# Patient Record
Sex: Female | Born: 1960 | Race: White | Hispanic: No | Smoking: Former smoker
Health system: Southern US, Community
[De-identification: ages and names within clinical notes are randomized; demographics above are authoritative.]

## PROBLEM LIST (undated history)

## (undated) DIAGNOSIS — E7800 Pure hypercholesterolemia, unspecified: Secondary | ICD-10-CM

## (undated) DIAGNOSIS — G43909 Migraine, unspecified, not intractable, without status migrainosus: Secondary | ICD-10-CM

## (undated) DIAGNOSIS — M79606 Pain in leg, unspecified: Secondary | ICD-10-CM

## (undated) DIAGNOSIS — F410 Panic disorder [episodic paroxysmal anxiety] without agoraphobia: Secondary | ICD-10-CM

## (undated) DIAGNOSIS — K219 Gastro-esophageal reflux disease without esophagitis: Secondary | ICD-10-CM

## (undated) DIAGNOSIS — I8390 Asymptomatic varicose veins of unspecified lower extremity: Secondary | ICD-10-CM

## (undated) DIAGNOSIS — F32A Depression, unspecified: Secondary | ICD-10-CM

## (undated) DIAGNOSIS — K449 Diaphragmatic hernia without obstruction or gangrene: Secondary | ICD-10-CM

## (undated) DIAGNOSIS — F329 Major depressive disorder, single episode, unspecified: Secondary | ICD-10-CM

## (undated) DIAGNOSIS — F419 Anxiety disorder, unspecified: Secondary | ICD-10-CM

## (undated) DIAGNOSIS — D649 Anemia, unspecified: Secondary | ICD-10-CM

## (undated) HISTORY — PX: HERNIA REPAIR: SHX51

## (undated) HISTORY — DX: Pain in leg, unspecified: M79.606

## (undated) HISTORY — DX: Anemia, unspecified: D64.9

## (undated) HISTORY — DX: Asymptomatic varicose veins of unspecified lower extremity: I83.90

## (undated) HISTORY — DX: Migraine, unspecified, not intractable, without status migrainosus: G43.909

## (undated) HISTORY — DX: Pure hypercholesterolemia, unspecified: E78.00

## (undated) HISTORY — PX: TUBAL LIGATION: SHX77

---

## 1997-09-04 ENCOUNTER — Emergency Department (HOSPITAL_COMMUNITY): Admission: EM | Admit: 1997-09-04 | Discharge: 1997-09-04 | Payer: Self-pay | Admitting: Emergency Medicine

## 2001-04-26 ENCOUNTER — Other Ambulatory Visit: Admission: RE | Admit: 2001-04-26 | Discharge: 2001-04-26 | Payer: Self-pay | Admitting: Internal Medicine

## 2001-08-11 ENCOUNTER — Encounter: Payer: Self-pay | Admitting: Internal Medicine

## 2001-08-11 ENCOUNTER — Ambulatory Visit (HOSPITAL_COMMUNITY): Admission: RE | Admit: 2001-08-11 | Discharge: 2001-08-11 | Payer: Self-pay | Admitting: Internal Medicine

## 2004-02-08 ENCOUNTER — Ambulatory Visit (HOSPITAL_COMMUNITY): Admission: RE | Admit: 2004-02-08 | Discharge: 2004-02-08 | Payer: Self-pay | Admitting: Family Medicine

## 2006-06-02 HISTORY — PX: COLONOSCOPY: SHX174

## 2006-06-02 HISTORY — PX: ESOPHAGOGASTRODUODENOSCOPY: SHX1529

## 2008-01-27 ENCOUNTER — Ambulatory Visit: Payer: Self-pay | Admitting: Internal Medicine

## 2008-02-08 ENCOUNTER — Ambulatory Visit (HOSPITAL_COMMUNITY): Admission: RE | Admit: 2008-02-08 | Discharge: 2008-02-08 | Payer: Self-pay | Admitting: Internal Medicine

## 2008-02-08 ENCOUNTER — Ambulatory Visit: Payer: Self-pay | Admitting: Internal Medicine

## 2008-02-08 HISTORY — PX: ESOPHAGOGASTRODUODENOSCOPY: SHX1529

## 2008-02-10 ENCOUNTER — Ambulatory Visit (HOSPITAL_COMMUNITY): Admission: RE | Admit: 2008-02-10 | Discharge: 2008-02-10 | Payer: Self-pay | Admitting: Internal Medicine

## 2008-06-02 HISTORY — PX: HIATAL HERNIA REPAIR: SHX195

## 2009-01-18 ENCOUNTER — Other Ambulatory Visit: Payer: Self-pay | Admitting: Emergency Medicine

## 2009-01-18 ENCOUNTER — Inpatient Hospital Stay (HOSPITAL_COMMUNITY): Admission: RE | Admit: 2009-01-18 | Discharge: 2009-01-21 | Payer: Self-pay | Admitting: *Deleted

## 2009-01-18 ENCOUNTER — Ambulatory Visit: Payer: Self-pay | Admitting: *Deleted

## 2009-02-08 ENCOUNTER — Other Ambulatory Visit: Admission: RE | Admit: 2009-02-08 | Discharge: 2009-02-08 | Payer: Self-pay | Admitting: Obstetrics and Gynecology

## 2009-02-14 ENCOUNTER — Ambulatory Visit (HOSPITAL_COMMUNITY): Admission: RE | Admit: 2009-02-14 | Discharge: 2009-02-14 | Payer: Self-pay | Admitting: Obstetrics & Gynecology

## 2010-03-05 ENCOUNTER — Ambulatory Visit (HOSPITAL_COMMUNITY): Admission: RE | Admit: 2010-03-05 | Discharge: 2010-03-05 | Payer: Self-pay | Admitting: Internal Medicine

## 2010-04-09 ENCOUNTER — Ambulatory Visit (HOSPITAL_COMMUNITY): Admission: RE | Admit: 2010-04-09 | Discharge: 2010-04-09 | Payer: Self-pay | Admitting: Obstetrics & Gynecology

## 2010-04-18 ENCOUNTER — Other Ambulatory Visit: Admission: RE | Admit: 2010-04-18 | Discharge: 2010-04-18 | Payer: Self-pay | Admitting: Obstetrics and Gynecology

## 2010-06-23 ENCOUNTER — Encounter: Payer: Self-pay | Admitting: Internal Medicine

## 2010-09-07 LAB — RAPID URINE DRUG SCREEN, HOSP PERFORMED
Amphetamines: NOT DETECTED
Barbiturates: NOT DETECTED
Benzodiazepines: POSITIVE — AB
Cocaine: NOT DETECTED
Opiates: NOT DETECTED
Tetrahydrocannabinol: NOT DETECTED

## 2010-09-07 LAB — SALICYLATE LEVEL: Salicylate Lvl: <4 mg/dL (ref 2.8–20.0)

## 2010-09-07 LAB — CBC
HCT: 42.6 % (ref 36.0–46.0)
Hemoglobin: 14.7 g/dL (ref 12.0–15.0)
MCHC: 34.5 g/dL (ref 30.0–36.0)
MCV: 90.6 fL (ref 78.0–100.0)
Platelets: 153 10*3/uL (ref 150–400)
RBC: 4.71 MIL/uL (ref 3.87–5.11)
RDW: 14.3 % (ref 11.5–15.5)
WBC: 4.3 10*3/uL (ref 4.0–10.5)

## 2010-09-07 LAB — BASIC METABOLIC PANEL
BUN: 7 mg/dL (ref 6–23)
CO2: 30 mEq/L (ref 19–32)
Chloride: 111 mEq/L (ref 96–112)
Creatinine, Ser: 0.93 mg/dL (ref 0.4–1.2)
Glucose, Bld: 121 mg/dL — ABNORMAL HIGH (ref 70–99)

## 2010-09-07 LAB — DIFFERENTIAL
Basophils Relative: 1 % (ref 0–1)
Eosinophils Absolute: 0 10*3/uL (ref 0.0–0.7)
Lymphocytes Relative: 46 % (ref 12–46)
Monocytes Relative: 5 % (ref 3–12)
Neutrophils Relative %: 47 % (ref 43–77)

## 2010-09-07 LAB — ETHANOL: Alcohol, Ethyl (B): 139 mg/dL — ABNORMAL HIGH (ref 0–10)

## 2010-10-15 NOTE — Op Note (Signed)
NAMERAFAELLA, KOLE                  ACCOUNT NO.:  [id number]   MEDICAL RECORD NO.:  [id number]          PATIENT TYPE:  AMB   LOCATION:  DAY                           FACILITY:  [facility]   PHYSICIAN:  R. Roetta Sessions, M.D. DATE OF BIRTH:  05-07-1961   DATE OF PROCEDURE:  02/08/2008  DATE OF DISCHARGE:                               OPERATIVE REPORT   INDICATIONS FOR PROCEDURE:  A 50 year old lady with a history of chronic  gastroesophageal reflux disease, atypical chest pain, she denies  dysphagia, history of a hiatal hernia.  Risks, benefits, and  alternatives have been reviewed previously and again at the bedside.  Questions answered and all parties agreeable.   PROCEDURE NOTE:  O2 saturation, blood pressure, pulse, and respirations  were monitored throughout the entire procedure.  Conscious sedation,  Versed 8 mg IV, Demerol 200 mg IV, Phenergan 12.5 mg IV diluted slow IV  push to augment conscious sedation.  Cetacaine spray for topical  pharyngeal anesthesia.   INSTRUMENT:  Pentax video chip system.   FINDINGS:  Examination of the tubular esophagus revealed a prominent  Schatzki's ring, otherwise esophageal mucosa appeared normal.  EG  junction easily traversed into the stomach.  All gastric cavity was  emptied and insufflated well with air.  Thorough examination of the  gastric mucosa including retroflexed view of the proximal stomach,  esophagogastric junction was undertaken.  The patient had a large  diaphragmatic hernia.  The mucosa in the hernia sac appeared normal as  did the mucosa down into the antrum and the remainder of the gastric  mucosa.  There was a second area of gastric mucosal herniation up behind  the diaphragmatic hiatus which revealed a rather large compartment with  cardiac pulsations on the other side of this segment of herniated  gastric pouch consistent with paraesophageal hernia.  Pylorus is patent,  easily traversed.  Examination of the bulb and the second  portion  revealed no abnormalities.   THERAPEUTIC/DIAGNOSTIC MANEUVERS PERFORMED:  None.   The patient tolerated the procedure well.   FINDINGS:  1. Normal esophageal mucosa aside from Schatzki's ring not manipulated      (the patient not dysphagic).  2. 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   I suspect Ms. Mckissic upper abdominal and chest pain are secondary to her  hernias.   RECOMMENDATIONS:  We will proceed with an upper GI series to confirm  today's findings.  I feel she will likely need to undergo surgical  repair of these hernias in the near future, but further recommendations  to follow pending review of the upper GI series.      Jonathon Bellows, M.D.  Electronically Signed     RMR/MEDQ  D:  02/08/2008  T:  02/09/2008  Job:  [id number]   cc:   Kirstie Peri, MD  Fax: [phone]

## 2010-10-15 NOTE — Consult Note (Signed)
NAMEJACQLYN, MAROLF                  ACCOUNT NO.:  [id number]   MEDICAL RECORD NO.:  [id number]          PATIENT TYPE:  AMB   LOCATION:  DAY                           FACILITY:  [facility]   PHYSICIAN:  R. Roetta Sessions, M.D. DATE OF BIRTH:  11/12/1950   DATE OF CONSULTATION:  DATE OF DISCHARGE:                                 CONSULTATION   REQUESTING PHYSICIAN:  Kirstie Peri, MD.   REASON FOR CONSULTATION:  Atypical chest pain.   HISTORY OF PRESENT ILLNESS:  Ms. Meas is a 50 year old Caucasian  female.  For the last 2 to 3 months, she has had a pressure in her left  upper anterior chest.  She describes as a discomfort.  She feels as  though it may be muscle spasms.  It does radiate up to her left  shoulder.  She feels that she needs to massage her epigastrium.  She has  a lot of borborygmus.  She tells me she feels movement in her abdomen.  She has history of hiatal hernia.  She notes that her chest pressure and  pain is worse when she swallows solid foods, especially foods like  chicken.  Her weight has remained stable.  She denies any heartburn and  indigestion so long as she takes her Nexium 40 mg b.i.d.  She has been  on this regimen earlier this year, prior to this she was on once daily  therapy.  She denies any nausea or vomiting.  She denies any anorexia.  Her bowel movements has been soft and brown.  She denies any diarrhea or  constipation.  She denies rectal bleeding or melena.  She did have an  abdominal ultrasound on January 26, 2008.  Her gallbladder was normal.  There is no evidence of cholelithiasis.  Common bile duct was normal.  Liver was normal.  She had an EGD and colonoscopy by Dr. Karilyn Cota in June  2008.  She was found to have superficial ulceration at the GE junction  and large hiatal hernia with dependent segment on the left side with  some food debris and coffee-ground material, swollen and erythematous  folds, and few antral erosions.  Her colonoscopy was normal.   These  tests were done at the time she was hospitalized with profound iron  deficiency anemia.  She had a hemoglobin of 4.2.   PAST MEDICAL AND SURGICAL HISTORY:  Profound iron deficiency anemia  requiring hospitalization and transfusion, EGD and colonoscopy by Dr.  Karilyn Cota as described above in 2008, bursitis of left leg, depression,  anxiety, and insomnia.   CURRENT MEDICATIONS:  1. Darvocet-N 100 b.i.d.  2. Poly iron once daily.  3. Nexium 40 mg b.i.d.  4. Cymbalta 60 mg daily.  5. Clonazepam 1 mg t.i.d.  6. Trazodone 50 mg at bedtime.  7. Provera 10 mg b.i.d. for 10 days for vaginal bleeding.   ALLERGIES:  No known drug allergies.   FAMILY HISTORY:  There is no known family history of colorectal  carcinoma or chronic GI problems.  Mother is relatively healthy.  Father  deceased secondary to tractor  accident.  She has several healthy  siblings.   SOCIAL HISTORY:  Ms. Rego is single.  She has 3 children, 1 is grown, 1  resides in a group home, and the other is at home with her.  She is  disabled.  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  She has remote  history of tobacco use.  She denies any drug use.  She denies any  alcohol use.   REVIEW OF SYSTEMS:  See HPI.  GYN:  She has had irregular vaginal  bleeding, is quite heavy at times.  She has been started on Provera  through her physician.  Her last menstrual period was around January 12, 2008.   PHYSICAL EXAMINATION:  VITAL SIGNS:  Weight 217 pounds, height 67  inches, temperature 98.3, blood pressure 114/72, and pulse 84.  GENERAL:  She is an obese Caucasian female who is alert, oriented,  pleasant, and cooperative, in no acute distress.  HEENT:  Sclerae clear and nonicteric.  Conjunctivae pink.  Oropharynx  pink and moist without any lesions.  NECK:  Supple without mass or thyromegaly.  CHEST:  Heart regular rate and rhythm.  Normal S1 and S2 without any  murmurs, clicks, rubs, or gallops.  LUNGS:  Clear auscultation  bilaterally.  ABDOMEN:  Protuberant with positive bowel sounds x4.  No bruits  auscultated.  Soft, nontender, and nondistended without palpable mass or  hepatosplenomegaly.  No rebound, tenderness, or guarding.  EXTREMITIES:  Without clubbing or edema.   IMPRESSION:  Ms. Deringer is a 50 year old Caucasian female.  She has a  history of chronic gastroesophageal reflux disease.  She is having  atypical chest pain.  Last EGD showed a hiatal hernia.  Also, she was  noted to have ulceration of the GE junction and coffee-ground material  with swollen and erythematous folds of her large hiatal hernia.  At that  time, she was hospitalized with profound anemia.  I suspect that her  hiatal hernia may be symptomatic and she may also have persistent ulcer  in this area as well as she is going to require further evaluation.   PLAN:  1. We will check CBC today.  2. We will schedule EGD with Dr. Jena Gauss in the near future.  I      discussed the procedure including risks and benefits included but      not limited to bleeding, infection, perforation, and drug reaction.      She agrees and the planned consent will be obtained.  3. She should continue Nexium 40 mg b.i.d.   Thank you Dr. Sherryll Burger for allowing [place] to participate in the care of Ms.  Pevey.      Lorenza Burton, N.P.      Jonathon Bellows, M.D.  Electronically Signed    KJ/MEDQ  D:  01/27/2008  T:  01/28/2008  Job:  [id number]

## 2010-10-15 NOTE — H&P (Signed)
[name], Barrera NO.:  [id number]   MEDICAL RECORD NO.:  [id number]         PATIENT TYPE:  BIPS   LOCATION:                                FACILITY:  [facility]   PHYSICIAN:  Geoffery Lyons, M.D.      DATE OF BIRTH:  Dec 23, 1960   DATE OF ADMISSION:  01/18/2009  DATE OF DISCHARGE:  01/21/2009                       PSYCHIATRIC ADMISSION ASSESSMENT   A 50 year old female voluntarily admitted on January 18, 2009.   HISTORY OF PRESENT ILLNESS:  The patient was assessed in the emergency  department after being seen for an overdose and intoxication.  The  patient apparently had taken 50 mg of Phenergan and 300 mg of trazodone,  stating that she had a few drinks.  She has a history of alcohol use and  states that she just relapsed recently.  She reports stressors going on  in her life but adamantly denies any suicidal thoughts.   PAST PSYCHIATRIC HISTORY:  This is the first admission to the [facility].  Sees Dr. Thomasena Edis at [facility] for her depression and  bipolar disorder.  Has had no other psychiatric admissions.   SOCIAL HISTORY:  She is a 50 year old female, currently in a  relationship, and has a 57 year old son.  Is on disability.   FAMILY HISTORY:  None.   ALCOHOL AND DRUG HISTORY:  The patient has a history of alcohol use.  Was prescribed Campral.  Denies any substance use but was intoxicated on  admission to the emergency department with a blood alcohol level of 229.   PRIMARY CARE Yatzari Jonsson:  Dr. Sherryll Burger.   MEDICAL PROBLEMS:  History of acid reflux.   MEDICATIONS:  The patient lists:  1. Trazodone 50 mg at bedtime.  2. Nexium 40 mg daily.  3. Klonopin 1 mg t.i.d.  4. Darvocet twice a day p.r.n. for pain.  5. Campral daily.  6. Cymbalta 60 mg daily.   DRUG ALLERGIES:  ASPIRIN.   PHYSICAL EXAMINATION:  This is a middle-aged female, resting in bed with  chief complaint of being tired.  She appears somewhat unkempt and does  appear sleepy.  She  offers no complaints; however, emergency department  records indicate the patient was intoxicated with her breath smelling of  alcohol.   LABORATORY DATA:  Shows alcohol level of 229.  Acetaminophen level of  less than 10.  Potassium 3.1.  Salicylate level less than 4.  Urine drug  screen is positive for benzodiazepines.  CBC within normal limits, and  pregnancy test is negative.   MENTAL STATUS EXAM:  The patient again is in the bed, sleepy but  agreeable to talk with good eye contact.  Speech is clear, normal pace  and tone.  The patient's mood is neutral.  Affect:  She smiles and is  pleasant, agreeable to gathering more history and clarifying situations.  Thought process coherent, goal directed.  Denies any suicidal thoughts.  Cognitive function intact.  Her memory appears intact.  Judgment and  insight fair.   ASSESSMENT:  AXIS I:  Depressive disorder.  Alcohol abuse, rule out  dependence.  AXIS II:  Deferred.  AXIS III:  Acid reflux.  AXIS IV:  Deferred at this time.  AXIS V:  Current is 35-40.   PLAN:  1. Contract for safety.  2. Stabilize mood and thinking.  3. We will discontinue her Klonopin which was discussed with the      patient.  Will put patient on Librium protocol and encourage      fluids.  The patient will be in the dual diagnosis program.  We      will identify her support and clarify safety issues at home.  Will      also contact her pharmacy to clarify other medications.  We will      continue with her Cymbalta.  4. Her tentative length of stay at this time is 3-4 days.      Landry Corporal, N.P.      Geoffery Lyons, M.D.  Electronically Signed    JO/MEDQ  D:  01/19/2009  T:  01/19/2009  Job:  [id number]

## 2010-12-10 DIAGNOSIS — R072 Precordial pain: Secondary | ICD-10-CM

## 2011-01-03 LAB — CBC
HCT: 43 %
Hemoglobin: 14.5 g/dL (ref 12.0–16.0)

## 2011-01-03 LAB — COMPREHENSIVE METABOLIC PANEL
AST: 20 U/L
Albumin: 4.2
Calcium: 9.8 mg/dL
Creat: 1
Potassium: 4.3 mmol/L

## 2011-01-22 ENCOUNTER — Emergency Department (HOSPITAL_COMMUNITY)
Admission: EM | Admit: 2011-01-22 | Discharge: 2011-01-22 | Disposition: A | Discharge status: Home / Self Care | Payer: Medicaid Other | Attending: Emergency Medicine | Admitting: Emergency Medicine

## 2011-01-22 ENCOUNTER — Encounter: Payer: Self-pay | Admitting: *Deleted

## 2011-01-22 ENCOUNTER — Other Ambulatory Visit: Payer: Self-pay

## 2011-01-22 ENCOUNTER — Emergency Department (HOSPITAL_COMMUNITY): Payer: Medicaid Other

## 2011-01-22 DIAGNOSIS — F410 Panic disorder [episodic paroxysmal anxiety] without agoraphobia: Secondary | ICD-10-CM | POA: Insufficient documentation

## 2011-01-22 DIAGNOSIS — K449 Diaphragmatic hernia without obstruction or gangrene: Secondary | ICD-10-CM | POA: Insufficient documentation

## 2011-01-22 DIAGNOSIS — Z79899 Other long term (current) drug therapy: Secondary | ICD-10-CM | POA: Insufficient documentation

## 2011-01-22 DIAGNOSIS — F341 Dysthymic disorder: Secondary | ICD-10-CM | POA: Insufficient documentation

## 2011-01-22 DIAGNOSIS — R079 Chest pain, unspecified: Secondary | ICD-10-CM | POA: Insufficient documentation

## 2011-01-22 DIAGNOSIS — Z87891 Personal history of nicotine dependence: Secondary | ICD-10-CM | POA: Insufficient documentation

## 2011-01-22 DIAGNOSIS — E7800 Pure hypercholesterolemia, unspecified: Secondary | ICD-10-CM | POA: Insufficient documentation

## 2011-01-22 DIAGNOSIS — R0789 Other chest pain: Secondary | ICD-10-CM

## 2011-01-22 HISTORY — DX: Major depressive disorder, single episode, unspecified: F32.9

## 2011-01-22 HISTORY — DX: Anxiety disorder, unspecified: F41.9

## 2011-01-22 HISTORY — DX: Depression, unspecified: F32.A

## 2011-01-22 HISTORY — DX: Diaphragmatic hernia without obstruction or gangrene: K44.9

## 2011-01-22 HISTORY — DX: Panic disorder (episodic paroxysmal anxiety): F41.0

## 2011-01-22 LAB — CBC
Hemoglobin: 14.5 g/dL (ref 12.0–15.0)
MCHC: 33.6 g/dL (ref 30.0–36.0)
RBC: 4.64 MIL/uL (ref 3.87–5.11)
WBC: 8.6 10*3/uL (ref 4.0–10.5)

## 2011-01-22 LAB — BASIC METABOLIC PANEL
CO2: 29 mEq/L (ref 19–32)
Chloride: 100 mEq/L (ref 96–112)
GFR calc non Af Amer: >60 mL/min (ref >60)
Glucose, Bld: 94 mg/dL (ref 70–99)
Potassium: 3.6 mEq/L (ref 3.5–5.1)
Sodium: 139 mEq/L (ref 135–145)

## 2011-01-22 LAB — CARDIAC PANEL(CRET KIN+CKTOT+MB+TROPI)
Relative Index: INVALID (ref 0.0–2.5)
Troponin I: <0.3 ng/mL (ref <0.30)

## 2011-01-22 NOTE — ED Notes (Signed)
Pt c/o central chest pain that started 45 mins pta; pt has hx of hiatal hernia and is unsure if this pain is related

## 2011-01-22 NOTE — ED Notes (Signed)
Pt self ambulated out with a steady gait

## 2011-01-22 NOTE — ED Notes (Signed)
Pt arrived via EMS. Pt reports at present time she is not experiencing CP.  States that she was sitting on the couch at home, resting, when she experienced sudden sharp pain in mid chest, some nausea and SOB.  No distress noted at this time.

## 2011-01-22 NOTE — ED Notes (Signed)
Call placed to pt's emergency contact person listed. Rebecca Barrera.  Left message stating that pt was to be discharged.  Pt arrived via EMS, but has no medical need at this time requiring transportation back by ambulance.

## 2011-01-22 NOTE — ED Provider Notes (Signed)
Scribed for Felisa Bonier, MD, the patient was seen in room 14. This chart was scribed by Jannette Fogo. This patient's care was started at 21:02.   CSN: [id number] Arrival date & time: 01/22/2011  7:23 PM  Chief Complaint  Patient presents with  . Chest Pain   HPI Rebecca Barrera is a 50 y.o. female with a history of hiatal hernia and high cholesterol who presents to the Emergency Department complaining of substernal chest pain with associated nausea. Patient states she developed chest "tightness" during rest, approximately 2 hours after eating. She denies any pain radiation but reports associated nausea and difficulty breathing due to tightness. Chest pain lasted for ~45 minutes and "it was unbearable" but en route to the ED it resolved and patient is currently asymptomatic. She denies any associated diaphoresis or vomiting, and pain was not exacerbated by swallowing or relieved by positioning.  Patient reports a history of similar symptoms with hiatal hernia in the past requiring repair. She has an appointment with her GI next week. She also reports a negative stress test ~1 month ago. She denies a history of hypertension, cardiac disease, diabetes mellitus, or tobacco use. Reports cardiac disease her in father who possibly died from an AMI. There are no other associated symptoms and no other alleviating or aggravating factors.    HPI ELEMENTS:  Location: chest   Onset: prior to arrival Duration: 45 minutes  Timing: constant but resolved   Quality: "tightness"  Not sharp Modifying factors: No exacerbated by swallowing. Not relieved by positioning  Context: as above  Associated symptoms: nausea    Past Medical History  Diagnosis Date  . Anxiety   . Depression   . Hiatal hernia   . Panic attacks   High cholesterol  Past Surgical History  Procedure Date  . Hiatal hernia repair   . Tubal ligation    MEDICATIONS:  Previous Medications   CHOLECALCIFEROL (VITAMIN D) 1000 UNITS  TABLET    Take 1,000 Units by mouth daily.     CLONAZEPAM (KLONOPIN) 1 MG TABLET    Take 1 mg by mouth 2 (two) times daily.     DULOXETINE (CYMBALTA) 30 MG CAPSULE    Take 60 mg by mouth daily.    ESOMEPRAZOLE (NEXIUM) 40 MG CAPSULE    Take 40 mg by mouth daily before breakfast.     GEMFIBROZIL (LOPID) 600 MG TABLET    Take 600 mg by mouth 2 (two) times daily.     MEDROXYPROGESTERONE (PROVERA) 10 MG TABLET    Take 10 mg by mouth daily. For 10 days at the beginning of each Month   PROBIOTIC PRODUCT (HEALTHY COLON PO)    Take 1 tablet by mouth daily.     PROMETHAZINE (PHENERGAN) 25 MG TABLET    Take 25 mg by mouth every 6 (six) hours as needed. For nausea   SUMATRIPTAN (IMITREX) 100 MG TABLET    Take 100 mg by mouth as needed.     TRAMADOL (ULTRAM) 50 MG TABLET    Take 50 mg by mouth every 6 (six) hours as needed. For pain   TRAZODONE (DESYREL) 100 MG TABLET    Take 100 mg by mouth at bedtime.     VALACYCLOVIR (VALTREX) 1000 MG TABLET    Take 1,000 mg by mouth daily. If needed for flare(s)     ALLERGIES:  Allergies as of 01/22/2011 - Review Complete 01/22/2011  Allergen Reaction Noted  . Aspirin Other (See Comments) 01/22/2011  .  Vicodin (hydrocodone-acetaminophen)  01/22/2011    FAMILY HISTORY:  +Cardiac disease her in father who possibly died from an AMI after a tractor accident   SOCIAL HISTORY: Arrives alone  Denies tobacco use History  Substance Use Topics  . Smoking status: Former Games developer  . Smokeless tobacco: Not on file  . Alcohol Use: No   Review of Systems  Cardiovascular: Positive for chest pain.  All other systems reviewed and are negative.   Physical Exam  BP 118/59  Pulse 80  Temp(Src) 97.6 F (36.4 C) (Oral)  Resp 21  Ht 5\' 7"  (1.702 m)  Wt 180 lb (81.647 kg)  BMI 28.19 kg/m2  SpO2 95%  Physical Exam  Nursing note and vitals reviewed. Constitutional: She is oriented to person, place, and time. She appears well-developed and well-nourished. No distress.        Resting comfortably on gurney   HENT:  Head: Normocephalic and atraumatic.  Mouth/Throat: Oropharynx is clear and moist.  Eyes: Conjunctivae are normal. Pupils are equal, round, and reactive to light.  Neck: Normal range of motion. Neck supple. No JVD present. No tracheal deviation (trachea midline ) present.  Cardiovascular: Normal rate, regular rhythm, normal heart sounds and intact distal pulses.  Exam reveals no gallop and no friction rub.   No murmur heard. Pulmonary/Chest: Effort normal and breath sounds normal. No respiratory distress. She has no wheezes. She has no rales.       Lung sounds clear bilaterally.   Abdominal: Soft. Bowel sounds are normal. There is no tenderness.  Musculoskeletal: Normal range of motion. She exhibits no edema (no pedal edema) and no tenderness.  Neurological: She is alert and oriented to person, place, and time. She has normal strength. Coordination abnormal.  Skin: Skin is warm and dry. She is not diaphoretic.    Procedures  OTHER DATA REVIEWED: Nursing notes, vital signs, and past medical records reviewed.  DIAGNOSTIC STUDIES: Oxygen Saturation is 100% on room air, normal by my interpretation.    EKG  Date: 01/22/2011  Rate: 79  Rhythm: normal sinus rhythm  QRS Axis: normal  Intervals: normal  ST/T Wave abnormalities: normal  Conduction Disutrbances:none  Narrative Interpretation: NSR  Old EKG Reviewed: none available  LABS / RADIOLOGY:  Results for orders placed during the hospital encounter of 01/22/11  CARDIAC PANEL(CRET KIN+CKTOT+MB+TROPI)      Component Value Range   Total CK PENDING  7 - 177 (U/L)   CK, MB 1.9  0.3 - 4.0 (ng/mL)   Troponin I <0.30  <0.30 (ng/mL)   Relative Index PENDING  0.0 - 2.5   CBC      Component Value Range   WBC 8.6  4.0 - 10.5 (K/uL)   RBC 4.64  3.87 - 5.11 (MIL/uL)   Hemoglobin 14.5  12.0 - 15.0 (g/dL)   HCT 16.1  09.6 - 04.5 (%)   MCV 93.1  78.0 - 100.0 (fL)   MCH 31.3  26.0 - 34.0 (pg)    MCHC 33.6  30.0 - 36.0 (g/dL)   RDW 40.9  81.1 - 91.4 (%)   Platelets 188  150 - 400 (K/uL)  BASIC METABOLIC PANEL      Component Value Range   Sodium 139  135 - 145 (mEq/L)   Potassium 3.6  3.5 - 5.1 (mEq/L)   Chloride 100  96 - 112 (mEq/L)   CO2 29  19 - 32 (mEq/L)   Glucose, Bld 94  70 - 99 (mg/dL)   BUN 15  6 - 23 (mg/dL)   Creatinine, Ser 1.61  0.50 - 1.10 (mg/dL)   Calcium 09.6  8.4 - 10.5 (mg/dL)   GFR calc non Af Amer >60  >60 (mL/min)   GFR calc Af Amer >60  >60 (mL/min)   CXR: 1 View; Interpreted by Radiologist Dr. Maisie Fus L. D'ALESSIO and reviewed by me:  No acute finding.  Hiatal hernia.   ED COURSE / COORDINATION OF CARE: 21:05  - Patient evaluated by ED physician; EKG is within normal limits, cardiac labs still pending.   MDM: Atypical chest pain, gastrointestinal chest pain, pain from hiatal hernia, myocardial infarction, musculoskeletal chest wall pain all entertained as potential etiologies amongst others in the patient's differential diagnosis. At this time the patient is chest pain-free, and reports that the pain is the same as previous times when it has been attributed to her hiatal hernia. Additionally, she informs me that she had a negative stress test one month ago which is strong reassurance against coronary artery disease. Furthermore the chest pain is atypical in that he came in on rest and was not worsened by exertion, and went away on its own spontaneously. My impression at this time is that of atypical chest pain, likely gastrointestinal in etiology. Her cardiac markers are negative, and her EKG is negative, her chest x-ray is normal in this otherwise low-risk patient for coronary artery disease. I will discharge her home for outpatient followup as needed.  IMPRESSION: Diagnoses that have been ruled out:  Diagnoses that are still under consideration:  Final diagnoses:    PLAN:  Home Referral primary care physician The patient is to return the emergency  department if there is any worsening of symptoms. I have reviewed the discharge instructions with the patient who states her understanding of and agreement with the plan of care.  CONDITION ON DISCHARGE: Good  MEDICATIONS GIVEN IN THE E.D.  Medications  esomeprazole (NEXIUM) 40 MG capsule (not administered)  traZODone (DESYREL) 100 MG tablet (not administered)  promethazine (PHENERGAN) 25 MG tablet (not administered)  gemfibrozil (LOPID) 600 MG tablet (not administered)  DULoxetine (CYMBALTA) 30 MG capsule (not administered)  traMADol (ULTRAM) 50 MG tablet (not administered)  clonazePAM (KLONOPIN) 1 MG tablet (not administered)  medroxyPROGESTERone (PROVERA) 10 MG tablet (not administered)  SUMAtriptan (IMITREX) 100 MG tablet (not administered)  valACYclovir (VALTREX) 1000 MG tablet (not administered)  Probiotic Product (HEALTHY COLON PO) (not administered)  cholecalciferol (VITAMIN D) 1000 UNITS tablet (not administered)    DISCHARGE MEDICATIONS: New Prescriptions   No medications on file   I personally performed the services described in this documentation, which was scribed in my presence. The recorded information has been reviewed and considered.      Felisa Bonier, MD 01/22/11 [phone]

## 2011-01-27 ENCOUNTER — Other Ambulatory Visit (HOSPITAL_COMMUNITY): Payer: Self-pay | Admitting: Internal Medicine

## 2011-01-27 DIAGNOSIS — Z139 Encounter for screening, unspecified: Secondary | ICD-10-CM

## 2011-01-29 ENCOUNTER — Encounter: Payer: Self-pay | Admitting: Gastroenterology

## 2011-01-29 ENCOUNTER — Ambulatory Visit (INDEPENDENT_AMBULATORY_CARE_PROVIDER_SITE_OTHER): Payer: Medicaid Other | Admitting: Gastroenterology

## 2011-01-29 VITALS — BP 126/80 | HR 72 | Temp 97.8°F | Ht 67.0 in | Wt 188.0 lb

## 2011-01-29 DIAGNOSIS — K449 Diaphragmatic hernia without obstruction or gangrene: Secondary | ICD-10-CM

## 2011-01-29 DIAGNOSIS — Z8719 Personal history of other diseases of the digestive system: Secondary | ICD-10-CM | POA: Insufficient documentation

## 2011-01-29 DIAGNOSIS — Z9889 Other specified postprocedural states: Secondary | ICD-10-CM

## 2011-01-29 NOTE — Progress Notes (Signed)
Primary Care Physician:  Kirstie Peri, MD  Primary Gastroenterologist:    Chief Complaint  Patient presents with  . Hiatal Hernia    HPI:  Rebecca Barrera is a 50 y.o. female here for reevaluation of hiatal hernia. She had paraesophageal hernia repair in 2010 by Dr. Francee Gentile. Has been almost two years since she had f/u with him. UGI showed "recurrent hernia" per patient. According to the patient, it was not felt she needed to do anything else at that point. Went to ED recently. 01/22/11 pain and pressure in chest. Thought it was heart attack but ED told her it was hiatal hernia. Heart checked out okay. Stress test already done previously, good. No frequent heartburn or acid reflux. Has been on Nexium for awhile. Started post-op. BM good. No melena, brbpr. No dysphagia/odynophagia. Chest pressure noted every couple of weeks. Patient has lost over 25 pounds since switching to diet softdrinks.  Current Outpatient Prescriptions  Medication Sig Dispense Refill  . cholecalciferol (VITAMIN D) 1000 UNITS tablet Take 1,000 Units by mouth daily.        . clonazePAM (KLONOPIN) 1 MG tablet Take 1 mg by mouth 2 (two) times daily.        . DULoxetine (CYMBALTA) 30 MG capsule Take 60 mg by mouth daily.       Marland Kitchen esomeprazole (NEXIUM) 40 MG capsule Take 40 mg by mouth daily before breakfast.        . gemfibrozil (LOPID) 600 MG tablet Take 600 mg by mouth 2 (two) times daily.        . medroxyPROGESTERone (PROVERA) 10 MG tablet Take 10 mg by mouth daily. For 10 days at the beginning of each Month      . Probiotic Product (HEALTHY COLON PO) Take 1 tablet by mouth daily.        . promethazine (PHENERGAN) 25 MG tablet Take 25 mg by mouth every 6 (six) hours as needed. For nausea      . SUMAtriptan (IMITREX) 100 MG tablet Take 100 mg by mouth as needed.        . traMADol (ULTRAM) 50 MG tablet Take 50 mg by mouth every 6 (six) hours as needed. For pain      . traZODone (DESYREL) 100 MG tablet Take 100 mg by mouth at  bedtime.        . valACYclovir (VALTREX) 1000 MG tablet Take 1,000 mg by mouth daily. If needed for flare(s)        Allergies as of 01/29/2011 - Review Complete 01/29/2011  Allergen Reaction Noted  . Aspirin Other (See Comments) 01/22/2011  . Vicodin (hydrocodone-acetaminophen)  01/22/2011    Past Medical History  Diagnosis Date  . Anxiety   . Depression   . Hiatal hernia   . Panic attacks     Past Surgical History  Procedure Date  . Hiatal hernia repair 06/2008    paraesophageal hernia repair  . Tubal ligation   . Esophagogastroduodenoscopy 2008    Dr. Rehman--> superficial ulceration at the GE junction a large hiatal hernia with dependent segment on the left side with food debris and coffee-ground material, swollen and erythematous folds, few antral erosions.  . Colonoscopy 2008    Dr. Karilyn Cota    Family History  Problem Relation Age of Onset  . Colon cancer Neg Hx     History   Social History  . Marital Status: Divorced    Spouse Name: N/A    Number of Children: 3  .  Years of Education: N/A   Occupational History  . Disabled    Social History Main Topics  . Smoking status: Former Games developer  . Smokeless tobacco: Not on file  . Alcohol Use: No  . Drug Use: No  . Sexually Active: Yes    Birth Control/ Protection: Surgical   Other Topics Concern  . Not on file   Social History Narrative  . No narrative on file      ROS:  General: Negative for anorexia, weight loss, fever, chills, fatigue, weakness. Eyes: Negative for vision changes.  ENT: Negative for hoarseness, difficulty swallowing , nasal congestion. CV: Negative for angina, palpitations, dyspnea on exertion, peripheral edema. See HPI.  Respiratory: Negative for dyspnea at rest, dyspnea on exertion, cough, sputum, wheezing.  GI: See history of present illness. GU:  Negative for dysuria, hematuria, urinary incontinence, urinary frequency, nocturnal urination.  MS: Negative for joint pain, low back  pain.  Derm: Negative for rash or itching.  Neuro: Negative for weakness, abnormal sensation, seizure, frequent headaches, memory loss, confusion.  Psych: Positive for anxiety, depression. No suicidal ideation, hallucinations.  Endo: Negative for unusual weight change.  Heme: Negative for bruising or bleeding. Allergy: Negative for rash or hives.    Physical Examination:  BP 126/80  Pulse 72  Temp(Src) 97.8 F (36.6 C) (Temporal)  Ht 5\' 7"  (1.702 m)  Wt 188 lb (85.276 kg)  BMI 29.44 kg/m2   General: Well-nourished, well-developed in no acute distress.  Head: Normocephalic, atraumatic.   Eyes: Conjunctiva pink, no icterus. Mouth: Oropharyngeal mucosa moist and pink , no lesions erythema or exudate. Neck: Supple without thyromegaly, masses, or lymphadenopathy.  Lungs: Clear to auscultation bilaterally.  Heart: Regular rate and rhythm, no murmurs rubs or gallops.  Abdomen: Bowel sounds are normal, nontender, nondistended, no hepatosplenomegaly or masses, no abdominal bruits or    hernia , no rebound or guarding.   Rectal: Not performed. Extremities: No lower extremity edema. No clubbing or deformities.  Neuro: Alert and oriented x 4 , grossly normal neurologically.  Skin: Warm and dry, no rash or jaundice.   Psych: Alert and cooperative, normal mood and affect.  Labs: August third 2012, glucose 108, BUN 17, creatinine 1, calcium 9.8, albumin 4.2, alkaline phosphatase 66, AST 20, ALT 16, total bilirubin 0.5, sodium 141, potassium 4.3, TSH 1.14, white blood cell count 5800, hemoglobin 14.5, platelets 200,000.  Imaging Studies: Dg Chest Portable 1 View  01/22/2011  *RADIOLOGY REPORT*  Clinical Data: Chest pain.  Hiatal hernia.  PORTABLE CHEST - 1 VIEW  Comparison: None.  Findings: Minimal linear atelectasis is seen in the left lung base. Lungs otherwise clear.  Heart size normal.  Hiatal hernia noted.  IMPRESSION: No acute finding.  Hiatal hernia.  Original Report Authenticated By:  Bernadene Bell. Maricela Curet, M.D.

## 2011-01-30 ENCOUNTER — Encounter: Payer: Self-pay | Admitting: Gastroenterology

## 2011-01-30 NOTE — Assessment & Plan Note (Signed)
Suspected hiatal hernia on chest x-ray. Patient had paraesophageal hernia repair in 2010. Complains of atypical chest pressure. Typical heartburn well controlled on Nexium. Reevaluate with upper GI series.

## 2011-02-04 NOTE — Progress Notes (Signed)
Cc to PCP 

## 2011-02-05 ENCOUNTER — Ambulatory Visit: Payer: Medicaid Other | Admitting: Gastroenterology

## 2011-02-10 ENCOUNTER — Ambulatory Visit (HOSPITAL_COMMUNITY): Payer: Medicaid Other

## 2011-02-11 ENCOUNTER — Telehealth: Payer: Self-pay | Admitting: Gastroenterology

## 2011-02-11 NOTE — Telephone Encounter (Signed)
Pt called to let us know she did not have the UGI series done yesterday because she was sick and is still sick. She stated that she would call to reschedule once she felt better.

## 2011-02-18 ENCOUNTER — Ambulatory Visit (HOSPITAL_COMMUNITY)
Admission: RE | Admit: 2011-02-18 | Discharge: 2011-02-18 | Disposition: A | Discharge status: Home / Self Care | Payer: Medicaid Other | Source: Ambulatory Visit | Attending: Gastroenterology | Admitting: Gastroenterology

## 2011-02-18 DIAGNOSIS — R1013 Epigastric pain: Secondary | ICD-10-CM | POA: Insufficient documentation

## 2011-02-18 DIAGNOSIS — K449 Diaphragmatic hernia without obstruction or gangrene: Secondary | ICD-10-CM

## 2011-02-18 DIAGNOSIS — Z9889 Other specified postprocedural states: Secondary | ICD-10-CM

## 2011-02-20 ENCOUNTER — Telehealth: Payer: Self-pay

## 2011-02-20 NOTE — Telephone Encounter (Signed)
Please let her know I will be discussing next step with Dr. Jena Gauss. But she does have moderate sized hiatal hernia. No evidence of paraesophageal hernia like last time though. Further recommendations to follow.

## 2011-02-20 NOTE — Telephone Encounter (Signed)
Pt called requesting results. Wants to know what the next step will be. Her symptoms are not as bad as they were when she had to have the hernia repair last time. Please advise.

## 2011-02-21 NOTE — Telephone Encounter (Signed)
Pt aware.

## 2011-02-23 NOTE — Progress Notes (Signed)
Quick Note:  Please let patient know that she does have recurrent hernia, moderate sized. It is a simple hiatal hernia this time. Dr. Jena Gauss, recommends trying Nexium 40mg  BID for 6-8 weeks. If still having chest discomfort at that point, then she may need to go back to see Dr. Carolynn Sayers. ______

## 2011-02-26 NOTE — Progress Notes (Signed)
Results Cc to PCP  

## 2011-03-10 ENCOUNTER — Ambulatory Visit (HOSPITAL_COMMUNITY)
Admission: RE | Admit: 2011-03-10 | Discharge: 2011-03-10 | Disposition: A | Discharge status: Home / Self Care | Payer: Medicaid Other | Source: Ambulatory Visit | Attending: Internal Medicine | Admitting: Internal Medicine

## 2011-03-10 DIAGNOSIS — Z1231 Encounter for screening mammogram for malignant neoplasm of breast: Secondary | ICD-10-CM | POA: Insufficient documentation

## 2011-03-10 DIAGNOSIS — Z139 Encounter for screening, unspecified: Secondary | ICD-10-CM

## 2011-03-17 ENCOUNTER — Telehealth: Payer: Self-pay | Admitting: Gastroenterology

## 2011-03-17 NOTE — Telephone Encounter (Signed)
Pt called stating her acid reflux has gotten worse and wants to know if there is something we can give her for the discomfort- She is scheduled to see Dr Lorin Picket at [facility] on Friday.

## 2011-03-18 MED ORDER — SUCRALFATE 1 GM/10ML PO SUSP: 1.0000 g | Freq: Four times a day (QID) | ORAL | Status: DC

## 2011-03-18 NOTE — Telephone Encounter (Signed)
Pt aware.

## 2011-03-18 NOTE — Telephone Encounter (Signed)
Can try to add Carafate to see if helps. Agree with appt with Dr. Carolynn Sayers. If pain severe, go to ED.

## 2011-03-18 NOTE — Telephone Encounter (Signed)
Gastroenterology Phone Call Form  Primary Physician: rourk  1) Reason for phone call: epigastric pain   2) When did this start? Saturday    3) Have you been seen for this here before?  yes    4) If Pain, where is it located?  the epigastrium   5) Rate your pain/discomfort on scale 1-10? 7    6) Have you tried any medication for this?yes taking nexium bid   7) Do you have any trouble breathing, chest pain, vomiting blood, passing bloody stools, feel dehydrated, dizzy or have a high fever?no      :

## 2011-04-21 ENCOUNTER — Other Ambulatory Visit: Payer: Self-pay | Admitting: Adult Health

## 2011-04-21 ENCOUNTER — Other Ambulatory Visit (HOSPITAL_COMMUNITY)
Admission: RE | Admit: 2011-04-21 | Discharge: 2011-04-21 | Disposition: A | Discharge status: Home / Self Care | Payer: Medicaid Other | Source: Ambulatory Visit | Attending: Obstetrics and Gynecology | Admitting: Obstetrics and Gynecology

## 2011-04-21 DIAGNOSIS — Z01419 Encounter for gynecological examination (general) (routine) without abnormal findings: Secondary | ICD-10-CM | POA: Insufficient documentation

## 2011-05-01 ENCOUNTER — Other Ambulatory Visit: Payer: Self-pay | Admitting: Gastroenterology

## 2011-05-02 ENCOUNTER — Other Ambulatory Visit: Payer: Self-pay | Admitting: Gastroenterology

## 2011-08-05 ENCOUNTER — Other Ambulatory Visit: Payer: Self-pay

## 2011-08-05 DIAGNOSIS — I83893 Varicose veins of bilateral lower extremities with other complications: Secondary | ICD-10-CM

## 2011-09-26 ENCOUNTER — Encounter: Payer: Medicaid Other | Admitting: Vascular Surgery

## 2011-10-03 ENCOUNTER — Encounter: Payer: Medicaid Other | Admitting: Vascular Surgery

## 2011-11-17 ENCOUNTER — Encounter: Payer: Self-pay | Admitting: Internal Medicine

## 2011-11-20 ENCOUNTER — Ambulatory Visit: Payer: Medicaid Other | Admitting: Gastroenterology

## 2011-12-16 ENCOUNTER — Encounter: Payer: Medicaid Other | Admitting: Vascular Surgery

## 2011-12-22 ENCOUNTER — Encounter: Payer: Self-pay | Admitting: Vascular Surgery

## 2011-12-23 ENCOUNTER — Encounter: Payer: Self-pay | Admitting: Vascular Surgery

## 2011-12-23 ENCOUNTER — Ambulatory Visit (INDEPENDENT_AMBULATORY_CARE_PROVIDER_SITE_OTHER): Payer: Medicaid Other | Admitting: Vascular Surgery

## 2011-12-23 ENCOUNTER — Encounter (INDEPENDENT_AMBULATORY_CARE_PROVIDER_SITE_OTHER): Payer: Medicaid Other | Admitting: *Deleted

## 2011-12-23 VITALS — BP 135/74 | HR 72 | Resp 16 | Ht 67.0 in | Wt 165.0 lb

## 2011-12-23 DIAGNOSIS — M79609 Pain in unspecified limb: Secondary | ICD-10-CM

## 2011-12-23 DIAGNOSIS — I83893 Varicose veins of bilateral lower extremities with other complications: Secondary | ICD-10-CM

## 2011-12-23 NOTE — Progress Notes (Signed)
Subjective:     Patient ID: Rebecca Barrera, female   DOB: 1961-05-26, 51 y.o.   MRN: [id number]  HPI this 51 year old female was referred for evaluation of possible venous insufficiency. She has noted some spider veins in both legs. She has no history of DVT, thrombophlebitis, stasis ulcers, swelling, or severe pain. Occasionally when she will have some aching pain in both legs but it is not consistent. She does not wear elastic compression stockings. She has had no previous treatment of veins.  Past Medical History  Diagnosis Date  . Anxiety   . Depression   . Hiatal hernia   . Panic attacks   . Varicose veins   . Leg pain   . Anemia     History  Substance Use Topics  . Smoking status: Former Games developer  . Smokeless tobacco: Not on file  . Alcohol Use: No    Family History  Problem Relation Age of Onset  . Colon cancer Neg Hx   . Hyperlipidemia Mother   . Hypertension Mother   . Hyperlipidemia Son   . Hypertension Son     Allergies  Allergen Reactions  . Aspirin Other (See Comments)    Due to hernia  . Vicodin (Hydrocodone-Acetaminophen)     Pt states it gives her a headache    Current outpatient prescriptions:cholecalciferol (VITAMIN D) 1000 UNITS tablet, Take 1,000 Units by mouth daily.  , Disp: , Rfl: ;  clonazePAM (KLONOPIN) 1 MG tablet, Take 1 mg by mouth 2 (two) times daily.  , Disp: , Rfl: ;  DULoxetine (CYMBALTA) 30 MG capsule, Take 60 mg by mouth daily. , Disp: , Rfl: ;  esomeprazole (NEXIUM) 40 MG capsule, Take 40 mg by mouth daily before breakfast.  , Disp: , Rfl:  gemfibrozil (LOPID) 600 MG tablet, Take 600 mg by mouth 2 (two) times daily.  , Disp: , Rfl: ;  medroxyPROGESTERone (PROVERA) 10 MG tablet, Take 10 mg by mouth daily. For 10 days at the beginning of each Month, Disp: , Rfl: ;  promethazine (PHENERGAN) 25 MG tablet, Take 25 mg by mouth every 6 (six) hours as needed. For nausea, Disp: , Rfl:  sucralfate (CARAFATE) 1 GM/10ML suspension, TAKE 2 TEASPOONFULS  ( ) BY MOUTH FOUR TIMES DAILY., Disp: 420 mL, Rfl: 0;  traMADol (ULTRAM) 50 MG tablet, Take 50 mg by mouth every 6 (six) hours as needed. For pain, Disp: , Rfl: ;  traZODone (DESYREL) 100 MG tablet, Take 100 mg by mouth at bedtime.  , Disp: , Rfl: ;  Probiotic Product (HEALTHY COLON PO), Take 1 tablet by mouth daily.  , Disp: , Rfl:  SUMAtriptan (IMITREX) 100 MG tablet, Take 100 mg by mouth as needed.  , Disp: , Rfl: ;  valACYclovir (VALTREX) 1000 MG tablet, Take 1,000 mg by mouth daily. If needed for flare(s), Disp: , Rfl:   BP 135/74  Pulse 72  Resp 16  Ht 5\' 7"  (1.702 m)  Wt 165 lb (74.844 kg)  BMI 25.84 kg/m2  Body mass index is 25.84 kg/(m^2).           Review of Systems denies chest pain, dyspnea on exertion, PND, orthopnea, lateralizing weakness, hemoptysis, claudication    Objective:   Physical Exam blood pressure 135/74 heart rate 72 respirations 16 Gen.-alert and oriented x3 in no apparent distress HEENT normal for age Lungs no rhonchi or wheezing Cardiovascular regular rhythm no murmurs carotid pulses 3+ palpable no bruits audible Abdomen soft nontender no palpable masses Musculoskeletal  free of  major deformities Skin clear -no rashes Neurologic normal Lower extremities 3+ femoral and dorsalis pedis pulses palpable bilaterally with no edema there are a few scattered spider veins in both lower extremities 1 in the anterior aspect of the lower leg and also a few in the lateral aspect of the left thigh as well as in both ankle areas. There is no evidence of ulceration or bulging varicosities. Both feet are well-perfused.  Today I ordered bilateral venous duplex exams which are reviewed and interpreted. There is no DVT. There is only minimal reflux in the right mid great saphenous vein and the vein is quite small. Left main has no reflux.      Assessment:     No evidence of significant venous insufficiency-occasional spider veins    Plan:     No treatment  indicated-patient reassured

## 2012-02-16 ENCOUNTER — Other Ambulatory Visit (HOSPITAL_COMMUNITY): Payer: Self-pay | Admitting: Internal Medicine

## 2012-02-16 DIAGNOSIS — Z139 Encounter for screening, unspecified: Secondary | ICD-10-CM

## 2012-03-11 ENCOUNTER — Ambulatory Visit (HOSPITAL_COMMUNITY): Payer: Medicaid Other

## 2012-03-16 ENCOUNTER — Ambulatory Visit (HOSPITAL_COMMUNITY)
Admission: RE | Admit: 2012-03-16 | Discharge: 2012-03-16 | Disposition: A | Discharge status: Home / Self Care | Payer: Medicaid Other | Source: Ambulatory Visit | Attending: Internal Medicine | Admitting: Internal Medicine

## 2012-03-16 DIAGNOSIS — Z1231 Encounter for screening mammogram for malignant neoplasm of breast: Secondary | ICD-10-CM | POA: Insufficient documentation

## 2012-03-16 DIAGNOSIS — Z139 Encounter for screening, unspecified: Secondary | ICD-10-CM

## 2012-05-18 ENCOUNTER — Other Ambulatory Visit (HOSPITAL_COMMUNITY)
Admission: RE | Admit: 2012-05-18 | Discharge: 2012-05-18 | Disposition: A | Discharge status: Home / Self Care | Payer: Medicaid Other | Source: Ambulatory Visit | Attending: Obstetrics and Gynecology | Admitting: Obstetrics and Gynecology

## 2012-05-18 ENCOUNTER — Other Ambulatory Visit: Payer: Self-pay | Admitting: Adult Health

## 2012-05-18 DIAGNOSIS — Z1151 Encounter for screening for human papillomavirus (HPV): Secondary | ICD-10-CM | POA: Insufficient documentation

## 2012-05-18 DIAGNOSIS — Z01419 Encounter for gynecological examination (general) (routine) without abnormal findings: Secondary | ICD-10-CM | POA: Insufficient documentation

## 2012-08-12 ENCOUNTER — Telehealth: Payer: Self-pay | Admitting: *Deleted

## 2012-08-12 NOTE — Telephone Encounter (Signed)
Rebecca Barrera called today. She is experiencing a lot of pain from her hiatal hernia. She made an appt for April 1, however she wants to know if we can do anything for her sooner. Please call her back. Thank you.

## 2012-08-13 NOTE — Telephone Encounter (Signed)
Tried to call pt- NA 

## 2012-08-14 ENCOUNTER — Other Ambulatory Visit: Payer: Self-pay | Admitting: Gastroenterology

## 2012-08-15 ENCOUNTER — Encounter (HOSPITAL_COMMUNITY): Payer: Self-pay | Admitting: Emergency Medicine

## 2012-08-15 ENCOUNTER — Emergency Department (HOSPITAL_COMMUNITY)
Admission: EM | Admit: 2012-08-15 | Discharge: 2012-08-15 | Disposition: A | Discharge status: Home / Self Care | Payer: Medicaid Other | Attending: Emergency Medicine | Admitting: Emergency Medicine

## 2012-08-15 ENCOUNTER — Emergency Department (HOSPITAL_COMMUNITY): Payer: Medicaid Other

## 2012-08-15 DIAGNOSIS — Z79899 Other long term (current) drug therapy: Secondary | ICD-10-CM | POA: Insufficient documentation

## 2012-08-15 DIAGNOSIS — F329 Major depressive disorder, single episode, unspecified: Secondary | ICD-10-CM | POA: Insufficient documentation

## 2012-08-15 DIAGNOSIS — R109 Unspecified abdominal pain: Secondary | ICD-10-CM

## 2012-08-15 DIAGNOSIS — F3289 Other specified depressive episodes: Secondary | ICD-10-CM | POA: Insufficient documentation

## 2012-08-15 DIAGNOSIS — Z9851 Tubal ligation status: Secondary | ICD-10-CM | POA: Insufficient documentation

## 2012-08-15 DIAGNOSIS — Z8739 Personal history of other diseases of the musculoskeletal system and connective tissue: Secondary | ICD-10-CM | POA: Insufficient documentation

## 2012-08-15 DIAGNOSIS — Z87891 Personal history of nicotine dependence: Secondary | ICD-10-CM | POA: Insufficient documentation

## 2012-08-15 DIAGNOSIS — F411 Generalized anxiety disorder: Secondary | ICD-10-CM | POA: Insufficient documentation

## 2012-08-15 DIAGNOSIS — F410 Panic disorder [episodic paroxysmal anxiety] without agoraphobia: Secondary | ICD-10-CM | POA: Insufficient documentation

## 2012-08-15 DIAGNOSIS — R110 Nausea: Secondary | ICD-10-CM | POA: Insufficient documentation

## 2012-08-15 DIAGNOSIS — R1013 Epigastric pain: Secondary | ICD-10-CM | POA: Insufficient documentation

## 2012-08-15 DIAGNOSIS — Z8719 Personal history of other diseases of the digestive system: Secondary | ICD-10-CM | POA: Insufficient documentation

## 2012-08-15 DIAGNOSIS — Z8679 Personal history of other diseases of the circulatory system: Secondary | ICD-10-CM | POA: Insufficient documentation

## 2012-08-15 DIAGNOSIS — Z862 Personal history of diseases of the blood and blood-forming organs and certain disorders involving the immune mechanism: Secondary | ICD-10-CM | POA: Insufficient documentation

## 2012-08-15 LAB — CBC WITH DIFFERENTIAL/PLATELET
Basophils Relative: 0 % (ref 0–1)
Eosinophils Absolute: 0 10*3/uL (ref 0.0–0.7)
Lymphs Abs: 1.1 10*3/uL (ref 0.7–4.0)
MCH: 31.2 pg (ref 26.0–34.0)
MCHC: 34.3 g/dL (ref 30.0–36.0)
Neutrophils Relative %: 79 % — ABNORMAL HIGH (ref 43–77)
Platelets: 203 10*3/uL (ref 150–400)
RBC: 4.52 MIL/uL (ref 3.87–5.11)

## 2012-08-15 LAB — TROPONIN I: Troponin I: <0.3 ng/mL (ref <0.30)

## 2012-08-15 LAB — COMPREHENSIVE METABOLIC PANEL
ALT: 11 U/L (ref 0–35)
Albumin: 3.9 g/dL (ref 3.5–5.2)
Alkaline Phosphatase: 84 U/L (ref 39–117)
Potassium: 4.6 mEq/L (ref 3.5–5.1)
Sodium: 140 mEq/L (ref 135–145)
Total Protein: 7.1 g/dL (ref 6.0–8.3)

## 2012-08-15 LAB — LACTIC ACID, PLASMA: Lactic Acid, Venous: 0.7 mmol/L (ref 0.5–2.2)

## 2012-08-15 MED ORDER — HYDROMORPHONE HCL PF 1 MG/ML IJ SOLN
1.0000 mg | Freq: Once | INTRAMUSCULAR | Status: AC
  Administered 2012-08-15: 1 mg via INTRAVENOUS
  Filled 2012-08-15: qty 1

## 2012-08-15 MED ORDER — ONDANSETRON HCL 4 MG/2ML IJ SOLN
4.0000 mg | Freq: Once | INTRAMUSCULAR | Status: AC
  Administered 2012-08-15: 4 mg via INTRAVENOUS
  Filled 2012-08-15: qty 2

## 2012-08-15 MED ORDER — HYDROCODONE-ACETAMINOPHEN 5-325 MG PO TABS: 1.0000 | ORAL_TABLET | Freq: Four times a day (QID) | ORAL | Status: DC | PRN

## 2012-08-15 MED ORDER — HYDROMORPHONE HCL PF 1 MG/ML IJ SOLN
1.0000 mg | Freq: Once | INTRAMUSCULAR | Status: AC
Start: 2012-08-15 — End: 2012-08-15
  Administered 2012-08-15: 1 mg via INTRAVENOUS
  Filled 2012-08-15: qty 1

## 2012-08-15 MED ORDER — SODIUM CHLORIDE 0.9 % IV BOLUS (SEPSIS)
1000.0000 mL | Freq: Once | INTRAVENOUS | Status: AC
  Administered 2012-08-15: 1000 mL via INTRAVENOUS

## 2012-08-15 MED ORDER — PROMETHAZINE HCL 25 MG/ML IJ SOLN
25.0000 mg | Freq: Once | INTRAMUSCULAR | Status: AC
  Administered 2012-08-15: 25 mg via INTRAVENOUS
  Filled 2012-08-15 (×2): qty 1

## 2012-08-15 MED ORDER — PROMETHAZINE HCL 25 MG/ML IJ SOLN
25.0000 mg | Freq: Once | INTRAMUSCULAR | Status: AC
  Administered 2012-08-15: 25 mg via INTRAVENOUS
  Filled 2012-08-15: qty 1

## 2012-08-15 MED ORDER — PROMETHAZINE HCL 25 MG PO TABS: 25.0000 mg | ORAL_TABLET | Freq: Four times a day (QID) | ORAL | Status: DC | PRN

## 2012-08-15 NOTE — ED Notes (Signed)
Pt stats she feels better. Pain and nausea still present but much better. More energy observed. Pt still looks slight pale but much better.

## 2012-08-15 NOTE — ED Notes (Signed)
51f NG inserted. Verified by auscultation and clear fluid with sediment pulled out also.  Hooked to intermittant suction.

## 2012-08-15 NOTE — ED Notes (Signed)
Pt resting a lot better at this time. More relaxed. No facial grimacing at this time

## 2012-08-15 NOTE — ED Provider Notes (Signed)
History    This chart was scribed for Rebecca Munch, MD by Charolett Bumpers, ED Scribe. The patient was seen in room APA04/APA04. Patient's care was started at 1305.   CSN: [id number]  Arrival date & time 08/15/12  1241   First MD Initiated Contact with Patient 08/15/12 1305      Chief Complaint  Patient presents with  . Abdominal Pain    The history is provided by the patient. No language interpreter was used.   Rebecca Barrera is a 52 y.o. female who presents to the Emergency Department via EMS complaining of constant epigastric abdominal pain with associated nausea that started 2 days ago and has gradually worsened. She denies any vomiting but reports belching. She last had a BM this morning. She denies any fevers, syncope, difficulty breathing. She has a h/o a hiatal hernia and has an appointment to f/u in April. She denies any h/o HTN, DM, MI or CVA. She reports a h/o hiatal hernia repair 2-3 years ago at [facility]. CBG in en route was 133, she was given 4 mg IV.   Past Medical History  Diagnosis Date  . Anxiety   . Depression   . Hiatal hernia   . Panic attacks   . Varicose veins   . Leg pain   . Anemia     Past Surgical History  Procedure Laterality Date  . Hiatal hernia repair  06/2008    paraesophageal hernia repair  . Tubal ligation    . Esophagogastroduodenoscopy  2008    Dr. Rehman--> superficial ulceration at the GE junction a large hiatal hernia with dependent segment on the left side with food debris and coffee-ground material, swollen and erythematous folds, few antral erosions.  . Colonoscopy  2008    Dr. Karilyn Cota  . Esophagogastroduodenoscopy  02/08/2008    Normal esophageal mucosa aside from Schatzki's ring not manipulated (the patient not dysphagic) Large diaphragmatic and likely paraesophageal hernia, gastric      mucosa appeared normal, otherwise pylorus patent, normal D1 and D2.  Marland Kitchen Hernia repair      Family History  Problem Relation Age of Onset  .  Colon cancer Neg Hx   . Hyperlipidemia Mother   . Hypertension Mother   . Hyperlipidemia Son   . Hypertension Son     History  Substance Use Topics  . Smoking status: Former Games developer  . Smokeless tobacco: Not on file  . Alcohol Use: No    OB History   Grav Para Term Preterm Abortions TAB SAB Ect Mult Living                  Review of Systems  Constitutional:       Per HPI, otherwise negative  HENT:       Per HPI, otherwise negative  Respiratory:       Per HPI, otherwise negative  Cardiovascular:       Per HPI, otherwise negative  Gastrointestinal: Positive for nausea and abdominal pain. Negative for vomiting.  Endocrine:       Negative aside from HPI  Genitourinary:       Neg aside from HPI   Musculoskeletal:       Per HPI, otherwise negative  Skin: Negative.   Neurological: Negative for syncope and headaches.    Allergies  Aspirin and Vicodin  Home Medications   Current Outpatient Rx  Name  Route  Sig  Dispense  Refill  . cholecalciferol (VITAMIN D) 1000 UNITS tablet  Oral   Take 1,000 Units by mouth daily.           . clonazePAM (KLONOPIN) 1 MG tablet   Oral   Take 1 mg by mouth 2 (two) times daily.           . DULoxetine (CYMBALTA) 60 MG capsule   Oral   Take 60 mg by mouth daily.         Marland Kitchen eletriptan (RELPAX) 40 MG tablet   Oral   One tablet by mouth at onset of headache. May repeat in 2 hours if headache persists or recurs. may repeat in 2 hours if necessary         . esomeprazole (NEXIUM) 40 MG capsule   Oral   Take 40 mg by mouth 2 (two) times daily.          Marland Kitchen gemfibrozil (LOPID) 600 MG tablet   Oral   Take 600 mg by mouth 2 (two) times daily.           . promethazine (PHENERGAN) 25 MG tablet   Oral   Take 25 mg by mouth every 6 (six) hours as needed. For nausea         . sucralfate (CARAFATE) 1 GM/10ML suspension      TAKE 2 TEASPOONFULS ( ) BY MOUTH FOUR TIMES DAILY.   420 mL   0   . traMADol (ULTRAM) 50 MG  tablet   Oral   Take 50 mg by mouth every 6 (six) hours as needed. For pain         . traZODone (DESYREL) 50 MG tablet   Oral   Take 50 mg by mouth at bedtime as needed for sleep.         . medroxyPROGESTERone (PROVERA) 10 MG tablet   Oral   Take 10 mg by mouth daily. For 10 days at the beginning of each Month           BP 83/63  Pulse 95  Temp(Src) 98.7 F (37.1 C) (Oral)  Resp 20  SpO2 98%  Physical Exam  Nursing note and vitals reviewed. Constitutional: She is oriented to person, place, and time. She appears well-developed and well-nourished. No distress.  HENT:  Head: Normocephalic and atraumatic.  Eyes: Conjunctivae and EOM are normal.  Cardiovascular: Normal rate, regular rhythm and normal heart sounds.   Pulmonary/Chest: Effort normal and breath sounds normal. No stridor. No respiratory distress.  Abdominal: Soft. Bowel sounds are normal. She exhibits no distension. There is tenderness. There is guarding. There is no rebound.  Tenderness to palpation in epigastrium with some guarding.   Musculoskeletal: She exhibits no edema.  Neurological: She is alert and oriented to person, place, and time. No cranial nerve deficit.  Skin: Skin is warm and dry.  Psychiatric: She has a normal mood and affect.    ED Course  OG placement Date/Time: 08/15/2012 3:45 PM Performed by: Rebecca Barrera Authorized by: Rebecca Barrera Consent: Verbal consent obtained. Risks and benefits: risks, benefits and alternatives were discussed Consent given by: patient Patient understanding: patient states understanding of the procedure being performed Patient consent: the patient's understanding of the procedure matches consent given Procedure consent: procedure consent matches procedure scheduled Relevant documents: relevant documents present and verified Test results: test results available and properly labeled Site marked: the operative site was marked Imaging studies: imaging  studies available Required items: required blood products, implants, devices, and special equipment available Patient identity confirmed: verbally with patient Time out: Immediately  prior to procedure a "time out" was called to verify the correct patient, procedure, equipment, support staff and site/side marked as required. Preparation: Patient was prepped and draped in the usual sterile fashion. Local anesthesia used: narcotics. Patient sedated: no Patient tolerance: Patient tolerated the procedure well with no immediate complications.   (including critical care time)  DIAGNOSTIC STUDIES: Oxygen Saturation is 98% on room air, normal by my interpretation.    COORDINATION OF CARE:  13:18-Discussed planned course of treatment with the patient including IV fluids, pain and nausea medication, x-ray of abdomen and blood work, who is agreeable at this time.   13:30-Medications Orders: Sodium chloride 0.9% bolus 1,000 mL-once; Hydromorphone (Dilaudid) injection 1 mg-once; Ondansetron (Zofran) injection 4 mg-once.   Results for orders placed during the hospital encounter of 08/15/12  CBC WITH DIFFERENTIAL      Result Value Range   WBC 6.5  4.0 - 10.5 K/uL   RBC 4.52  3.87 - 5.11 MIL/uL   Hemoglobin 14.1  12.0 - 15.0 g/dL   HCT 14.7  82.9 - 56.2 %   MCV 90.9  78.0 - 100.0 fL   MCH 31.2  26.0 - 34.0 pg   MCHC 34.3  30.0 - 36.0 g/dL   RDW 13.0  86.5 - 78.4 %   Platelets 203  150 - 400 K/uL   Neutrophils Relative 79 (*) 43 - 77 %   Neutro Abs 5.2  1.7 - 7.7 K/uL   Lymphocytes Relative 17  12 - 46 %   Lymphs Abs 1.1  0.7 - 4.0 K/uL   Monocytes Relative 3  3 - 12 %   Monocytes Absolute 0.2  0.1 - 1.0 K/uL   Eosinophils Relative 0  0 - 5 %   Eosinophils Absolute 0.0  0.0 - 0.7 K/uL   Basophils Relative 0  0 - 1 %   Basophils Absolute 0.0  0.0 - 0.1 K/uL  COMPREHENSIVE METABOLIC PANEL      Result Value Range   Sodium 140  135 - 145 mEq/L   Potassium 4.6  3.5 - 5.1 mEq/L   Chloride 105   96 - 112 mEq/L   CO2 28  19 - 32 mEq/L   Glucose, Bld 126 (*) 70 - 99 mg/dL   BUN 14  6 - 23 mg/dL   Creatinine, Ser 6.96  0.50 - 1.10 mg/dL   Calcium 9.4  8.4 - 29.5 mg/dL   Total Protein 7.1  6.0 - 8.3 g/dL   Albumin 3.9  3.5 - 5.2 g/dL   AST 17  0 - 37 U/L   ALT 11  0 - 35 U/L   Alkaline Phosphatase 84  39 - 117 U/L   Total Bilirubin 0.2 (*) 0.3 - 1.2 mg/dL   GFR calc non Af Amer >90  >90 mL/min   GFR calc Af Amer >90  >90 mL/min  TROPONIN I      Result Value Range   Troponin I <0.30  <0.30 ng/mL  LACTIC ACID, PLASMA      Result Value Range   Lactic Acid, Venous 0.7  0.5 - 2.2 mmol/L  LIPASE, BLOOD      Result Value Range   Lipase 16  11 - 59 U/L    Dg Abd Acute W/chest  08/15/2012  *RADIOLOGY REPORT*  Clinical Data: Lower chest and upper abdominal pain.  History of hiatal hernia.  ACUTE ABDOMEN SERIES (ABDOMEN 2 VIEW & CHEST 1 VIEW)  Comparison: 01/22/2011 chest radiograph.  02/18/2011 upper GI study.  Findings: The cardiomediastinal silhouette is unremarkable. The lungs are clear. There is no evidence of airspace disease, pleural effusion or pneumothorax.  A large hiatal hernia is noted with gas and fluid. The bowel gas pattern within the abdomen pelvis are unremarkable. No dilated loops of small bowel are present. There is no evidence of pneumoperitoneum. No suspicious calcifications are identified. The bony structures are unremarkable.  IMPRESSION: Large hiatal hernia containing gas and fluid.  No evidence of small bowel obstruction or pneumoperitoneum.  No evidence of acute cardiopulmonary disease.   Original Report Authenticated By: Harmon Pier, M.D.      No diagnosis found.  Records retrieved from [facility].  Her surgeon was Dr. Lorin Picket - last procedure was in 2011.  Last GI study was 10/22/11: 1. Mixed sliding and paraesophageal type hiatal hernia. The anatomic EG junction is above the diaphragm on this study. Approximately 25% of the stomach also projects above the  diaphragmatic hiatus.  2. There was evidence of reflux of contrast material from the herniated stomach into the distal esophagus during the examination.  3. The caliber of the proximal gastric body is decreased as it passes through the anatomic diaphragmatic hiatus.  4. A marshmallow bolus exhibited stagnation at the EG junction before passing into the herniated portion of the stomach with additional sepsis of barium contrast material.  5. Normal appearance of the distal stomach, duodenum and proximal jejunum.   3:20 PM Pain transiently better w meds.  XR demonstrates air / fluid level  NG ordered   4:55 PM Following placement of the NG tube the patient had improvement in her condition.  She continued to require some analgesics.  No additional vomiting.  I discussed the patient's case with her surgeon at [facility].  The patient will call tomorrow for followup within the next 3 days.  MDM   I personally performed the services described in this documentation, which was scribed in my presence. The recorded information has been reviewed and is accurate.   This patient with known hiatal hernia now presents with increasing epigastric pain, belching.  On exam the patient is afebrile, with stable vital signs, and labs are reassuring.  Given the description of increasing pain there was initial suspicion of obstruction.  X-ray demonstrated progression of hiatal hernia without evidence of obstruction him and given the patient's description of recent bowel movement, there is low clinical suspicion.  This presentation is most consistent with progression of disease.  Absent distress, acute findings, I discussed the patient's case with her surgeon, who will followup with the patient expeditiously.    Rebecca Munch, MD 08/15/12 [phone]

## 2012-08-15 NOTE — ED Notes (Signed)
Pt stated she vomited in her towel she brought.

## 2012-08-15 NOTE — ED Notes (Addendum)
Pt c/o chronic hiatal hernia. C/o n/v. States pain to epigastric area. cbg 133. zofran iv 4mg  given en route. Pt alert/oriented. Pain 6 at this time. 8 when she vomits. Slight grimacing noted.

## 2012-08-16 ENCOUNTER — Other Ambulatory Visit: Payer: Self-pay

## 2012-08-16 ENCOUNTER — Telehealth: Payer: Self-pay | Admitting: Internal Medicine

## 2012-08-16 NOTE — Telephone Encounter (Signed)
Ginger spoke with pt. She wants a refill on her carafate. Refill request has been sent to refill box.

## 2012-08-16 NOTE — Telephone Encounter (Signed)
Ginger spoke with pt. 

## 2012-08-16 NOTE — Telephone Encounter (Signed)
Called patient to offer OV for today and she can not come due to transportation

## 2012-08-16 NOTE — Telephone Encounter (Signed)
Pt LMOM from Friday afternoon that she needed to speak with a nurse regarding her hiatal hernia. She has OV for 4/1 but wants to be seen sooner if possible. At the moment we have no open spots to move her unless we can open up an Urgent spot. Pt said she had been waiting for a return call from the nurse last week and hasn't heard from anyone. I believe you had tried reaching her but got no answer. Please advise [phone]

## 2012-08-17 MED ORDER — SUCRALFATE 1 GM/10ML PO SUSP: 1.0000 g | Freq: Three times a day (TID) | ORAL | Status: DC

## 2012-08-19 ENCOUNTER — Encounter (HOSPITAL_COMMUNITY): Payer: Self-pay | Admitting: *Deleted

## 2012-08-19 ENCOUNTER — Emergency Department (HOSPITAL_COMMUNITY)
Admission: EM | Admit: 2012-08-19 | Discharge: 2012-08-19 | Payer: Medicaid Other | Attending: Emergency Medicine | Admitting: Emergency Medicine

## 2012-08-19 ENCOUNTER — Emergency Department (HOSPITAL_COMMUNITY): Payer: Medicaid Other

## 2012-08-19 DIAGNOSIS — Z87891 Personal history of nicotine dependence: Secondary | ICD-10-CM | POA: Insufficient documentation

## 2012-08-19 DIAGNOSIS — Z9851 Tubal ligation status: Secondary | ICD-10-CM | POA: Insufficient documentation

## 2012-08-19 DIAGNOSIS — F411 Generalized anxiety disorder: Secondary | ICD-10-CM | POA: Insufficient documentation

## 2012-08-19 DIAGNOSIS — F3289 Other specified depressive episodes: Secondary | ICD-10-CM | POA: Insufficient documentation

## 2012-08-19 DIAGNOSIS — K449 Diaphragmatic hernia without obstruction or gangrene: Secondary | ICD-10-CM | POA: Insufficient documentation

## 2012-08-19 DIAGNOSIS — R109 Unspecified abdominal pain: Secondary | ICD-10-CM

## 2012-08-19 DIAGNOSIS — Z79899 Other long term (current) drug therapy: Secondary | ICD-10-CM | POA: Insufficient documentation

## 2012-08-19 DIAGNOSIS — F329 Major depressive disorder, single episode, unspecified: Secondary | ICD-10-CM | POA: Insufficient documentation

## 2012-08-19 DIAGNOSIS — Z862 Personal history of diseases of the blood and blood-forming organs and certain disorders involving the immune mechanism: Secondary | ICD-10-CM | POA: Insufficient documentation

## 2012-08-19 DIAGNOSIS — Z9889 Other specified postprocedural states: Secondary | ICD-10-CM | POA: Insufficient documentation

## 2012-08-19 DIAGNOSIS — R112 Nausea with vomiting, unspecified: Secondary | ICD-10-CM | POA: Insufficient documentation

## 2012-08-19 DIAGNOSIS — F410 Panic disorder [episodic paroxysmal anxiety] without agoraphobia: Secondary | ICD-10-CM | POA: Insufficient documentation

## 2012-08-19 DIAGNOSIS — Z8679 Personal history of other diseases of the circulatory system: Secondary | ICD-10-CM | POA: Insufficient documentation

## 2012-08-19 LAB — CBC WITH DIFFERENTIAL/PLATELET
Eosinophils Absolute: 0 10*3/uL (ref 0.0–0.7)
Hemoglobin: 14.9 g/dL (ref 12.0–15.0)
Lymphocytes Relative: 16 % (ref 12–46)
Lymphs Abs: 0.8 10*3/uL (ref 0.7–4.0)
MCH: 31 pg (ref 26.0–34.0)
MCV: 89.4 fL (ref 78.0–100.0)
Monocytes Relative: 2 % — ABNORMAL LOW (ref 3–12)
Neutrophils Relative %: 82 % — ABNORMAL HIGH (ref 43–77)
Platelets: 228 10*3/uL (ref 150–400)
RBC: 4.81 MIL/uL (ref 3.87–5.11)
WBC: 5.3 10*3/uL (ref 4.0–10.5)

## 2012-08-19 LAB — COMPREHENSIVE METABOLIC PANEL
ALT: 12 U/L (ref 0–35)
Alkaline Phosphatase: 81 U/L (ref 39–117)
BUN: 13 mg/dL (ref 6–23)
CO2: 29 mEq/L (ref 19–32)
Chloride: 105 mEq/L (ref 96–112)
GFR calc Af Amer: >90 mL/min (ref >90)
GFR calc non Af Amer: >90 mL/min (ref >90)
Glucose, Bld: 112 mg/dL — ABNORMAL HIGH (ref 70–99)
Potassium: 3.5 mEq/L (ref 3.5–5.1)
Sodium: 145 mEq/L (ref 135–145)
Total Bilirubin: 0.3 mg/dL (ref 0.3–1.2)

## 2012-08-19 LAB — URINALYSIS, ROUTINE W REFLEX MICROSCOPIC
Bilirubin Urine: NEGATIVE
Hgb urine dipstick: NEGATIVE
Ketones, ur: >80 mg/dL — AB
Nitrite: NEGATIVE
Protein, ur: NEGATIVE mg/dL
Specific Gravity, Urine: 1.02 (ref 1.005–1.030)
Urobilinogen, UA: 0.2 mg/dL (ref 0.0–1.0)

## 2012-08-19 LAB — LIPASE, BLOOD: Lipase: 15 U/L (ref 11–59)

## 2012-08-19 MED ORDER — HYDROMORPHONE HCL PF 1 MG/ML IJ SOLN
1.0000 mg | Freq: Once | INTRAMUSCULAR | Status: AC
  Administered 2012-08-19: 1 mg via INTRAVENOUS
  Filled 2012-08-19: qty 1

## 2012-08-19 MED ORDER — PROMETHAZINE HCL 25 MG/ML IJ SOLN
25.0000 mg | Freq: Once | INTRAMUSCULAR | Status: AC
  Administered 2012-08-19: 12.5 mg via INTRAVENOUS

## 2012-08-19 MED ORDER — PROMETHAZINE HCL 25 MG/ML IJ SOLN
INTRAMUSCULAR | Status: AC
  Filled 2012-08-19: qty 1

## 2012-08-19 MED ORDER — PANTOPRAZOLE SODIUM 40 MG IV SOLR
40.0000 mg | Freq: Once | INTRAVENOUS | Status: AC
  Administered 2012-08-19: 40 mg via INTRAVENOUS
  Filled 2012-08-19: qty 40

## 2012-08-19 MED ORDER — ONDANSETRON HCL 4 MG/2ML IJ SOLN
4.0000 mg | Freq: Once | INTRAMUSCULAR | Status: AC
  Administered 2012-08-19: 4 mg via INTRAVENOUS
  Filled 2012-08-19: qty 2

## 2012-08-19 MED ORDER — SODIUM CHLORIDE 0.9 % IV SOLN
INTRAVENOUS | Status: DC
  Administered 2012-08-19: 14:00:00 via INTRAVENOUS

## 2012-08-19 NOTE — ED Provider Notes (Signed)
History     This chart was scribed for Osvaldo Human, MD, MD by Smitty Pluck, ED Scribe. The patient was seen in room APA09/APA09 and the patient's care was started at 1:20 PM.   CSN: [id number]  Arrival date & time 08/19/12  1217      Chief Complaint  Patient presents with  . Abdominal Pain     The history is provided by the patient and medical records. No language interpreter was used.   Rebecca Barrera is a 52 y.o. female who presents to the Emergency Department complaining of constant, moderate abdominal pain onset today. Pt reports having nausea and vomiting. She was seen in ED 4 days ago for same but she states symptoms improved until today. She was scheduled to have appointment at [facility] with Dr. Lorin Picket, her surgeon, but the appointment was no. She reports having a hiatal hernia. Pt denies fever, chills, diarrhea, weakness, cough, SOB and any other pain.    Past Medical History  Diagnosis Date  . Anxiety   . Depression   . Hiatal hernia   . Panic attacks   . Varicose veins   . Leg pain   . Anemia     Past Surgical History  Procedure Laterality Date  . Hiatal hernia repair  06/2008    paraesophageal hernia repair  . Tubal ligation    . Esophagogastroduodenoscopy  2008    Dr. Rehman--> superficial ulceration at the GE junction a large hiatal hernia with dependent segment on the left side with food debris and coffee-ground material, swollen and erythematous folds, few antral erosions.  . Colonoscopy  2008    Dr. Karilyn Cota  . Esophagogastroduodenoscopy  02/08/2008    Normal esophageal mucosa aside from Schatzki's ring not manipulated (the patient not dysphagic) Large diaphragmatic and likely paraesophageal hernia, gastric      mucosa appeared normal, otherwise pylorus patent, normal D1 and D2.  Marland Kitchen Hernia repair      Family History  Problem Relation Age of Onset  . Colon cancer Neg Hx   . Hyperlipidemia Mother   . Hypertension Mother   . Hyperlipidemia Son   .  Hypertension Son     History  Substance Use Topics  . Smoking status: Former Games developer  . Smokeless tobacco: Not on file  . Alcohol Use: No    OB History   Grav Para Term Preterm Abortions TAB SAB Ect Mult Living                  Review of Systems  Constitutional: Negative for fever and chills.  Gastrointestinal: Positive for nausea, vomiting and abdominal pain.  All other systems reviewed and are negative.      Allergies  Aspirin and Vicodin  Home Medications   Current Outpatient Rx  Name  Route  Sig  Dispense  Refill  . cholecalciferol (VITAMIN D) 1000 UNITS tablet   Oral   Take 1,000 Units by mouth daily.           . clonazePAM (KLONOPIN) 1 MG tablet   Oral   Take 1 mg by mouth 2 (two) times daily.           . DULoxetine (CYMBALTA) 60 MG capsule   Oral   Take 60 mg by mouth daily.         Marland Kitchen esomeprazole (NEXIUM) 40 MG capsule   Oral   Take 40 mg by mouth 2 (two) times daily.          Marland Kitchen  gemfibrozil (LOPID) 600 MG tablet   Oral   Take 600 mg by mouth 2 (two) times daily.           Marland Kitchen HYDROcodone-acetaminophen (NORCO/VICODIN) 5-325 MG per tablet   Oral   Take 1 tablet by mouth every 6 (six) hours as needed for pain.   15 tablet   0   . medroxyPROGESTERone (PROVERA) 10 MG tablet   Oral   Take 10 mg by mouth daily. For 10 days at the beginning of each Month         . promethazine (PHENERGAN) 25 MG tablet   Oral   Take 1 tablet (25 mg total) by mouth every 6 (six) hours as needed for nausea.   30 tablet   0   . sucralfate (CARAFATE) 1 GM/10ML suspension   Oral   Take 10 mLs (1 g total) by mouth 4 (four) times daily -  with meals and at bedtime.   420 mL   0   . traZODone (DESYREL) 50 MG tablet   Oral   Take 50 mg by mouth at bedtime as needed for sleep.         Marland Kitchen eletriptan (RELPAX) 40 MG tablet   Oral   One tablet by mouth at onset of headache. May repeat in 2 hours if headache persists or recurs. may repeat in 2 hours if  necessary           BP 141/92  Pulse 86  Temp(Src) 98.4 F (36.9 C) (Oral)  Resp 18  Ht 5\' 7"  (1.702 m)  Wt 160 lb (72.576 kg)  BMI 25.05 kg/m2  SpO2 99%  Physical Exam  Nursing note and vitals reviewed. Constitutional: She is oriented to person, place, and time. She appears well-developed and well-nourished. No distress.  HENT:  Head: Normocephalic and atraumatic.  Eyes: Conjunctivae are normal. Pupils are equal, round, and reactive to light.  Neck: Normal range of motion. Neck supple.  Cardiovascular: Normal rate, regular rhythm and normal heart sounds.   Pulmonary/Chest: Effort normal and breath sounds normal. No respiratory distress. She has no wheezes. She has no rales.  Abdominal: Soft. Bowel sounds are normal. She exhibits no distension. There is tenderness. There is no rebound and no guarding.  Lymphadenopathy:    She has no cervical adenopathy.  Neurological: She is alert and oriented to person, place, and time. No cranial nerve deficit.  Skin: Skin is warm and dry.  Psychiatric: She has a normal mood and affect. Her behavior is normal.    ED Course  Procedures (including critical care time) DIAGNOSTIC STUDIES: Oxygen Saturation is 99% on room air, normal by my interpretation.    COORDINATION OF CARE: 1:22 PM Discussed ED treatment with pt and pt agrees.   2:13 PM Results for orders placed during the hospital encounter of 08/19/12  CBC WITH DIFFERENTIAL      Result Value Range   WBC 5.3  4.0 - 10.5 K/uL   RBC 4.81  3.87 - 5.11 MIL/uL   Hemoglobin 14.9  12.0 - 15.0 g/dL   HCT 16.1  09.6 - 04.5 %   MCV 89.4  78.0 - 100.0 fL   MCH 31.0  26.0 - 34.0 pg   MCHC 34.7  30.0 - 36.0 g/dL   RDW 40.9  81.1 - 91.4 %   Platelets 228  150 - 400 K/uL   Neutrophils Relative 82 (*) 43 - 77 %   Neutro Abs 4.3  1.7 - 7.7 K/uL  Lymphocytes Relative 16  12 - 46 %   Lymphs Abs 0.8  0.7 - 4.0 K/uL   Monocytes Relative 2 (*) 3 - 12 %   Monocytes Absolute 0.1  0.1 - 1.0  K/uL   Eosinophils Relative 0  0 - 5 %   Eosinophils Absolute 0.0  0.0 - 0.7 K/uL   Basophils Relative 0  0 - 1 %   Basophils Absolute 0.0  0.0 - 0.1 K/uL  COMPREHENSIVE METABOLIC PANEL      Result Value Range   Sodium 145  135 - 145 mEq/L   Potassium 3.5  3.5 - 5.1 mEq/L   Chloride 105  96 - 112 mEq/L   CO2 29  19 - 32 mEq/L   Glucose, Bld 112 (*) 70 - 99 mg/dL   BUN 13  6 - 23 mg/dL   Creatinine, Ser 4.54  0.50 - 1.10 mg/dL   Calcium 9.5  8.4 - 09.8 mg/dL   Total Protein 7.4  6.0 - 8.3 g/dL   Albumin 4.2  3.5 - 5.2 g/dL   AST 18  0 - 37 U/L   ALT 12  0 - 35 U/L   Alkaline Phosphatase 81  39 - 117 U/L   Total Bilirubin 0.3  0.3 - 1.2 mg/dL   GFR calc non Af Amer >90  >90 mL/min   GFR calc Af Amer >90  >90 mL/min  LIPASE, BLOOD      Result Value Range   Lipase 15  11 - 59 U/L   Dg Abd Acute W/chest  08/15/2012  *RADIOLOGY REPORT*  Clinical Data: Lower chest and upper abdominal pain.  History of hiatal hernia.  ACUTE ABDOMEN SERIES (ABDOMEN 2 VIEW & CHEST 1 VIEW)  Comparison: 01/22/2011 chest radiograph.  02/18/2011 upper GI study.  Findings: The cardiomediastinal silhouette is unremarkable. The lungs are clear. There is no evidence of airspace disease, pleural effusion or pneumothorax.  A large hiatal hernia is noted with gas and fluid. The bowel gas pattern within the abdomen pelvis are unremarkable. No dilated loops of small bowel are present. There is no evidence of pneumoperitoneum. No suspicious calcifications are identified. The bony structures are unremarkable.  IMPRESSION: Large hiatal hernia containing gas and fluid.  No evidence of small bowel obstruction or pneumoperitoneum.  No evidence of acute cardiopulmonary disease.   Original Report Authenticated By: Harmon Pier, M.D.    2:41 PM Lab workup shows a large hiatal hernia with airfluid level, about like 4 days ago.  Case discussed with Brendia Sacks, M.D., [facility], who requests that I call her surgeon at [facility].  3:53 PM I spoke to Dr. Leanne Chang, general surgeon, who accepts pt for admission to the ED at [facility].    1. Abdominal pain   2. Nausea and vomiting   3. Hiatal hernia     I personally performed the services described in this documentation, which was scribed in my presence. The recorded information has been reviewed and is accurate.  Osvaldo Human, MD      Carleene Cooper III, MD 08/19/12 [phone]

## 2012-08-19 NOTE — ED Notes (Signed)
abd pain, N/V seen here 3/16 for same.

## 2012-08-19 NOTE — H&P (Deleted)
ERROR

## 2012-08-31 ENCOUNTER — Ambulatory Visit: Payer: Medicaid Other | Admitting: Gastroenterology

## 2012-11-25 ENCOUNTER — Other Ambulatory Visit (HOSPITAL_COMMUNITY): Payer: Self-pay | Admitting: Obstetrics & Gynecology

## 2013-01-17 ENCOUNTER — Telehealth: Payer: Self-pay | Admitting: Adult Health

## 2013-01-17 NOTE — Telephone Encounter (Signed)
Encouraged  Her to see PCP but she could try melatonin OTC or benadryl, or make appt. To be seen.

## 2013-01-17 NOTE — Telephone Encounter (Signed)
Spoke with pt. Having trouble sleeping. Noticed it when she turned 52 in June. Pt wonders if you can call something in for sleep. Unsure if it's related to hormones. Uses [employer]. JSY

## 2013-03-07 ENCOUNTER — Other Ambulatory Visit (HOSPITAL_COMMUNITY): Payer: Self-pay | Admitting: Internal Medicine

## 2013-03-07 DIAGNOSIS — Z139 Encounter for screening, unspecified: Secondary | ICD-10-CM

## 2013-03-17 ENCOUNTER — Ambulatory Visit (HOSPITAL_COMMUNITY)
Admission: RE | Admit: 2013-03-17 | Discharge: 2013-03-17 | Disposition: A | Discharge status: Home / Self Care | Payer: Medicaid Other | Source: Ambulatory Visit | Attending: Internal Medicine | Admitting: Internal Medicine

## 2013-03-17 DIAGNOSIS — Z1231 Encounter for screening mammogram for malignant neoplasm of breast: Secondary | ICD-10-CM | POA: Insufficient documentation

## 2013-03-17 DIAGNOSIS — Z139 Encounter for screening, unspecified: Secondary | ICD-10-CM

## 2013-05-23 ENCOUNTER — Other Ambulatory Visit: Payer: Self-pay | Admitting: Adult Health

## 2013-05-23 ENCOUNTER — Telehealth: Payer: Self-pay | Admitting: Adult Health

## 2013-05-23 NOTE — Telephone Encounter (Signed)
Pt states taking provera (1st tens days of q month) for abnormal vaginal bleeding. Pt states symptoms of tiredness, exhaustion, and headaches last 3-4 months has worsen when taking the provera. Pt states has an appt 06/17/12. Per Cyril Mourning, NP, pt to continue taking provera as prescribed and will discuss at pt appt on 06/17/12. Pt verbalized understanding.

## 2013-06-17 ENCOUNTER — Other Ambulatory Visit: Payer: Self-pay | Admitting: Adult Health

## 2013-07-14 ENCOUNTER — Other Ambulatory Visit: Payer: Self-pay | Admitting: Adult Health

## 2013-08-03 ENCOUNTER — Other Ambulatory Visit: Payer: Self-pay | Admitting: Adult Health

## 2013-09-22 ENCOUNTER — Other Ambulatory Visit: Payer: Self-pay | Admitting: Adult Health

## 2013-10-18 ENCOUNTER — Other Ambulatory Visit: Payer: Self-pay | Admitting: Adult Health

## 2013-10-26 ENCOUNTER — Other Ambulatory Visit: Payer: Self-pay | Admitting: Adult Health

## 2013-11-24 ENCOUNTER — Ambulatory Visit (INDEPENDENT_AMBULATORY_CARE_PROVIDER_SITE_OTHER): Payer: Medicaid Other | Admitting: Adult Health

## 2013-11-24 ENCOUNTER — Encounter: Payer: Self-pay | Admitting: Adult Health

## 2013-11-24 ENCOUNTER — Other Ambulatory Visit: Payer: Medicaid Other

## 2013-11-24 VITALS — BP 132/80 | HR 78 | Ht 64.0 in | Wt 190.0 lb

## 2013-11-24 DIAGNOSIS — G43909 Migraine, unspecified, not intractable, without status migrainosus: Secondary | ICD-10-CM | POA: Insufficient documentation

## 2013-11-24 DIAGNOSIS — G43109 Migraine with aura, not intractable, without status migrainosus: Secondary | ICD-10-CM

## 2013-11-24 DIAGNOSIS — Z0000 Encounter for general adult medical examination without abnormal findings: Secondary | ICD-10-CM

## 2013-11-24 DIAGNOSIS — Z1212 Encounter for screening for malignant neoplasm of rectum: Secondary | ICD-10-CM

## 2013-11-24 DIAGNOSIS — Z01419 Encounter for gynecological examination (general) (routine) without abnormal findings: Secondary | ICD-10-CM

## 2013-11-24 DIAGNOSIS — Z780 Asymptomatic menopausal state: Secondary | ICD-10-CM

## 2013-11-24 LAB — LIPID PANEL
CHOLESTEROL: 182 mg/dL (ref 0–200)
HDL: 58 mg/dL (ref >39)
LDL Cholesterol: 110 mg/dL — ABNORMAL HIGH (ref 0–99)
TRIGLYCERIDES: 72 mg/dL (ref <150)
Total CHOL/HDL Ratio: 3.1 Ratio
VLDL: 14 mg/dL (ref 0–40)

## 2013-11-24 LAB — COMPREHENSIVE METABOLIC PANEL
ALT: 11 U/L (ref 0–35)
AST: 16 U/L (ref 0–37)
Albumin: 4.4 g/dL (ref 3.5–5.2)
Alkaline Phosphatase: 82 U/L (ref 39–117)
BUN: 13 mg/dL (ref 6–23)
CHLORIDE: 106 meq/L (ref 96–112)
CO2: 29 mEq/L (ref 19–32)
CREATININE: 0.8 mg/dL (ref 0.50–1.10)
Calcium: 9.7 mg/dL (ref 8.4–10.5)
GLUCOSE: 93 mg/dL (ref 70–99)
Potassium: 4.4 mEq/L (ref 3.5–5.3)
Sodium: 142 mEq/L (ref 135–145)
Total Bilirubin: 0.3 mg/dL (ref 0.2–1.2)
Total Protein: 6.8 g/dL (ref 6.0–8.3)

## 2013-11-24 LAB — HEMOCCULT GUIAC POC 1CARD (OFFICE): Fecal Occult Blood, POC: NEGATIVE

## 2013-11-24 LAB — CBC
HEMATOCRIT: 39.8 % (ref 36.0–46.0)
HEMOGLOBIN: 13.3 g/dL (ref 12.0–15.0)
MCH: 30.2 pg (ref 26.0–34.0)
MCHC: 33.4 g/dL (ref 30.0–36.0)
MCV: 90.5 fL (ref 78.0–100.0)
Platelets: 213 10*3/uL (ref 150–400)
RBC: 4.4 MIL/uL (ref 3.87–5.11)
RDW: 13.6 % (ref 11.5–15.5)
WBC: 3.9 10*3/uL — AB (ref 4.0–10.5)

## 2013-11-24 MED ORDER — BUTALBITAL-APAP-CAFFEINE 50-300-40 MG PO CAPS: ORAL_CAPSULE | ORAL | Status: DC

## 2013-11-24 NOTE — Progress Notes (Signed)
Patient ID: Rebecca Barrera F Barrera, female   DOB: Aug 10, 1960, 53 y.o.   MRN: [id number] History of Present Illness: Rebecca Barrera is a 53 year old white female in for a physical.She had a normal pap with negative HPV 05/18/12.She is complaining of headaches very often.She has seen Dr Sherryll BurgerShah her PCP and he made appt with Dr Gerilyn Pilgrimoonquah in July.She has no vision loss or numbness with her headaches, but is light. .She also complains of constipation, but some of that may be med related.   Current Medications, Allergies, Past Medical History, Past Surgical History, Family History and Social History were reviewed in [facility] electronic medical record.     Review of Systems: Patient denies any blurred vision, shortness of breath, chest pain, abdominal pain, problems with  urination, or intercourse. No having sex at present.No joint pain.See HPI for positives.She has not had a period in over a year, will stop provera and get US and FSH.    Physical Exam:BP 132/80  Pulse 78  Ht 5\' 4"  (1.626 m)  Wt 190 lb (86.183 kg)  BMI 32.60 kg/m2 General:  Well developed, well nourished, no acute distress Skin:  Warm and dry Neck:  Midline trachea, normal thyroid Lungs; Clear to auscultation bilaterally Breast:  No dominant palpable mass, retraction, or nipple discharge Cardiovascular: Regular rate and rhythm Abdomen:  Soft, non tender, no hepatosplenomegaly Pelvic:  External genitalia is normal in appearance.  The vagina has decrease in color, moisture and rugae.The cervix is smooth.  Uterus is felt to be normal size, shape, and contour.  No                adnexal masses or tenderness noted. Rectal: Good sphincter tone, no polyps, or hemorrhoids felt.  Hemoccult negative. Extremities:  No swelling or varicosities noted Psych:  No mood changes, alert and cooperative, but worried about her headaches, but she has had for years but seem to be worse with age and she has had several CTs that were normal.   Impression: Yearly gyn  exam no pap Migraines Menopause     Plan: Rx Fioricet #30 1 every 6 hours prn headache no refills Return in 3 weeks for US and FSH Check CBC,CMP,TSH and lipids  Physical and pap in 1 year Mammogram yearly  Pt says colonoscopy not due yet Keep appt with Dr Gerilyn Pilgrimoonquah and review handout on migraines Try miralax  If headaches change got to ER

## 2013-11-24 NOTE — Patient Instructions (Signed)
Physical in 1 year with pap Mammogram yearly Keep apt with  Dr Gerilyn Pilgrimdoonquah [facility] STOP provera Migraine Headache A migraine headache is an intense, throbbing pain on one or both sides of your head. A migraine can last for 30 minutes to several hours. CAUSES  The exact cause of a migraine headache is not always known. However, a migraine may be caused when nerves in the brain become irritated and release chemicals that cause inflammation. This causes pain. Certain things may also trigger migraines, such as:  Alcohol.  Smoking.  Stress.  Menstruation.  Aged cheeses.  Foods or drinks that contain nitrates, glutamate, aspartame, or tyramine.  Lack of sleep.  Chocolate.  Caffeine.  Hunger.  Physical exertion.  Fatigue.  Medicines used to treat chest pain (nitroglycerine), birth control pills, estrogen, and some blood pressure medicines. SIGNS AND SYMPTOMS  Pain on one or both sides of your head.  Pulsating or throbbing pain.  Severe pain that prevents daily activities.  Pain that is aggravated by any physical activity.  Nausea, vomiting, or both.  Dizziness.  Pain with exposure to bright lights, loud noises, or activity.  General sensitivity to bright lights, loud noises, or smells. Before you get a migraine, you may get warning signs that a migraine is coming (aura). An aura may include:  Seeing flashing lights.  Seeing bright spots, halos, or zig-zag lines.  Having tunnel vision or blurred vision.  Having feelings of numbness or tingling.  Having trouble talking.  Having muscle weakness. DIAGNOSIS  A migraine headache is often diagnosed based on:  Symptoms.  Physical exam.  A CT scan or MRI of your head. These imaging tests cannot diagnose migraines, but they can help rule out other causes of headaches. TREATMENT Medicines may be given for pain and nausea. Medicines can also be given to help prevent recurrent migraines.    HOME CARE INSTRUCTIONS  Only take over-the-counter or prescription medicines for pain or discomfort as directed by your health care provider. The use of long-term narcotics is not recommended.  Lie down in a dark, quiet room when you have a migraine.  Keep a journal to find out what may trigger your migraine headaches. For example, write down:  What you eat and drink.  How much sleep you get.  Any change to your diet or medicines.  Limit alcohol consumption.  Quit smoking if you smoke.  Get 7-9 hours of sleep, or as recommended by your health care provider.  Limit stress.  Keep lights dim if bright lights bother you and make your migraines worse. SEEK IMMEDIATE MEDICAL CARE IF:   Your migraine becomes severe.  You have a fever.  You have a stiff neck.  You have vision loss.  You have muscular weakness or loss of muscle control.  You start losing your balance or have trouble walking.  You feel faint or pass out.  You have severe symptoms that are different from your first symptoms. MAKE SURE YOU:   Understand these instructions.  Will watch your condition.  Will get help right away if you are not doing well or get worse. Document Released: 05/19/2005 Document Revised: 03/09/2013 Document Reviewed: 01/24/2013 [facility] Patient Information 2015 [employer] [place]. This information is not intended to replace advice given to you by your health care provider. Make sure you discuss any questions you have with your health care provider.

## 2013-11-25 ENCOUNTER — Telehealth: Payer: Self-pay | Admitting: Adult Health

## 2013-11-25 LAB — TSH: TSH: 1.114 u[IU]/mL (ref 0.350–4.500)

## 2013-11-25 NOTE — Telephone Encounter (Signed)
Pt aware of labs, will mail her a copy and fax copy to Dr Sherryll BurgerShah

## 2013-11-29 ENCOUNTER — Telehealth: Payer: Self-pay | Admitting: *Deleted

## 2013-11-29 NOTE — Telephone Encounter (Signed)
Pt states that she wants to talk to JAG or Dr. Despina HiddenEure about getting medication for headache prevention. The pt stated that she can get in to the Neurologists but it is later in the month and she can't wait that long. I advised the pt that for Dr.Eure to give her that medication she may have to have an appointment with him first, pt stated that she was just seen by JAG.   I advised the pt that JAG would be back in the office tomorrow and she stated that she wanted to talk to JAG about this issue.

## 2013-11-30 NOTE — Telephone Encounter (Signed)
Says headaches better at first with Fioricet, but not now has appt with Dr Gerilyn Pilgrimoonquah 7/14 wants to see if she can have something else for headache before then ,will discuss with dr Despina HiddenEure in am and all her back,if in pain go to ER.

## 2013-12-01 NOTE — Telephone Encounter (Signed)
Spoke with Dr Despina HiddenEure let Dr Gerilyn Pilgrimoonquah handle headaches, has re spots on legs had question about shingles explained shingles are on dermatomes and only on one side , see PCP

## 2013-12-09 ENCOUNTER — Other Ambulatory Visit: Payer: Self-pay | Admitting: Adult Health

## 2013-12-26 ENCOUNTER — Other Ambulatory Visit: Payer: Medicaid Other

## 2013-12-26 ENCOUNTER — Ambulatory Visit: Payer: Medicaid Other | Admitting: Adult Health

## 2014-01-18 ENCOUNTER — Other Ambulatory Visit: Payer: Medicaid Other

## 2014-01-18 ENCOUNTER — Ambulatory Visit: Payer: Medicaid Other | Admitting: Adult Health

## 2014-02-20 ENCOUNTER — Other Ambulatory Visit: Payer: Self-pay | Admitting: Adult Health

## 2014-02-20 DIAGNOSIS — Z139 Encounter for screening, unspecified: Secondary | ICD-10-CM

## 2014-02-21 ENCOUNTER — Ambulatory Visit: Payer: Medicaid Other | Admitting: Adult Health

## 2014-02-21 ENCOUNTER — Other Ambulatory Visit: Payer: Medicaid Other

## 2014-03-03 ENCOUNTER — Other Ambulatory Visit (HOSPITAL_COMMUNITY): Payer: Self-pay | Admitting: Internal Medicine

## 2014-03-03 ENCOUNTER — Ambulatory Visit (HOSPITAL_COMMUNITY)
Admission: RE | Admit: 2014-03-03 | Discharge: 2014-03-03 | Disposition: A | Discharge status: Home / Self Care | Payer: Medicaid Other | Source: Ambulatory Visit | Attending: Internal Medicine | Admitting: Internal Medicine

## 2014-03-03 DIAGNOSIS — M25512 Pain in left shoulder: Secondary | ICD-10-CM

## 2014-03-24 ENCOUNTER — Ambulatory Visit (HOSPITAL_COMMUNITY): Payer: Medicaid Other

## 2014-03-28 ENCOUNTER — Ambulatory Visit: Payer: Medicaid Other | Admitting: Adult Health

## 2014-03-28 ENCOUNTER — Other Ambulatory Visit: Payer: Medicaid Other

## 2014-03-30 ENCOUNTER — Ambulatory Visit (HOSPITAL_COMMUNITY)
Admission: RE | Admit: 2014-03-30 | Discharge: 2014-03-30 | Disposition: A | Discharge status: Home / Self Care | Payer: Medicaid Other | Source: Ambulatory Visit | Attending: Adult Health | Admitting: Adult Health

## 2014-03-30 DIAGNOSIS — Z1231 Encounter for screening mammogram for malignant neoplasm of breast: Secondary | ICD-10-CM | POA: Diagnosis not present

## 2014-03-30 DIAGNOSIS — Z139 Encounter for screening, unspecified: Secondary | ICD-10-CM

## 2014-03-31 ENCOUNTER — Ambulatory Visit (HOSPITAL_COMMUNITY): Payer: Medicaid Other

## 2014-04-03 ENCOUNTER — Encounter: Payer: Self-pay | Admitting: Adult Health

## 2014-04-17 ENCOUNTER — Other Ambulatory Visit: Payer: Medicaid Other

## 2014-04-17 ENCOUNTER — Ambulatory Visit: Payer: Medicaid Other | Admitting: Adult Health

## 2014-05-04 ENCOUNTER — Telehealth: Payer: Self-pay | Admitting: Adult Health

## 2014-05-04 NOTE — Telephone Encounter (Signed)
Pt cancelled gyn US back in summer and needs to reschedule US and FSH, no bleeding

## 2014-05-19 ENCOUNTER — Telehealth: Payer: Self-pay | Admitting: Internal Medicine

## 2014-05-19 NOTE — Telephone Encounter (Signed)
Patient called today and sounded confused and slow to speak. She said she can't remember who did her last upper GI if it was [facility] or [facility]. I [facility] her [facility] and she doesn't [facility]. I asked her if she wanted to make an OV to be seen and she never would answer me. She just wants to find out if she's ever [facility]. I told her that I would have to dig a little farther into her chart, but at the moment I was working by myself and had patients waiting to check in. I do not see in epic where she's had an EGD and not really sure what she is wanting me to do. [phone]

## 2014-05-24 NOTE — Telephone Encounter (Signed)
Late entry, I had tried to call pt yesterday- NA

## 2014-05-29 NOTE — Telephone Encounter (Signed)
Tried to call pt- NA 

## 2014-05-31 NOTE — Telephone Encounter (Signed)
I have been unable to reach pt. [facility].

## 2014-06-13 ENCOUNTER — Other Ambulatory Visit: Payer: Medicaid Other

## 2014-06-13 ENCOUNTER — Ambulatory Visit: Payer: Medicaid Other | Admitting: Adult Health

## 2014-06-14 ENCOUNTER — Telehealth: Payer: Self-pay | Admitting: *Deleted

## 2014-06-14 NOTE — Telephone Encounter (Signed)
Has US appt 1/29, having some constipation,try mirlax and actevia yogurt

## 2014-06-23 ENCOUNTER — Other Ambulatory Visit: Payer: Medicaid Other

## 2014-06-30 ENCOUNTER — Ambulatory Visit (INDEPENDENT_AMBULATORY_CARE_PROVIDER_SITE_OTHER): Payer: Medicaid Other

## 2014-06-30 ENCOUNTER — Encounter: Payer: Self-pay | Admitting: Adult Health

## 2014-06-30 ENCOUNTER — Ambulatory Visit (INDEPENDENT_AMBULATORY_CARE_PROVIDER_SITE_OTHER): Payer: Medicaid Other | Admitting: Adult Health

## 2014-06-30 VITALS — BP 130/82 | Ht 67.0 in | Wt 182.5 lb

## 2014-06-30 DIAGNOSIS — Z780 Asymptomatic menopausal state: Secondary | ICD-10-CM

## 2014-06-30 NOTE — Progress Notes (Signed)
Subjective:     Patient ID: Rebecca Barrera, female   DOB: 1960-11-25, 54 y.o.   MRN: [id number]  HPI Rebecca Barrera is a 54 year old white female in for US to assess endometrium, had thickening in past.Has occasional hot flash.No bleeding.  Review of Systems See HPI Reviewed past medical,surgical, social and family history. Reviewed medications and allergies.     Objective:   Physical Exam BP 130/82 mmHg  Ht 5\' 7"  (1.702 m)  Wt 182 lb 8 oz (82.781 kg)  BMI 28.58 kg/m2Reviewed US with pt, will check FSH.   Uterus 5.5 x 4.3 x 3.2 cm, anteverted  Endometrium 2.4 mm, symmetrical,   Right ovary 1.7 x 1.3 x 1.1 cm,   Left ovary 1.7 x 0.9 x 0.8 cm,   No free fluid or adnexal masses noted within the pelvis  Technician Comments:  Anteverted uterus, Endometrium-2.514mm symmetrical, bilateral adnexa/ovaries appear WNL  Assessment:     Menopause    Plan:     [facility] for [facility] and physical.

## 2014-06-30 NOTE — Patient Instructions (Signed)
Pap and physical in December

## 2014-07-01 LAB — FOLLICLE STIMULATING HORMONE: FSH: 119.2 m[IU]/mL

## 2014-07-03 ENCOUNTER — Telehealth: Payer: Self-pay | Admitting: Adult Health

## 2014-07-03 NOTE — Telephone Encounter (Signed)
Pt aware FSH 119.2 can have hot flashes but if has any bleeding call

## 2014-07-06 ENCOUNTER — Telehealth: Payer: Self-pay | Admitting: Adult Health

## 2014-07-06 NOTE — Telephone Encounter (Signed)
Wants names of probiotic and OTC vitamin try centrum silver and activa

## 2014-07-17 ENCOUNTER — Other Ambulatory Visit (HOSPITAL_COMMUNITY): Payer: Self-pay | Admitting: General Surgery

## 2014-07-17 DIAGNOSIS — R1011 Right upper quadrant pain: Secondary | ICD-10-CM

## 2014-07-24 ENCOUNTER — Ambulatory Visit (HOSPITAL_COMMUNITY)
Admission: RE | Admit: 2014-07-24 | Discharge: 2014-07-24 | Disposition: A | Discharge status: Home / Self Care | Payer: Medicaid Other | Source: Ambulatory Visit | Attending: General Surgery | Admitting: General Surgery

## 2014-07-24 ENCOUNTER — Other Ambulatory Visit (HOSPITAL_COMMUNITY): Payer: Self-pay | Admitting: General Surgery

## 2014-07-24 ENCOUNTER — Other Ambulatory Visit (HOSPITAL_COMMUNITY): Payer: Self-pay | Admitting: Internal Medicine

## 2014-07-24 ENCOUNTER — Ambulatory Visit (HOSPITAL_COMMUNITY)
Admission: RE | Admit: 2014-07-24 | Discharge: 2014-07-24 | Disposition: A | Discharge status: Home / Self Care | Payer: Medicaid Other | Source: Ambulatory Visit | Attending: Internal Medicine | Admitting: Internal Medicine

## 2014-07-24 ENCOUNTER — Ambulatory Visit (HOSPITAL_COMMUNITY): Payer: Medicaid Other

## 2014-07-24 DIAGNOSIS — R079 Chest pain, unspecified: Secondary | ICD-10-CM | POA: Diagnosis present

## 2014-07-24 DIAGNOSIS — R1011 Right upper quadrant pain: Secondary | ICD-10-CM

## 2014-07-24 DIAGNOSIS — R52 Pain, unspecified: Secondary | ICD-10-CM

## 2015-03-05 ENCOUNTER — Other Ambulatory Visit (HOSPITAL_COMMUNITY): Payer: Self-pay | Admitting: Internal Medicine

## 2015-03-05 DIAGNOSIS — Z1231 Encounter for screening mammogram for malignant neoplasm of breast: Secondary | ICD-10-CM

## 2015-04-02 ENCOUNTER — Ambulatory Visit (HOSPITAL_COMMUNITY): Payer: Medicaid Other

## 2015-04-11 ENCOUNTER — Ambulatory Visit (HOSPITAL_COMMUNITY): Payer: Medicaid Other

## 2015-05-17 ENCOUNTER — Ambulatory Visit (HOSPITAL_COMMUNITY): Payer: Medicaid Other

## 2015-05-30 ENCOUNTER — Ambulatory Visit (HOSPITAL_COMMUNITY)
Admission: RE | Admit: 2015-05-30 | Discharge: 2015-05-30 | Disposition: A | Discharge status: Home / Self Care | Payer: Medicaid Other | Source: Ambulatory Visit | Attending: Internal Medicine | Admitting: Internal Medicine

## 2015-05-30 DIAGNOSIS — Z1231 Encounter for screening mammogram for malignant neoplasm of breast: Secondary | ICD-10-CM | POA: Diagnosis not present

## 2015-06-12 ENCOUNTER — Other Ambulatory Visit: Payer: Medicaid Other | Admitting: Adult Health

## 2015-07-11 ENCOUNTER — Other Ambulatory Visit: Payer: Medicaid Other | Admitting: Adult Health

## 2016-01-30 ENCOUNTER — Other Ambulatory Visit: Payer: Self-pay | Admitting: Obstetrics & Gynecology

## 2016-01-30 ENCOUNTER — Telehealth: Payer: Self-pay | Admitting: Obstetrics & Gynecology

## 2016-01-30 MED ORDER — SULFAMETHOXAZOLE-TRIMETHOPRIM 800-160 MG PO TABS: 1.0000 | ORAL_TABLET | Freq: Two times a day (BID) | ORAL | 0 refills | Status: DC

## 2016-01-30 NOTE — Telephone Encounter (Signed)
Pt c/o burning/stinging with urination, feels like she is not emptying bladder. Pt usually sees Victorino DikeJennifer, but Victorino DikeJennifer out of office until next Tuesday.   Pt informed would need to make an appt for evaluation has not been seen since 06/2014.  Pt states she is not feeling well at all and would like a Rx for UTI prescribed. Please advise

## 2016-01-30 NOTE — Telephone Encounter (Signed)
Pt called stating that she would like a medication called into her pharmacy for her UTI, Pt knows she has a UTI. Pt states that she uses [facility]. Please contact pt

## 2016-02-06 ENCOUNTER — Other Ambulatory Visit (HOSPITAL_COMMUNITY): Payer: Self-pay | Admitting: Internal Medicine

## 2016-02-06 ENCOUNTER — Ambulatory Visit (HOSPITAL_COMMUNITY)
Admission: RE | Admit: 2016-02-06 | Discharge: 2016-02-06 | Disposition: A | Discharge status: Home / Self Care | Payer: Medicaid Other | Source: Ambulatory Visit | Attending: Internal Medicine | Admitting: Internal Medicine

## 2016-02-06 DIAGNOSIS — M25572 Pain in left ankle and joints of left foot: Secondary | ICD-10-CM

## 2016-02-06 DIAGNOSIS — M79672 Pain in left foot: Secondary | ICD-10-CM

## 2016-02-06 DIAGNOSIS — M7732 Calcaneal spur, left foot: Secondary | ICD-10-CM | POA: Insufficient documentation

## 2016-07-09 ENCOUNTER — Other Ambulatory Visit (HOSPITAL_COMMUNITY): Payer: Self-pay | Admitting: Internal Medicine

## 2016-07-09 DIAGNOSIS — Z1231 Encounter for screening mammogram for malignant neoplasm of breast: Secondary | ICD-10-CM

## 2016-07-29 ENCOUNTER — Ambulatory Visit (INDEPENDENT_AMBULATORY_CARE_PROVIDER_SITE_OTHER): Payer: Medicaid Other | Admitting: Adult Health

## 2016-07-29 ENCOUNTER — Encounter: Payer: Self-pay | Admitting: Adult Health

## 2016-07-29 ENCOUNTER — Other Ambulatory Visit (HOSPITAL_COMMUNITY)
Admission: RE | Admit: 2016-07-29 | Discharge: 2016-07-29 | Disposition: A | Discharge status: Home / Self Care | Payer: Medicaid Other | Source: Ambulatory Visit | Attending: Adult Health | Admitting: Adult Health

## 2016-07-29 VITALS — BP 104/70 | HR 86 | Ht 63.25 in | Wt 161.0 lb

## 2016-07-29 DIAGNOSIS — Z0000 Encounter for general adult medical examination without abnormal findings: Secondary | ICD-10-CM

## 2016-07-29 DIAGNOSIS — Z1151 Encounter for screening for human papillomavirus (HPV): Secondary | ICD-10-CM | POA: Insufficient documentation

## 2016-07-29 DIAGNOSIS — Z780 Asymptomatic menopausal state: Secondary | ICD-10-CM | POA: Insufficient documentation

## 2016-07-29 DIAGNOSIS — Z01419 Encounter for gynecological examination (general) (routine) without abnormal findings: Secondary | ICD-10-CM | POA: Insufficient documentation

## 2016-07-29 DIAGNOSIS — Z1211 Encounter for screening for malignant neoplasm of colon: Secondary | ICD-10-CM | POA: Diagnosis not present

## 2016-07-29 DIAGNOSIS — Z1212 Encounter for screening for malignant neoplasm of rectum: Secondary | ICD-10-CM

## 2016-07-29 DIAGNOSIS — Z124 Encounter for screening for malignant neoplasm of cervix: Secondary | ICD-10-CM

## 2016-07-29 DIAGNOSIS — Z113 Encounter for screening for infections with a predominantly sexual mode of transmission: Secondary | ICD-10-CM | POA: Diagnosis present

## 2016-07-29 LAB — HEMOCCULT GUIAC POC 1CARD (OFFICE): Fecal Occult Blood, POC: NEGATIVE

## 2016-07-29 NOTE — Progress Notes (Signed)
Patient ID: Rebecca Barrera, female   DOB: 30-Mar-1961, 56 y.o.   MRN: [id number] History of Present Illness: [place] LionsLois is a 56 year old white female, divorced, G4P3, PM in for a well woman gyn exam and pap. [place].    Current Medications, Allergies, Past Medical History, Past Surgical History, Family History and Social History were reviewed in [facility] electronic medical record.     Review of Systems: Patient denies any headaches, hearing loss, fatigue, blurred vision, shortness of breath, chest pain, abdominal pain, problems with bowel movements, urination, or intercourse(not currently having sex). No joint pain or mood swings.    Physical Exam:BP 104/70 (BP Location: Left Arm, Patient Position: Sitting, Cuff Size: Normal)   Pulse 86   Ht 5' 3.25" (1.607 m)   Wt 161 lb (73 kg)   BMI 28.29 kg/m  General:  Well developed, well nourished, no acute distress Skin:  Warm and dry Neck:  Midline trachea, normal thyroid, good ROM, no lymphadenopathy Lungs; Clear to auscultation bilaterally Breast:  No dominant palpable mass, retraction, or nipple discharge Cardiovascular: Regular rate and rhythm Abdomen:  Soft, non tender, no hepatosplenomegaly Pelvic:  External genitalia is normal in appearance, no lesions.  The vagina is normal in appearance. Urethra has no lesions or masses. The cervix is smooth, pap with HPV and GC/CHL performed, friable with EC brush.  Uterus is felt to be normal size, shape, and contour.  No adnexal masses or tenderness noted.Bladder is non tender, no masses felt. Rectal: Good sphincter tone, no polyps, or hemorrhoids felt.  Hemoccult negative. Extremities/musculoskeletal:  No swelling or varicosities noted, no clubbing or cyanosis Psych:  No mood changes, alert and cooperative,seems happy PHQ 2 score 0.  Impression: 1. Encounter for routine gynecological examination with Papanicolaou smear of cervix   2. Routine cervical smear   3. Postmenopausal   4.  Screening for colorectal cancer       Plan: Physical in 1 year,pap in 3 if normal Labs with PCP  Mammogram yearly Colonoscopy per GI

## 2016-07-29 NOTE — Patient Instructions (Signed)
Physical in 1 year,pap in 3 if normal Labs with PCP  Mammogram yearly Colonoscopy per GI

## 2016-07-30 LAB — CYTOLOGY - PAP
CHLAMYDIA, DNA PROBE: NEGATIVE
Diagnosis: NEGATIVE
HPV: NOT DETECTED
Neisseria Gonorrhea: NEGATIVE

## 2016-08-06 ENCOUNTER — Ambulatory Visit (HOSPITAL_COMMUNITY): Payer: Medicaid Other

## 2016-08-15 ENCOUNTER — Ambulatory Visit (HOSPITAL_COMMUNITY): Payer: Medicaid Other

## 2016-08-15 ENCOUNTER — Telehealth: Payer: Self-pay | Admitting: Adult Health

## 2016-08-15 NOTE — Telephone Encounter (Signed)
Pt called stating that she would  Like a call back from [place], Pt did not want to state the reason why. Please contact pt

## 2016-08-15 NOTE — Telephone Encounter (Signed)
Patient called stating she is concerned she is lactose intolerant. She can eat or drink any dairy products and within 30 minutes she is in the bathroom. She has looked on google and is wanting something to "supplement her" . Please advise.

## 2016-08-18 NOTE — Telephone Encounter (Signed)
Spoke with patient who states she has tried lactose free milk but it makes her sick. She says she has tried over the counter medications but nothing is helping. She states "I'm at a standstill and don't know what to do". She wants something to "regulate" her. Please advise.

## 2016-08-19 NOTE — Telephone Encounter (Signed)
Left message that I called and that she should see GI.

## 2016-08-25 ENCOUNTER — Ambulatory Visit (HOSPITAL_COMMUNITY): Payer: Medicaid Other

## 2016-08-29 ENCOUNTER — Ambulatory Visit (HOSPITAL_COMMUNITY): Payer: Medicaid Other

## 2016-09-03 ENCOUNTER — Ambulatory Visit (HOSPITAL_COMMUNITY)
Admission: RE | Admit: 2016-09-03 | Discharge: 2016-09-03 | Disposition: A | Discharge status: Home / Self Care | Payer: Medicaid Other | Source: Ambulatory Visit | Attending: Internal Medicine | Admitting: Internal Medicine

## 2016-09-03 DIAGNOSIS — Z1231 Encounter for screening mammogram for malignant neoplasm of breast: Secondary | ICD-10-CM | POA: Diagnosis present

## 2017-08-03 ENCOUNTER — Other Ambulatory Visit: Payer: Self-pay | Admitting: Adult Health

## 2017-09-16 ENCOUNTER — Other Ambulatory Visit: Payer: Self-pay | Admitting: Adult Health

## 2017-10-13 ENCOUNTER — Other Ambulatory Visit: Payer: Self-pay | Admitting: Adult Health

## 2018-02-08 ENCOUNTER — Other Ambulatory Visit (HOSPITAL_COMMUNITY): Payer: Self-pay | Admitting: Internal Medicine

## 2018-02-08 DIAGNOSIS — Z1231 Encounter for screening mammogram for malignant neoplasm of breast: Secondary | ICD-10-CM

## 2018-02-18 ENCOUNTER — Ambulatory Visit (HOSPITAL_COMMUNITY)
Admission: RE | Admit: 2018-02-18 | Discharge: 2018-02-18 | Disposition: A | Discharge status: Home / Self Care | Payer: Medicaid Other | Source: Ambulatory Visit | Attending: Internal Medicine | Admitting: Internal Medicine

## 2018-02-18 DIAGNOSIS — Z1231 Encounter for screening mammogram for malignant neoplasm of breast: Secondary | ICD-10-CM | POA: Diagnosis not present

## 2018-07-06 NOTE — Progress Notes (Deleted)
Psychiatric Initial Adult Assessment   Patient Identification: Rebecca Barrera MRN:  [id number] Date of Evaluation:  07/06/2018 Referral Source: *** Chief Complaint:   Visit Diagnosis: No diagnosis found.  History of Present Illness:   Rebecca Barrera is a 58 y.o. year old female with a history of depression, anxiety, who is referred for    Associated Signs/Symptoms: Depression Symptoms:  {DEPRESSION SYMPTOMS:20000} (Hypo) Manic Symptoms:  {BHH MANIC SYMPTOMS:22872} Anxiety Symptoms:  {BHH ANXIETY SYMPTOMS:22873} Psychotic Symptoms:  {BHH PSYCHOTIC SYMPTOMS:22874} PTSD Symptoms: {BHH PTSD SYMPTOMS:22875}  Past Psychiatric History:  Outpatient:  Psychiatry admission:  Previous suicide attempt:  Past trials of medication:  History of violence:   Previous Psychotropic Medications: {YES/NO:21197}  Substance Abuse History in the last 12 months:  {yes no:314532}  Consequences of Substance Abuse: {BHH CONSEQUENCES OF SUBSTANCE ABUSE:22880}  Past Medical History:  Past Medical History:  Diagnosis Date  . Anemia   . Anxiety   . Depression   . Hiatal hernia   . High cholesterol   . Leg pain   . Migraines   . Panic attacks   . Varicose veins     Past Surgical History:  Procedure Laterality Date  . COLONOSCOPY  2008   Dr. Karilyn Cotaehman-->  . ESOPHAGOGASTRODUODENOSCOPY  2008   Dr. Rehman--> superficial ulceration at the GE junction a large hiatal hernia with dependent segment on the left side with food debris and coffee-ground material, swollen and erythematous folds, few antral erosions.  . ESOPHAGOGASTRODUODENOSCOPY  02/08/2008   Normal esophageal mucosa aside from Schatzki's ring not manipulated (the patient not dysphagic) Large diaphragmatic and likely paraesophageal hernia, gastric      mucosa appeared normal, otherwise pylorus patent, normal D1 and D2.  . HERNIA REPAIR    . HIATAL HERNIA REPAIR  06/2008   paraesophageal hernia repair  . TUBAL LIGATION      Family Psychiatric  History: ***  Family History:  Family History  Problem Relation Age of Onset  . Hyperlipidemia Mother   . Hypertension Mother   . Stroke Mother   . Dementia Mother   . Migraines Mother   . Hyperlipidemia Son   . Hypertension Son   . Heart disease Father   . Heart disease Sister   . Epilepsy Brother   . Heart disease Brother   . Colon cancer Neg Hx     Social History:   Social History   Socioeconomic History  . Marital status: Divorced    Spouse name: Not on file  . Number of children: 3  . Years of education: Not on file  . Highest education level: Not on file  Occupational History  . Occupation: Disabled  Social Needs  . Financial resource strain: Not on file  . Food insecurity:    Worry: Not on file    Inability: Not on file  . Transportation needs:    Medical: Not on file    Non-medical: Not on file  Tobacco Use  . Smoking status: Former Smoker    Years: 5.00    Types: Cigarettes  . Smokeless tobacco: Never Used  Substance and Sexual Activity  . Alcohol use: No  . Drug use: No  . Sexual activity: Not Currently    Birth control/protection: Surgical    Comment: tubal  Lifestyle  . Physical activity:    Days per week: Not on file    Minutes per session: Not on file  . Stress: Not on file  Relationships  . Social connections:  Talks on phone: Not on file    Gets together: Not on file    Attends religious service: Not on file    Active member of club or organization: Not on file    Attends meetings of clubs or organizations: Not on file    Relationship status: Not on file  Other Topics Concern  . Not on file  Social History Narrative  . Not on file    Additional Social History: ***  Allergies:   Allergies  Allergen Reactions  . Aspirin Other (See Comments)    Due to hernia  . Hydrocodone-Acetaminophen Itching  . Lorazepam Other (See Comments)    migranes  . Vicodin [Hydrocodone-Acetaminophen]     Pt states it gives her a headache     Metabolic Disorder Labs: No results found for: HGBA1C, MPG No results found for: PROLACTIN Lab Results  Component Value Date   CHOL 182 11/24/2013   TRIG 72 11/24/2013   HDL 58 11/24/2013   CHOLHDL 3.1 11/24/2013   VLDL 14 11/24/2013   LDLCALC 110 (H) 11/24/2013   Lab Results  Component Value Date   TSH 1.114 11/24/2013    Therapeutic Level Labs: No results found for: LITHIUM No results found for: CBMZ No results found for: VALPROATE  Current Medications: Current Outpatient Medications  Medication Sig Dispense Refill  . busPIRone (BUSPAR) 10 MG tablet Take 5 mg by mouth 2 (two) times daily.    . citalopram (CELEXA) 10 MG tablet Take 5 mg by mouth daily.     . clonazePAM (KLONOPIN) 0.5 MG tablet Take 0.5 mg by mouth 3 (three) times daily as needed for anxiety.    . DULoxetine (CYMBALTA) 60 MG capsule Take 60 mg by mouth daily.    . Multiple Vitamins-Minerals (CENTRUM SILVER 50+WOMEN PO) Take by mouth daily.    . pravastatin (PRAVACHOL) 20 MG tablet Take 20 mg by mouth daily.    . promethazine (PHENERGAN) 25 MG tablet Take 1 tablet (25 mg total) by mouth every 6 (six) hours as needed for nausea. 30 tablet 0  . SUMAtriptan-naproxen (TREXIMET) 85-500 MG tablet Take 1 tablet by mouth every 2 (two) hours as needed.     . zolpidem (AMBIEN) 5 MG tablet Take 5 mg by mouth at bedtime as needed for sleep.     No current facility-administered medications for this visit.     Musculoskeletal: Strength & Muscle Tone: within normal limits Gait & Station: normal Patient leans: N/A  Psychiatric Specialty Exam: ROS  There were no vitals taken for this visit.There is no height or weight on file to calculate BMI.  General Appearance: Fairly Groomed  Eye Contact:  Good  Speech:  Clear and Coherent  Volume:  Normal  Mood:  {BHH MOOD:22306}  Affect:  {Affect (PAA):22687}  Thought Process:  Coherent  Orientation:  Full (Time, Place, and Person)  Thought Content:  Logical  Suicidal  Thoughts:  {ST/HT (PAA):22692}  Homicidal Thoughts:  {ST/HT (PAA):22692}  Memory:  Immediate;   Good  Judgement:  {Judgement (PAA):22694}  Insight:  {Insight (PAA):22695}  Psychomotor Activity:  Normal  Concentration:  Concentration: Good and Attention Span: Good  Recall:  Good  Fund of Knowledge:Good  Language: Good  Akathisia:  No  Handed:  Right  AIMS (if indicated):  not done  Assets:  Communication Skills Desire for Improvement  ADL's:  Intact  Cognition: WNL  Sleep:  {BHH GOOD/FAIR/POOR:22877}   Screenings: PHQ2-9     [facility]  PHQ-2 Total Score  0      Assessment and Plan:  Assessment  Plan  The patient demonstrates the following risk factors for suicide: Chronic risk factors for suicide include: {Chronic Risk Factors for Suicide:30414011}. Acute risk factors for suicide include: {Acute Risk Factors for GLOVFIE:33295188}. Protective factors for this patient include: {Protective Factors for Suicide CZYS:06301601}. Considering these factors, the overall suicide risk at this point appears to be {Desc; low/moderate/high:110033}. Patient {ACTION; IS/IS UXN:23557322} appropriate for outpatient follow up.    [name], MD 2/4/202010:36 AM

## 2018-07-09 ENCOUNTER — Ambulatory Visit (HOSPITAL_COMMUNITY): Payer: Self-pay | Admitting: Psychiatry

## 2018-08-08 DIAGNOSIS — F4321 Adjustment disorder with depressed mood: Secondary | ICD-10-CM | POA: Insufficient documentation
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
## 2018-08-18 ENCOUNTER — Ambulatory Visit (HOSPITAL_COMMUNITY): Payer: Self-pay | Admitting: Psychiatry

## 2018-08-23 ENCOUNTER — Encounter (HOSPITAL_COMMUNITY): Payer: Self-pay | Admitting: Psychiatry

## 2018-08-23 ENCOUNTER — Ambulatory Visit (INDEPENDENT_AMBULATORY_CARE_PROVIDER_SITE_OTHER): Payer: Medicaid Other | Admitting: Psychiatry

## 2018-08-23 ENCOUNTER — Other Ambulatory Visit: Payer: Self-pay

## 2018-08-23 ENCOUNTER — Ambulatory Visit (HOSPITAL_COMMUNITY): Payer: Self-pay | Admitting: Psychiatry

## 2018-08-23 VITALS — BP 130/84 | HR 65 | Ht 63.0 in | Wt 174.8 lb

## 2018-08-23 DIAGNOSIS — G4700 Insomnia, unspecified: Secondary | ICD-10-CM

## 2018-08-23 DIAGNOSIS — F419 Anxiety disorder, unspecified: Secondary | ICD-10-CM

## 2018-08-23 DIAGNOSIS — Z79899 Other long term (current) drug therapy: Secondary | ICD-10-CM | POA: Diagnosis not present

## 2018-08-23 DIAGNOSIS — F331 Major depressive disorder, recurrent, moderate: Secondary | ICD-10-CM | POA: Diagnosis not present

## 2018-08-23 MED ORDER — BUPROPION HCL ER (XL) 150 MG PO TB24: 150.0000 mg | ORAL_TABLET | Freq: Every day | ORAL | 1 refills | Status: DC

## 2018-08-23 MED ORDER — CLONAZEPAM 1 MG PO TABS: 1.0000 mg | ORAL_TABLET | Freq: Two times a day (BID) | ORAL | 1 refills | Status: DC

## 2018-08-23 NOTE — Patient Instructions (Signed)
1. Continue duloxeitne 60 mg daily  2. Start burpropion 150 mg daily 3. Increase clonazepam 1 mg twice a day as needed for anxiety 4. Return to clinic in two months for 30 mins

## 2018-10-04 ENCOUNTER — Telehealth (HOSPITAL_COMMUNITY): Payer: Self-pay | Admitting: *Deleted

## 2018-10-04 NOTE — Telephone Encounter (Signed)
5/5 10:30 am Video appointment scheduled
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
## 2018-10-04 NOTE — Telephone Encounter (Signed)
Please advise her to have follow up a little sooner so that we can discuss this. I would be available 10:30 or 2PM for 30 mins tomorrow (5/5), fyi.

## 2018-10-04 NOTE — Telephone Encounter (Signed)
Dr Vanetta Shawl Patient called & left 3 VM stating that her mom has been placed on comfort care due to kidney failure & she's having a lot of stress & it's a lot going on . And said she's been on Wellbutrin XL  150 mg for 6 weeks now & it's helping she can tell BUT @ this time with her Mom " getting ready to pass" can she have the Wellbutrin increased? # [phone]

## 2018-10-05 ENCOUNTER — Other Ambulatory Visit: Payer: Self-pay

## 2018-10-05 ENCOUNTER — Encounter (HOSPITAL_COMMUNITY): Payer: Self-pay | Admitting: Psychiatry

## 2018-10-05 ENCOUNTER — Ambulatory Visit (INDEPENDENT_AMBULATORY_CARE_PROVIDER_SITE_OTHER): Payer: Medicaid Other | Admitting: Psychiatry

## 2018-10-05 DIAGNOSIS — F331 Major depressive disorder, recurrent, moderate: Secondary | ICD-10-CM

## 2018-10-05 MED ORDER — BUPROPION HCL ER (XL) 300 MG PO TB24: 300.0000 mg | ORAL_TABLET | Freq: Every day | ORAL | 1 refills | Status: DC

## 2018-10-05 MED ORDER — CLONAZEPAM 1 MG PO TABS: 1.0000 mg | ORAL_TABLET | Freq: Two times a day (BID) | ORAL | 0 refills | Status: DC

## 2018-10-05 NOTE — Patient Instructions (Addendum)
1. Continue duloxetine 60 mg daily  2. Increase bupropion 300 mg daily 3. Continue clonazepam 1 mg twice a day as needed for anxiety  4. Next appointment: 6/11 at 11 AM

## 2018-10-28 ENCOUNTER — Ambulatory Visit (HOSPITAL_COMMUNITY): Payer: Medicaid Other | Admitting: Psychiatry
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
## 2018-11-11 ENCOUNTER — Ambulatory Visit (INDEPENDENT_AMBULATORY_CARE_PROVIDER_SITE_OTHER): Payer: Medicaid Other | Admitting: Psychiatry

## 2018-11-11 ENCOUNTER — Other Ambulatory Visit: Payer: Self-pay

## 2018-11-11 ENCOUNTER — Encounter (HOSPITAL_COMMUNITY): Payer: Self-pay | Admitting: Psychiatry

## 2018-11-11 DIAGNOSIS — F330 Major depressive disorder, recurrent, mild: Secondary | ICD-10-CM | POA: Diagnosis not present

## 2018-11-11 MED ORDER — CLONAZEPAM 1 MG PO TABS: 1.0000 mg | ORAL_TABLET | Freq: Two times a day (BID) | ORAL | 1 refills | Status: DC

## 2018-11-11 MED ORDER — BUPROPION HCL ER (XL) 300 MG PO TB24: 300.0000 mg | ORAL_TABLET | Freq: Every day | ORAL | 0 refills | Status: DC

## 2018-11-11 NOTE — Patient Instructions (Signed)
1. Continue duloxetine 60 mg daily  2.Continuebupropion 300 mg daily 3.Continueclonazepam 1 mg twice a day as needed for anxiety  4.Next appointment: 8/7 at 11

## 2018-11-30 ENCOUNTER — Telehealth (HOSPITAL_COMMUNITY): Payer: Self-pay

## 2018-11-30 NOTE — Telephone Encounter (Signed)
Patient called and stated that she would like to   have another anti-depressant medication added to her Welllbutrin 300mg . She stated that she is starting to have depression issues. Please review and advise. Thank you.

## 2018-11-30 NOTE — Telephone Encounter (Signed)
I would advise that she tries bupropion 450 mg daily if she is interested.

## 2018-12-01 ENCOUNTER — Other Ambulatory Visit (HOSPITAL_COMMUNITY): Payer: Self-pay | Admitting: Psychiatry

## 2018-12-01 MED ORDER — BUPROPION HCL ER (XL) 150 MG PO TB24: ORAL_TABLET | ORAL | 0 refills | Status: DC

## 2018-12-01 NOTE — Telephone Encounter (Signed)
Noted, thanks!

## 2018-12-01 NOTE — Telephone Encounter (Signed)
I called patient back to relay the message from the doctor. Patient was agreeable to increasing her dosage of Bupropion from 300mg  to 450mg . I called and spoke with the pharmacy and they stated that the medication does not come in 450mg  dosage, therefore, they added 150mg  to the existing 300mg  to equal 450mg  total. Patient stated she is going to try that for a couple weeks to see how she does and let us know if she has any problems with the increased dosage. Thank you.

## 2018-12-09 ENCOUNTER — Telehealth (HOSPITAL_COMMUNITY): Payer: Self-pay

## 2018-12-09 NOTE — Telephone Encounter (Signed)
I can prescribe hydroxyzine as needed of anxiety if she is interested. Also advise her to have sooner appointment- I can see her 7/13 at 2:20. Also make sure that there is no safety concern (SI) and discussed emergent resources if needed.

## 2018-12-09 NOTE — Telephone Encounter (Signed)
Patient called extremely upset and crying with extreme anxiety. She stated that her cat she had for 12 years and was her best friend passed away. She stated that it is a lot for her and it is hitting her hard. She requested something to be prescribed for her. Please review and advise. Thank you.

## 2018-12-10 ENCOUNTER — Other Ambulatory Visit (HOSPITAL_COMMUNITY): Payer: Self-pay | Admitting: Psychiatry

## 2018-12-10 MED ORDER — HYDROXYZINE HCL 25 MG PO TABS: 25.0000 mg | ORAL_TABLET | Freq: Two times a day (BID) | ORAL | 0 refills | Status: DC | PRN

## 2018-12-10 NOTE — Telephone Encounter (Signed)
I guess she meant clonazepam. (I would not recommend as she is already on clonazepam). Advise her to take hydroxyzine 25 mg twice a day as needed for anxiety (medication ordered). Both hydroxyzine and clonazepam can make her drowsy; so advise her to limit its use if she has drowsiness.
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
## 2018-12-10 NOTE — Telephone Encounter (Signed)
Patient called and stated that she would rather try Valium or she would be fine with the Hydroxyzine with Valium to get her through until her appointment with doctor on Monday 7/13. I made certain that she had no safety concerns and promised me she would not harm herself. Patient uses [facility]. Please review and advise. Thank you.

## 2018-12-13 ENCOUNTER — Telehealth (HOSPITAL_COMMUNITY): Payer: Self-pay

## 2018-12-13 ENCOUNTER — Encounter (HOSPITAL_COMMUNITY): Payer: Self-pay | Admitting: Psychiatry

## 2018-12-13 ENCOUNTER — Ambulatory Visit (INDEPENDENT_AMBULATORY_CARE_PROVIDER_SITE_OTHER): Payer: Medicaid Other | Admitting: Psychiatry

## 2018-12-13 ENCOUNTER — Other Ambulatory Visit: Payer: Self-pay

## 2018-12-13 DIAGNOSIS — F331 Major depressive disorder, recurrent, moderate: Secondary | ICD-10-CM | POA: Diagnosis not present

## 2018-12-13 MED ORDER — QUETIAPINE FUMARATE 25 MG PO TABS: 25.0000 mg | ORAL_TABLET | Freq: Every day | ORAL | 0 refills | Status: DC

## 2018-12-13 NOTE — Telephone Encounter (Signed)
Medication management - Telephone call with pt to follow up on 2 messages she left over the weekend that she was having increased anxiety after her 56 year old cat died and did not feel hydroxyzine was helping.  Discussed with patient that she is scheduled to have her virtual visit with Dr. Modesta Messing today at 2:20 pm and can discuss with provider at that time.  Patient in agreement with this as not currently in a crisis.  Patient to call back if problems prior to appt.

## 2018-12-13 NOTE — Telephone Encounter (Signed)
We can try increasing up the dose of buproion (450 mg)- it might take a little time for medication to be effective, though. I would not have additional recommendation given the medication regimen she has already been on.

## 2018-12-13 NOTE — Telephone Encounter (Signed)
Medication management - Telephone call to patient to inform at this time Dr.Hisada would only recommend the recent increase in Wellbutrin to 450mg  a day as she has started but warned this may take a little more time time to be effective.  Patient stated understanding and will call back in the coming week if symptoms persist or if anything worsens.  Patient reported understanding recommendation and will keep Dr. Modesta Messing informed if not helpful.

## 2018-12-13 NOTE — Telephone Encounter (Signed)
Medication problem - Pt called stating she had just got off a video call with Dr. Modesta Messing and did not realize she was putting her on Seroquel.  States she did not recognize the generic name and does not want to be on this due to potential weight gain.  Patient requested Dr. Modesta Messing put her on something else instead of Seroquel and to let her know what this option may be.

## 2018-12-13 NOTE — Patient Instructions (Signed)
1. Continue duloxetine 60 mg daily  2.Continuebupropion 300 mg daily 3. Start quetiapine 25 mg at night  4.Continueclonazepam 1 mg twice a day as needed for anxiety  5.Next appointment: 8/7 at 11 AM

## 2018-12-22 ENCOUNTER — Telehealth (HOSPITAL_COMMUNITY): Payer: Self-pay | Admitting: *Deleted

## 2018-12-22 NOTE — Telephone Encounter (Signed)
DR HISADA PATIENT CALLED & REQUESTED IF SHE COULD HAVE A MEDICATION THAT WOULD HELP TO TAKE THE EDGE OFF SHE'S STILL HAVING DIFFICULTY  WITH THE LOST OF HER PET CAT.Wellbutrin increased to 450mg  a day started but it is ineffective.

## 2018-12-22 NOTE — Telephone Encounter (Signed)
I would advise to try quetiapine 25 mg at night as discussed before if she is interested. Hope upcoming therapy would be helpful for her if she is not interested in this option.

## 2018-12-22 NOTE — Telephone Encounter (Signed)
Spoke with patient & informed per provider Dr Modesta Messing : I would advise to try quetiapine 25 mg at night as discussed before if she is interested. Hope upcoming therapy would be helpful for her if she is not interested in this option.  Patient is concerned that this can cause weight gain & she has had weight problems. That she would think about it  & let us know

## 2018-12-23 ENCOUNTER — Ambulatory Visit (HOSPITAL_COMMUNITY): Payer: Medicaid Other | Admitting: Psychiatry

## 2018-12-27 ENCOUNTER — Other Ambulatory Visit: Payer: Self-pay

## 2018-12-27 ENCOUNTER — Ambulatory Visit (INDEPENDENT_AMBULATORY_CARE_PROVIDER_SITE_OTHER): Payer: Medicaid Other | Admitting: Psychiatry

## 2018-12-27 ENCOUNTER — Encounter (HOSPITAL_COMMUNITY): Payer: Self-pay | Admitting: Psychiatry

## 2018-12-27 DIAGNOSIS — F331 Major depressive disorder, recurrent, moderate: Secondary | ICD-10-CM
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
## 2018-12-29 ENCOUNTER — Telehealth (HOSPITAL_COMMUNITY): Payer: Self-pay | Admitting: Psychiatry
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
## 2018-12-29 NOTE — Telephone Encounter (Signed)
Therapist returned call to patient who stated she wanted therapist to know she hadn't heard from Dr. Modesta Messing. Therapist informed patient her Dr. Modesta Messing is planning to follow up with her regarding concerns about medication during her appointment next week on 01/07/2019. Patient reports she is having a particularly hard time in the evenings and still needs something. Therapist advised patient to call and schedule earlier appointment with Dr. Modesta Messing.

## 2018-12-30 ENCOUNTER — Other Ambulatory Visit: Payer: Self-pay

## 2018-12-30 ENCOUNTER — Ambulatory Visit (INDEPENDENT_AMBULATORY_CARE_PROVIDER_SITE_OTHER): Payer: Medicaid Other | Admitting: Psychiatry

## 2018-12-30 ENCOUNTER — Encounter (HOSPITAL_COMMUNITY): Payer: Self-pay | Admitting: Psychiatry

## 2018-12-30 DIAGNOSIS — F331 Major depressive disorder, recurrent, moderate: Secondary | ICD-10-CM

## 2018-12-30 MED ORDER — BUPROPION HCL ER (XL) 150 MG PO TB24: ORAL_TABLET | ORAL | 0 refills | Status: DC

## 2018-12-30 NOTE — Patient Instructions (Signed)
1. Continue duloxetine 60 mg daily  2.Continuebupropion 450 mg daily 3. Continueclonazepam 1 mg twice a day as needed for anxiety  5.Next appointment:8/27 at 8:40

## 2019-01-07 ENCOUNTER — Ambulatory Visit (HOSPITAL_COMMUNITY): Payer: Medicaid Other | Admitting: Psychiatry

## 2019-01-11 ENCOUNTER — Other Ambulatory Visit: Payer: Self-pay

## 2019-01-11 ENCOUNTER — Ambulatory Visit (INDEPENDENT_AMBULATORY_CARE_PROVIDER_SITE_OTHER): Payer: Medicaid Other | Admitting: Psychiatry

## 2019-01-11 DIAGNOSIS — F331 Major depressive disorder, recurrent, moderate: Secondary | ICD-10-CM

## 2019-01-11 NOTE — Progress Notes (Signed)
Virtual Visit via Video Note  I connected with Rebecca Barrera on 01/11/19 at  2:00 PM EDT by a video enabled telemedicine application and verified that I am speaking with the correct person using two identifiers.   I discussed the limitations of evaluation and management by telemedicine and the availability of in person appointments. The patient expressed understanding and agreed to proceed.  I provided  45 minutes of non-face-to-face time during this encounter.   Alonza Smoker, LCSW    THERAPIST PROGRESS NOTE  Session Time: Tuesday 01/11/2019 2:10 PM - 2:55 PM   Participation Level: Active  Behavioral Response: CasualAlertDepressed/tearful  Type of Therapy: Individual Therapy  Treatment Goals addressed: Establish rapport, begin healthy grieving process  Interventions: Supportive, CBT  Summary: Rebecca Barrera is a 58 y.o. female who is referred for services by psychiatrist Dr. Modesta Messing due to patient experiencing symptoms of depression. She denies any psychiatric hospitalizations. She participated in outpatient therapy at [facility] for several years. She reports grief and loss regarding having her cat euthanized on 12/08/2018 due to lung problems.  Patient had her cat for almost 12 years.  She also reports worry about her mother who is in transition regarding end-of-life as mother has dementia and kidney failure.  Patient worries about how she will cope with the loss of her mother when it happens.  Patient reports crying spells, deep sadness, worry, and anxiety.  Patient last was seen via virtual visit about 2 to 3 weeks ago.  She reports no change in symptoms since last session.  She continues to experience daily crying spells and reports the evenings are especially difficult.  She reports her cat was her best friend.  Her son has gotten her another kitten and patient says this has been helpful.  However, she states she does not want to forget about the cat that died.  Patient also continues to  worry about how she will handle her mother's death when it happens.  Patient reports her mother's health currently is stable.     Suicidal/Homicidal: Nowithout intent/plan  Therapist Response: Established rapport, reviewed symptoms, discussed stressors, facilitated expression of thoughts and feelings, validated feelings, developed treatment plan, obtained patient's permission to initial plan for patient as this was a virtual visit, began to discuss the grief process, assisted patient began to identify ways she has avoided dealing with feelings and how avoidance affects the grief process  Plan: Return again in 2 weeks.  Diagnosis: Axis I: MDD, Recurrent, Moderate    Axis II: No diagnosis    Alonza Smoker, LCSW 01/11/2019
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
## 2019-01-18 ENCOUNTER — Ambulatory Visit (INDEPENDENT_AMBULATORY_CARE_PROVIDER_SITE_OTHER): Payer: Medicaid Other | Admitting: Psychiatry

## 2019-01-18 ENCOUNTER — Encounter (HOSPITAL_COMMUNITY): Payer: Self-pay | Admitting: Psychiatry

## 2019-01-18 ENCOUNTER — Other Ambulatory Visit (HOSPITAL_COMMUNITY): Payer: Self-pay | Admitting: Psychiatry

## 2019-01-18 ENCOUNTER — Telehealth (HOSPITAL_COMMUNITY): Payer: Self-pay | Admitting: *Deleted

## 2019-01-18 ENCOUNTER — Other Ambulatory Visit: Payer: Self-pay

## 2019-01-18 DIAGNOSIS — F331 Major depressive disorder, recurrent, moderate: Secondary | ICD-10-CM

## 2019-01-18 MED ORDER — MIRTAZAPINE 15 MG PO TABS: ORAL_TABLET | ORAL | 1 refills | Status: DC

## 2019-01-18 MED ORDER — ARIPIPRAZOLE 2 MG PO TABS: 2.0000 mg | ORAL_TABLET | Freq: Every day | ORAL | 0 refills | Status: DC

## 2019-01-18 MED ORDER — CLONAZEPAM 1 MG PO TABS: 1.0000 mg | ORAL_TABLET | Freq: Two times a day (BID) | ORAL | 0 refills | Status: DC

## 2019-01-18 NOTE — Telephone Encounter (Signed)
PATIENT CALLED BACK STATED SHE REMEMBERED THAT DR [facility] ONCE PRESCRIBED ABILIFY  & IT CAUSED HER TO HAVE REALLY BAD H/A. SHE PREFERS TO TRY THE OTHER OPTION

## 2019-01-18 NOTE — Telephone Encounter (Signed)
DONE

## 2019-01-18 NOTE — Patient Instructions (Signed)
1. Continue duloxetine 60 mg daily 2.Continuebupropion 450 mg daily  3. Start Abilify 2 mg daily  4. Continueclonazepam 1 mg twice a day as needed for anxiety  5.Next appointment: 9/28 at 2 PM

## 2019-01-18 NOTE — Telephone Encounter (Signed)
-   Mirtazapine is ordered. Advise her to take 7.5 mg at night for one week, then 15 mg at night. Potential side effect includes drowsiness and increase in appetite as we discussed.  - Please contact the pharmacy to discontinue Abilify order.

## 2019-01-19 ENCOUNTER — Telehealth (HOSPITAL_COMMUNITY): Payer: Self-pay | Admitting: *Deleted

## 2019-01-19 ENCOUNTER — Other Ambulatory Visit (HOSPITAL_COMMUNITY): Payer: Self-pay | Admitting: Psychiatry

## 2019-01-19 MED ORDER — DULOXETINE HCL 30 MG PO CPEP: 30.0000 mg | ORAL_CAPSULE | Freq: Every day | ORAL | 0 refills | Status: DC

## 2019-01-19 MED ORDER — ESCITALOPRAM OXALATE 10 MG PO TABS: ORAL_TABLET | ORAL | 1 refills | Status: DC

## 2019-01-19 NOTE — Telephone Encounter (Signed)
Patient called "stating that the  Mirtazapine "making her sick as a dog" & the Abilify is making her have headaches" She can't take has stopped  taking the medication

## 2019-01-19 NOTE — Telephone Encounter (Signed)
Advise her to discontinue mirtazapine. The other option is to switch from duloxetine to sertraline or escitalopram if she is interested (I recall that she was not comfortable with this option when we discussed at her last visit). Let me know if she is interested. Otherwise, make sure she continues to work with [name] for therapy.

## 2019-01-19 NOTE — Telephone Encounter (Signed)
Advise her for the following.  Week 1: decrease duloxetine 30 mg daily, start lexapro 5 mg daily Week 2: discontinue duloxetine, increase lexapro 10 mg daily.  Ordered duloxetine 30 mg cap (7 days only) and lexapro. Advise her to contact the clinic if any side effects from this change.

## 2019-01-19 NOTE — Telephone Encounter (Signed)
Re-opened previous note

## 2019-01-19 NOTE — Telephone Encounter (Signed)
SPOKE WITH PATIENT  & SHE WOULD LIKE TO TRY THE LEXAPRO

## 2019-01-20 ENCOUNTER — Telehealth (HOSPITAL_COMMUNITY): Payer: Self-pay | Admitting: *Deleted

## 2019-01-20 NOTE — Telephone Encounter (Signed)
Spoke with patient & informed pe provider she would NOT recommend taking BOTH  Cymbalta & Lexapro.  after hearing this she then decides to try the med change prviously discussed on  8/19 & will call back ~~ 2 weeks to update

## 2019-01-20 NOTE — Telephone Encounter (Signed)
Spoke with patient  & informed per provider: you would NOT  Recommended taking both Cymbalta & LEXAPRO.   after hearing that she then decides that will go ahead  & try as previously discussed on  8/19 . And will call back after trying the med change in ~~ 2 weeks

## 2019-01-20 NOTE — Telephone Encounter (Signed)
Dr Modesta Messing This patient will need a call from you . She LVM thru the night  & now stating /requesting that she continue all her med's  ( Cymbalta, Wellbutrin, & Clonazepam) along with the LEXAPRO . She states she been on the med's & except for the mirtazapine they have worked well. And that she need's Dr Modesta Messing to let me know what she thinks

## 2019-01-20 NOTE — Telephone Encounter (Signed)
Please tell her that I would NOT recommend taking both Cymbalta and Lexapro. If she prefers to stay on Cymbalta instead of Lexapro, she can continue the same dose of Cymbalta.

## 2019-01-24 ENCOUNTER — Telehealth (HOSPITAL_COMMUNITY): Payer: Self-pay | Admitting: *Deleted

## 2019-01-24 ENCOUNTER — Other Ambulatory Visit (HOSPITAL_COMMUNITY): Payer: Self-pay | Admitting: Psychiatry

## 2019-01-24 MED ORDER — ARIPIPRAZOLE 2 MG PO TABS: 2.0000 mg | ORAL_TABLET | Freq: Every day | ORAL | 0 refills | Status: DC

## 2019-01-24 NOTE — Telephone Encounter (Signed)
Discussed with the patient. After having discussion at length regarding NOT to try duloxetine with other antidepressant to avoid potential side effect, she is interested in trying Abilify. If it were to cause any side effect, she agrees to stay on current medication regimen.  - Continue duloxetine 60 mg daily  - Discontinue lexapro - Start Abilify 2 mg daily

## 2019-01-24 NOTE — Telephone Encounter (Signed)
Patient has LVM she's on Week 1: decrease duloxetine 30 mg daily, start lexapro 5 mg daily . And she stated that 'she's having really bad head dizziness' & that 'she's not feeling well'. States her body is use to the Cymbalta 60 mg  & she can't take the 30 mg.. She stated she needs to be back on the 60 mg Cymbalta along with a anti-depressant compatible with it. Requested call from the Dr.

## 2019-01-25 ENCOUNTER — Ambulatory Visit (INDEPENDENT_AMBULATORY_CARE_PROVIDER_SITE_OTHER): Payer: Medicaid Other | Admitting: Psychiatry

## 2019-01-25 ENCOUNTER — Other Ambulatory Visit: Payer: Self-pay

## 2019-01-25 DIAGNOSIS — F331 Major depressive disorder, recurrent, moderate: Secondary | ICD-10-CM | POA: Diagnosis not present

## 2019-01-25 NOTE — Progress Notes (Signed)
Virtual Visit via Video Note  I connected with Rebecca Barrera on 01/25/19 at 2:15 PM EDT by a video enabled telemedicine application and verified that I am speaking with the correct person using two identifiers.   I discussed the limitations of evaluation and management by telemedicine and the availability of in person appointments. The patient expressed understanding and agreed to proceed.   I provided 44 minutes of non-face-to-face time during this encounter.   Alonza Smoker, LCSW   THERAPIST PROGRESS NOTE  Session Time: Tuesday 01/25/2019 2:15 PM - 2:59 PM  Participation Level: Active  Behavioral Response: CasualAlertDepressed/tearful  Type of Therapy: Individual Therapy  Treatment Goals addressed: begin healthy grieving process  Interventions: Supportive, CBT  Summary: Rebecca Barrera is a 58 y.o. female who is referred for services by psychiatrist Dr. Modesta Messing due to patient experiencing symptoms of depression. She denies any psychiatric hospitalizations. She participated in outpatient therapy at [facility] for several years. She reports grief and loss regarding having her cat euthanized on 12/08/2018 due to lung problems.  Patient had her cat for almost 12 years.  She also reports worry about her mother who is in transition regarding end-of-life as mother has dementia and kidney failure.  Patient worries about how she will cope with the loss of her mother when it happens.  Patient reports crying spells, deep sadness, worry, and anxiety.  Patient last was seen via virtual visit about 2 to 3 weeks ago.  She reports continuing to work with Dr. Modesta Messing on medication management. Per her report, she began taking Abilify today. She reports making efforts to allow herself to experience feelings related to grief rather than avoiding feelings. She says this has helped and reports decreased frequency of crying spells. She reports decreased feelings of guilt related to having her cat euthanized.She also  reports internet site for people who have lost cats also has been helpful. She reports she now is developing more of a connection with her new kitten. She also reports she is going outside more. She continues to express concern about mother.   Suicidal/Homicidal: Nowithout intent/plan  Therapist Response: reviewed symptoms, praised and reinforced patient approaching rather than avoiding her feelings, discussed the effects, facilitated expression of thoughts and feelings about loss of cat, assisted patient examine her connection with her cat and verbalize her dependence on her cat, discussed her connection with her new kitten and assisted patient identify healthy attachment, began to discuss stages of grief, assigned patient to review stages of grief in preparation for next session (therapist will mail handout to patient),   Plan: Return again in 2 weeks.  Diagnosis: Axis I: MDD, Recurrent, Moderate    Axis II: No diagnosis    Alonza Smoker, LCSW 01/25/2019

## 2019-01-27 ENCOUNTER — Ambulatory Visit (HOSPITAL_COMMUNITY): Payer: Medicaid Other | Admitting: Psychiatry

## 2019-02-08 ENCOUNTER — Other Ambulatory Visit: Payer: Self-pay

## 2019-02-08 ENCOUNTER — Ambulatory Visit (INDEPENDENT_AMBULATORY_CARE_PROVIDER_SITE_OTHER): Payer: Medicaid Other | Admitting: Psychiatry

## 2019-02-08 DIAGNOSIS — F331 Major depressive disorder, recurrent, moderate: Secondary | ICD-10-CM | POA: Diagnosis not present
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
## 2019-02-22 ENCOUNTER — Ambulatory Visit (INDEPENDENT_AMBULATORY_CARE_PROVIDER_SITE_OTHER): Payer: Medicaid Other | Admitting: Psychiatry

## 2019-02-22 ENCOUNTER — Other Ambulatory Visit: Payer: Self-pay

## 2019-02-22 DIAGNOSIS — F331 Major depressive disorder, recurrent, moderate: Secondary | ICD-10-CM

## 2019-02-22 NOTE — Progress Notes (Signed)
Virtual Visit via Video Note  I connected with Rebecca Barrera on 02/22/19 at 2:15 PM EDT by a video enabled telemedicine application and verified that I am speaking with the correct person using two identifiers.   I discussed the limitations of evaluation and management by telemedicine and the availability of in person appointments. The patient expressed understanding and agreed to proce  I provided 45 minutes of non-face-to-face time during this encounter.   Alonza Smoker, LCSW   THERAPIST PROGRESS NOTE  Session Time: Tuesday 02/22/2019 2:15 PM - 3:00 PM   Participation Level: Active  Behavioral Response: CasualAlert/less depressed  Type of Therapy: Individual Therapy  Treatment Goals addressed: begin healthy grieving process  Interventions: Supportive, CBT  Summary: Rebecca Barrera is a 58 y.o. female who is referred for services by psychiatrist Dr. Modesta Messing due to patient experiencing symptoms of depression. She denies any psychiatric hospitalizations. She participated in outpatient therapy at [facility] for several years. She reports grief and loss regarding having her cat euthanized on 12/08/2018 due to lung problems.  Patient had her cat for almost 12 years.  She also reports worry about her mother who is in transition regarding end-of-life as mother has dementia and kidney failure.  Patient worries about how she will cope with the loss of her mother when it happens.  Patient reports crying spells, deep sadness, worry, and anxiety.  Patient last was seen via virtual visit about 2 weeks ago.  She reports less depressed mood and decreased tearfulness. She also reports increased involvement and interest in activities including doing household chores. She also reports attending a dental appointment. She still misses her cat but is able to remember and verbalize positive memories of her cat. She is pleased with her progress but expresses concern that she could have a relapse.  She expresses guilt and  regret about not being in the room when her cat euthanized. She states she wasn't strong enough and that she would not have been able to handle it.   Suicidal/Homicidal: Nowithout intent/plan  Therapist Response: reviewed symptoms, praised and reinforced patient's increased behavioral activation, praised and reinforced patient's efforts in approaching rather than avoiding her feelings, discussed effects,  discussed lapse versus relapse and ways to intervene to avoid relapse, facilitated patient identifying and verbalizing feelings of regret and guilt,assisted patient identify thoughts evoking inappropriate guilt and replace with more helpful thoughts, also discussed the grieving process particularly integrated grief, assigned patient to review handout on the grieving process (therapist will mail to patient) began to assist patient identify and verbalize positive memories of her cat.  Plan: Return again in 2 weeks.  Diagnosis: Axis I: MDD, Recurrent, Moderate    Axis II: No diagnosis    Alonza Smoker, LCSW 02/22/2019
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
## 2019-02-28 ENCOUNTER — Ambulatory Visit (INDEPENDENT_AMBULATORY_CARE_PROVIDER_SITE_OTHER): Payer: Medicaid Other | Admitting: Psychiatry

## 2019-02-28 ENCOUNTER — Encounter (HOSPITAL_COMMUNITY): Payer: Self-pay | Admitting: Psychiatry

## 2019-02-28 ENCOUNTER — Other Ambulatory Visit: Payer: Self-pay

## 2019-02-28 DIAGNOSIS — F331 Major depressive disorder, recurrent, moderate: Secondary | ICD-10-CM

## 2019-02-28 MED ORDER — BUPROPION HCL ER (XL) 150 MG PO TB24: ORAL_TABLET | ORAL | 1 refills | Status: DC

## 2019-02-28 MED ORDER — BUPROPION HCL ER (XL) 300 MG PO TB24: 300.0000 mg | ORAL_TABLET | Freq: Every day | ORAL | 1 refills | Status: DC

## 2019-02-28 MED ORDER — CLONAZEPAM 1 MG PO TABS: 1.0000 mg | ORAL_TABLET | Freq: Two times a day (BID) | ORAL | 1 refills | Status: DC

## 2019-02-28 NOTE — Patient Instructions (Addendum)
1. Continue duloxetine 60 mg daily 2.Continuebupropion450 mg daily 3. Start Abilify 2 mg daily  4.Continueclonazepam 1 mg twice a day as needed for anxiety 5.Next appointment: 11/10 at 11:30

## 2019-03-01 ENCOUNTER — Other Ambulatory Visit (HOSPITAL_COMMUNITY): Payer: Self-pay | Admitting: Psychiatry

## 2019-03-01 ENCOUNTER — Telehealth (HOSPITAL_COMMUNITY): Payer: Self-pay | Admitting: *Deleted

## 2019-03-01 MED ORDER — ARIPIPRAZOLE 2 MG PO TABS: 2.0000 mg | ORAL_TABLET | Freq: Every day | ORAL | 1 refills | Status: DC

## 2019-03-01 MED ORDER — BUPROPION HCL ER (XL) 150 MG PO TB24: ORAL_TABLET | ORAL | 1 refills | Status: DC

## 2019-03-01 MED ORDER — BUPROPION HCL ER (XL) 300 MG PO TB24: 300.0000 mg | ORAL_TABLET | Freq: Every day | ORAL | 1 refills | Status: DC

## 2019-03-01 NOTE — Telephone Encounter (Signed)
Spoke with the Rx & the Wellbutrin 150 mg last sent 01-31-2019 w/ 0 refills & Abilify 2 mg last sent 01-24-2019 w/ 0 refills

## 2019-03-01 NOTE — Telephone Encounter (Signed)
Spoke with patient & she stated that she didn't have a refill sent for her Wellbutrin 150 mg & the Abilify 2 mg.  Spoke with the Rx & the Wellbutrin 150 mg last sent 01-31-2019 w/  0 refills  & Abilify 2 mg last sent 01-24-2019  w/  0 refills

## 2019-03-01 NOTE — Telephone Encounter (Signed)
Re ordered

## 2019-03-01 NOTE — Telephone Encounter (Signed)
Bupropion/wellbutrin was sent in yesterday with refill. Please verify it with the pharmacy. She has not started Abilify when it was ordered in August; so advise her to fill that medication. Will send a refill in a month if she tolerates this  medication.

## 2019-03-08 ENCOUNTER — Ambulatory Visit (INDEPENDENT_AMBULATORY_CARE_PROVIDER_SITE_OTHER): Payer: Medicaid Other | Admitting: Psychiatry

## 2019-03-08 ENCOUNTER — Other Ambulatory Visit: Payer: Self-pay

## 2019-03-08 ENCOUNTER — Telehealth (HOSPITAL_COMMUNITY): Payer: Self-pay | Admitting: *Deleted

## 2019-03-08 DIAGNOSIS — F331 Major depressive disorder, recurrent, moderate: Secondary | ICD-10-CM

## 2019-03-08 NOTE — Telephone Encounter (Signed)
Advise her to decrease Abilify 1 mg per day to see if the side effect subsides. If she continues to have similar symptoms, advise her to discontinue Abilify.

## 2019-03-08 NOTE — Progress Notes (Signed)
Virtual Visit via Telephone Note  I connected with Rebecca Barrera on 03/08/19 at 2:10 PM EDT by telephone and verified that I am speaking with the correct person using two identifiers.   I discussed the limitations, risks, security and privacy concerns of performing an evaluation and management service by telephone and the availability of in person appointments. I also discussed with the patient that there may be a patient responsible charge related to this service. The patient expressed understanding and agreed to proceed.  I provided 44 minutes of non-face-to-face time during this encounter.   Alonza Smoker, LCSW   THERAPIST PROGRESS NOTE  Session Time: Tuesday 03/08/2019 2:10 PM - 2:54 PM   Participation Level: Active  Behavioral Response: CasualAlert/less depressed  Type of Therapy: Individual Therapy  Treatment Goals addressed: begin healthy grieving process  Interventions: Supportive, CBT  Summary: Rebecca Barrera is a 58 y.o. female who is referred for services by psychiatrist Dr. Modesta Messing due to patient experiencing symptoms of depression. She denies any psychiatric hospitalizations. She participated in outpatient therapy at [facility] for several years. She reports grief and loss regarding having her cat euthanized on 12/08/2018 due to lung problems.  Patient had her cat for almost 12 years.  She also reports worry about her mother who is in transition regarding end-of-life as mother has dementia and kidney failure.  Patient worries about how she will cope with the loss of her mother when it happens.  Patient reports crying spells, deep sadness, worry, and anxiety.  Patient last was seen via virtual visit about 2 weeks ago.  She reports taking ablilfy for the past week and says the medication has decreased her motivation. She also reports the medication has caused her to experience vivid weird dreams and headaches.  She agrees to contact CMA regarding these symptoms and follow-up with  psychiatrist Dr. Modesta Messing.  She she continues to experience tearfulness at times regarding the loss of her cat but continuing to cope better.  She reports decreased guilt regarding not being in the room when her cat was euthanized.  She expresses increased acceptance of the loss and is able to verbalize more positive memories of her cat in session today.  She reports using statements related to her spirituality have helped her have peace regarding loss of cat. she is bonding more with her new kitten per her report.  She maintains involvement in activities, she denies any worry about her children.  She says mom is doing well and is stable.  She states she is not in denial about mother's eventual death but right now  focusing on her mother is still alive.   Suicidal/Homicidal: Nowithout intent/plan  Therapist Response: reviewed symptoms, praised and reinforced patient's increased behavioral activation, praised and reinforced patient's efforts in approaching rather than avoiding her feelings, discussed effects, reviewed the grieving process, began to discuss stages of grief, assigned patient to review handout on the stages of grief (therapist will mail to patient) in preparation for next session.    Plan: Return again in 2 weeks.  Diagnosis: Axis I: MDD, Recurrent, Moderate    Axis II: No diagnosis    Alonza Smoker, LCSW 03/08/2019

## 2019-03-08 NOTE — Telephone Encounter (Signed)
PATIENT CALLED AFTER HAVING VISIT WITH THERAPIST [name] WHICH INFORMED HER TO CALL. PATIENT IS HAVING PROBLEMS WITH ABILIFY THAT SHE HAS "BEEN JUST PUSHING THRU & TAKING AS PRESCRIBED".  PATIENT STATES "SHE HAS NO MOTIVATION, EXTREME EXHAUSTION (AS IF SHE WORKED  2 SHIFTS BACK TO BACK) & VERY VIVID DREAMS THAT SHE DOESN'T EVEN WANT TO TALK OR THINK ABOUT."   NEXT APPOINTMENT IS 04/12/2019

## 2019-03-08 NOTE — Telephone Encounter (Signed)
LVM:PER PROVIDER: Advise her to decrease Abilify 1 mg per day to see if the side effect subsides. If she continues to have similar symptoms, advise her to discontinue Abilify.

## 2019-03-14 ENCOUNTER — Telehealth (HOSPITAL_COMMUNITY): Payer: Self-pay | Admitting: *Deleted

## 2019-03-14 NOTE — Telephone Encounter (Signed)
Patient called  &  LVM stating that she did as advise concerning the Abilify: Advise her to decrease Abilify 1 mg per day to see if the side effect subsides. If she continues to have similar symptoms, advise her to discontinue Abilify.  Patient states the 1 mg causing same symptoms even tried 1/2 tab still causing same symptoms so she has Stopped taking as instructed if same symptoms continued

## 2019-03-22 ENCOUNTER — Other Ambulatory Visit: Payer: Self-pay

## 2019-03-22 ENCOUNTER — Ambulatory Visit (INDEPENDENT_AMBULATORY_CARE_PROVIDER_SITE_OTHER): Payer: Medicaid Other | Admitting: Psychiatry

## 2019-03-22 DIAGNOSIS — F331 Major depressive disorder, recurrent, moderate: Secondary | ICD-10-CM

## 2019-03-22 NOTE — Progress Notes (Signed)
Virtual Visit via Video Note  I connected with Rebecca Barrera on 03/22/19 at 2:10 PM EDT by a video enabled telemedicine application and verified that I am speaking with the correct person using two identifiers.   I discussed the limitations of evaluation and management by telemedicine and the availability of in person appointments. The patient expressed understanding and agreed to proceed.   I provided 45 minutes of non-face-to-face time during this encounter.   Alonza Smoker, LCSW   THERAPIST PROGRESS NOTE  Session Time: Tuesday 03/22/2019 2:10 PM -  2:55 PM  Participation Level: Active  Behavioral Response: CasualAlert/less depressed  Type of Therapy: Individual Therapy  Treatment Goals addressed: begin healthy grieving process  Interventions: Supportive, CBT  Summary: Rebecca Barrera is a 58 y.o. female who is referred for services by psychiatrist Dr. Modesta Messing due to patient experiencing symptoms of depression. She denies any psychiatric hospitalizations. She participated in outpatient therapy at [facility] for several years. She reports grief and loss regarding having her cat euthanized on 12/08/2018 due to lung problems.  Patient had her cat for almost 12 years.  She also reports worry about her mother who is in transition regarding end-of-life as mother has dementia and kidney failure.  Patient worries about how she will cope with the loss of her mother when it happens.  Patient reports crying spells, deep sadness, worry, and anxiety.  Patient last was seen via virtual visit about 2 weeks ago.  She reports improved mood and decreased crying spells since last session.  She continues to think about her deceased cat but reports thoughts have been less frequent and less intense.  She reports bonding more with her new kitten.  She expresses less guilt and regret.  She reports increased thoughts about mother who recently had to go to the ER due to a drop in blood pressure and tested  positive for  COVID-19.  A second test has been completed but results have not been provided yet.  Patient reports she and her brother had to make a decision about a DNR order should mother's condition worsen.  She reports this was difficult but being able to make a decision with her brother.  She reports being pleased with the way she is coping now but fears she will relapse when her mother dies.  Patient maintains involvement in activity. Suicidal/Homicidal: Nowithout intent/plan  Therapist Response: reviewed symptoms, praised and reinforced patient's increased behavioral activation, praised and reinforced patient's efforts in approaching rather than avoiding her feelings, discussed effects, discussed stages of grief and experiences patient has had with each stage, assisted patient identify and verbalize feelings about mother's condition, validated feelings, assisted patient identify skills she has used in previous loss and adversity and ways to use in future adversity/loss  Plan: Return again in 2 weeks.  Diagnosis: Axis I: MDD, Recurrent, Moderate    Axis II: No diagnosis    Alonza Smoker, LCSW 03/22/2019

## 2019-04-05 ENCOUNTER — Ambulatory Visit (INDEPENDENT_AMBULATORY_CARE_PROVIDER_SITE_OTHER): Payer: Medicaid Other | Admitting: Psychiatry

## 2019-04-05 ENCOUNTER — Other Ambulatory Visit: Payer: Self-pay

## 2019-04-05 DIAGNOSIS — F331 Major depressive disorder, recurrent, moderate: Secondary | ICD-10-CM | POA: Diagnosis not present

## 2019-04-05 NOTE — Progress Notes (Signed)
Virtual Visit via Video Note  I connected with [name] on 04/05/19 at 2:20 PM EST by a video enabled telemedicine application and verified that I am speaking with the correct person using two identifiers.   I discussed the limitations of evaluation and management by telemedicine and the availability of in person appointments. The patient expressed understanding and agreed to proceed.   I provided 45  minutes of non-face-to-face time during this encounter.   Alonza Smoker, LCSW  THERAPIST PROGRESS NOTE  Session Time: Tuesday 04/05/2019 2:20 PM - 3:05 PM   Participation Level: Active  Behavioral Response: CasualAlert/less depressed  Type of Therapy: Individual Therapy  Treatment Goals addressed: begin healthy grieving process  Interventions: Supportive, CBT  Summary: Rebecca Barrera is a 58 y.o. female who is referred for services by psychiatrist Dr. Modesta Messing due to patient experiencing symptoms of depression. She denies any psychiatric hospitalizations. She participated in outpatient therapy at [facility] for several years. She reports grief and loss regarding having her cat euthanized on 12/08/2018 due to lung problems.  Patient had her cat for almost 12 years.  She also reports worry about her mother who is in transition regarding end-of-life as mother has dementia and kidney failure.  Patient worries about how she will cope with the loss of her mother when it happens.  Patient reports crying spells, deep sadness, worry, and anxiety.  Patient last was seen via virtual visit about 2 weeks ago.  She reports coping better with the loss of her cat and says the pain is not as bad as it was.  She reports increased concerns about her mother as mother now has liver failure per patient's report.  She says mother has been to the hospital 3-4 times since last session.  At one point, nursing home staff advised patient to notify family members as mother's liver enzyme levels were extremely elevated.   Patient reports being very emotionally upset at that time as she thought mother was dying.  However, mother's condition did not worsen but patient still anticipates receiving a call any day indicating mother has passed.  She expresses increased acceptance of the eventuality of her mother's death and states knowing she is in transition.  She reports having recent telephone conversation with mother to inform her that she and her siblings would be all right when it is time for her to leave.  Patient reports this brought her some sense of peace by being able to talk with her mother about this.  She still worries about possible relapse whenher mother dies.   Suicidal/Homicidal: Nowithout intent/plan  Therapist Response: reviewed symptoms, praised and reinforced patient's increased emotional awareness and efforts in approaching rather than avoiding her feelings, facilitated expression of thoughts and feelings about mother's condition, validated and normalized feelings, praised and reinforced patient's participation in making choices consistent with her values regarding her contact/conversation with her mother, discussed effects on her mood and behavior, discussed patient's spiritual beliefs about death/afterlife, discussed ways for patient to use her spirituality to cope   Plan: Return again in 2 weeks.  Diagnosis: Axis I: MDD, Recurrent, Moderate    Axis II: No diagnosis    Alonza Smoker, LCSW 04/05/2019
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
## 2019-04-12 ENCOUNTER — Other Ambulatory Visit: Payer: Self-pay

## 2019-04-12 ENCOUNTER — Ambulatory Visit (INDEPENDENT_AMBULATORY_CARE_PROVIDER_SITE_OTHER): Payer: Medicaid Other | Admitting: Psychiatry

## 2019-04-12 ENCOUNTER — Encounter (HOSPITAL_COMMUNITY): Payer: Self-pay | Admitting: Psychiatry

## 2019-04-12 DIAGNOSIS — F330 Major depressive disorder, recurrent, mild: Secondary | ICD-10-CM

## 2019-04-12 MED ORDER — CLONAZEPAM 1 MG PO TABS: 1.0000 mg | ORAL_TABLET | Freq: Two times a day (BID) | ORAL | 2 refills | Status: DC

## 2019-04-12 MED ORDER — BUPROPION HCL ER (XL) 300 MG PO TB24: 300.0000 mg | ORAL_TABLET | Freq: Every day | ORAL | 2 refills | Status: DC

## 2019-04-12 MED ORDER — BUPROPION HCL ER (XL) 150 MG PO TB24: ORAL_TABLET | ORAL | 2 refills | Status: DC

## 2019-04-12 NOTE — Addendum Note (Signed)
Addended by: Norman Clay on: 04/12/2019 01:45 PM   Modules accepted: Orders

## 2019-04-12 NOTE — Patient Instructions (Signed)
1. Continue duloxetine 60 mg daily 2.Continuebupropion450 mg daily 3. Continueclonazepam 1 mg twice a day as needed for anxiety  4.Next appointment: in 2 months 5. Check TSH

## 2019-04-18 ENCOUNTER — Telehealth (HOSPITAL_COMMUNITY): Payer: Self-pay | Admitting: *Deleted

## 2019-04-18 ENCOUNTER — Other Ambulatory Visit (HOSPITAL_COMMUNITY): Payer: Self-pay | Admitting: Psychiatry

## 2019-04-18 MED ORDER — CLONAZEPAM 1 MG PO TABS: ORAL_TABLET | ORAL | 1 refills | Status: DC

## 2019-04-18 NOTE — Telephone Encounter (Signed)
PATIENT CALLED LVM STATING THAT SHE HAS FOLLOWED UP WITH PCP & [facility] HAS HAD A THYROID TEST TEST DONE RECENTLY . TEST RESULTS 3.7 NORMAL

## 2019-04-18 NOTE — Telephone Encounter (Signed)
-   Please adviser her that clonazepam can be increased up to 2.5 mg per day (1 mg twice a day and she can take additional 0.5 mg as needed). I ordered new prescription to the pharmacy. Advise her to have sooner follow up in December as needed - Please contact the pharmacy to cancel previous order of clonazepam 1 mg twice a day.

## 2019-04-18 NOTE — Telephone Encounter (Signed)
PER PROVIDER: Please adviser her that clonazepam can be increased up to 2.5 mg per day (1 mg twice a day and she can take additional 0.5 mg as needed). I ordered new prescription to the pharmacy. Advise her to have sooner follow up in December as needed   PER PROVIDER ALSO: - Please contact the pharmacy to cancel previous order of clonazepam 1 mg twice a day. RX STATED TO EARLY FOR REFILL WILL RUN TO SEE IF INSURANCE WILL COVER REFILL FOR  SCRIPT   10 DAYS EARLY

## 2019-04-18 NOTE — Telephone Encounter (Signed)
PATIENT HAS CALLED HER MOM HAS BEEN PLACE ON COMFORT CARE @ HOSPICE & SHE REQUESTED IF SHE CAN HAVE HER KLONOPIN INCREASED 1 TAB  3 X/DAILY?? # [id number]. NEXT APPT IS 06/06/2019

## 2019-04-19 ENCOUNTER — Ambulatory Visit (INDEPENDENT_AMBULATORY_CARE_PROVIDER_SITE_OTHER): Payer: Medicaid Other | Admitting: Psychiatry

## 2019-04-19 ENCOUNTER — Other Ambulatory Visit: Payer: Self-pay

## 2019-04-19 DIAGNOSIS — F330 Major depressive disorder, recurrent, mild: Secondary | ICD-10-CM

## 2019-04-19 NOTE — Progress Notes (Signed)
Virtual Visit via Video Note  I connected with Rebecca Barrera on 04/19/19 at 2:15 PM EST by a video enabled telemedicine application and verified that I am speaking with the correct person using two identifiers.   I discussed the limitations of evaluation and management by telemedicine and the availability of in person appointments. The patient expressed understanding and agreed to proceed.  I provided 45 minutes of non-face-to-face time during this encounter.   Alonza Smoker, LCSW  THERAPIST PROGRESS NOTE  Session Time: Tuesday 04/19/2019 2:15 PM - 3:00 PM  Participation Level: Active  Behavioral Response: CasualAlert/less depressed  Type of Therapy: Individual Therapy  Treatment Goals addressed: begin healthy grieving process  Interventions: Supportive, CBT  Summary: Rebecca Barrera is a 58 y.o. female who is referred for services by psychiatrist Dr. Modesta Messing due to patient experiencing symptoms of depression. She denies any psychiatric hospitalizations. She participated in outpatient therapy at [facility] for several years. She reports grief and loss regarding having her cat euthanized on 12/08/2018 due to lung problems.  Patient had her cat for almost 12 years.  She also reports worry about her mother who is in transition regarding end-of-life as mother has dementia and kidney failure.  Patient worries about how she will cope with the loss of her mother when it happens.  Patient reports crying spells, deep sadness, worry, and anxiety.  Patient last was seen via virtual visit about 2 weeks ago.  She reports continuing to coping better with the loss of her cat and says the pain is not as bad as it was.  However, she has experienced increased sadness and crying spells as she recently was informed doctors have done all they can do regarding her mother's care.  Per her report, mother is no longer eating or drinking.  She anticipates she will be moved to comfort care very soon.  She has talked with her  brother who is very supportive.  They have already made preneed arrangements for mother.  Patient is very tearful in session today and expresses hurt and sadness about her mother's condition.  She continues to express some fear about how she will react when her mother dies but is hopeful she will be able to use the skills she is used in coping with the loss of her cat.  She reports she is not trying to avoid her feelings but is trying to approach her feelings.  Suicidal/Homicidal: Nowithout intent/plan  Therapist Response: reviewed symptoms, praised and reinforced patient's increased emotional awareness and efforts in approaching rather than avoiding her feelings, facilitated expression of thoughts and feelings about mother's condition, validated and normalized feelings regarding grief and loss, assisted patient identify support system and ways to use, assisted patient identify her strengths including spirituality and ways to use to cope, assisted patient develop coping statements using her spirituality Plan: Return again in 1-2 weeks  Diagnosis: Axis I: MDD, Recurrent, Moderate    Axis II: No diagnosis    Alonza Smoker, LCSW 04/19/2019

## 2019-05-03 ENCOUNTER — Other Ambulatory Visit: Payer: Self-pay

## 2019-05-03 ENCOUNTER — Ambulatory Visit (INDEPENDENT_AMBULATORY_CARE_PROVIDER_SITE_OTHER): Payer: Medicaid Other | Admitting: Psychiatry

## 2019-05-03 DIAGNOSIS — F330 Major depressive disorder, recurrent, mild: Secondary | ICD-10-CM | POA: Diagnosis not present

## 2019-05-03 NOTE — Progress Notes (Signed)
Virtual Visit via Video Note  I connected with Rebecca Barrera on 05/03/19 at 4:10 PM EST by a video enabled telemedicine application and verified that I am speaking with the correct person using two identifiers.   I discussed the limitations of evaluation and management by telemedicine and the availability of in person appointments. The patient expressed understanding and agreed to proceed.  I provided 45 minutes of non-face-to-face time during this encounter.   Alonza Smoker, LCSW  THERAPIST PROGRESS NOTE  Session Time: Tuesday 121/2020 4:10 PM - 4:55 PM   Participation Level: Active  Behavioral Response: CasualAlert/less depressed  Type of Therapy: Individual Therapy  Treatment Goals addressed: begin healthy grieving process  Interventions: Supportive, CBT  Summary: Rebecca Barrera is a 58 y.o. female who is referred for services by psychiatrist Dr. Modesta Messing due to patient experiencing symptoms of depression. She denies any psychiatric hospitalizations. She participated in outpatient therapy at [facility] for several years. She reports grief and loss regarding having her cat euthanized on 12/08/2018 due to lung problems.  Patient had her cat for almost 12 years.  She also reports worry about her mother who is in transition regarding end-of-life as mother has dementia and kidney failure.  Patient worries about how she will cope with the loss of her mother when it happens.  Patient reports crying spells, deep sadness, worry, and anxiety.  Patient last was seen via virtual visit about 2 weeks ago.  She reports continued sadness and crying spells.  Her mother has been moved to comfort care.  She says her mother is sleeping more and no longer calls patient. She states knowing her mother is in transition.  This has triggered increased grief and loss issues regarding her cat.  She worries about coping with the upcoming Christmas holiday.  She is still trying to maintain positive self-care and reports  continued involvement in performing various household tasks.  She also continues to enjoy taking care of her cat.  She also continues to talk regularly with her brother.   Suicidal/Homicidal: Nowithout intent/plan  Therapist Response: reviewed symptoms, praised and reinforced patient's positive self-care and involvement in activities, discussed ways to maintain involvement in activities, facilitated expression of thoughts and feelings about her relationship with her mother, validated and normalized feelings regarding grief and loss including those related to the loss of her cat being triggered by mother's condition, assisted patient identify ways to use her faith and spirituality to develop coping statements   Plan: Return again in 1-2 weeks  Diagnosis: Axis I: MDD, Recurrent, Moderate    Axis II: No diagnosis    Alonza Smoker, LCSW 05/03/2019

## 2019-05-10 ENCOUNTER — Ambulatory Visit (INDEPENDENT_AMBULATORY_CARE_PROVIDER_SITE_OTHER): Payer: Medicaid Other | Admitting: Psychiatry

## 2019-05-10 ENCOUNTER — Other Ambulatory Visit: Payer: Self-pay

## 2019-05-10 DIAGNOSIS — F330 Major depressive disorder, recurrent, mild: Secondary | ICD-10-CM | POA: Diagnosis not present

## 2019-05-10 NOTE — Progress Notes (Signed)
Virtual Visit via Video Note  I connected with Rebecca Barrera on 05/10/19 at  4:00 PM EST by a video enabled telemedicine application and verified that I am speaking with the correct person using two identifiers.   I discussed the limitations of evaluation and management by telemedicine and the availability of in person appointments. The patient expressed understanding and agreed to proceed.    I provided 45 minutes of non-face-to-face time during this encounter.   Alonza Smoker, LCSW  THERAPIST PROGRESS NOTE  Session Time: Tuesday 05/10/2019 4:00 PM - 4:45 PM   Participation Level: Active  Behavioral Response: CasualAlert/less depressed  Type of Therapy: Individual Therapy  Treatment Goals addressed: begin healthy grieving process  Interventions: Supportive, CBT  Summary: Rebecca Barrera is a 58 y.o. female who is referred for services by psychiatrist Dr. Modesta Messing due to patient experiencing symptoms of depression. She denies any psychiatric hospitalizations. She participated in outpatient therapy at [facility] for several years. She reports grief and loss regarding having her cat euthanized on 12/08/2018 due to lung problems.  Patient had her cat for almost 12 years.  She also reports worry about her mother who is in transition regarding end-of-life as mother has dementia and kidney failure.  Patient worries about how she will cope with the loss of her mother when it happens.  Patient reports crying spells, deep sadness, worry, and anxiety.  Patient last was seen via virtual visit about 1 week ago.  She reports continued sadness about mother's condition and increased thoughts about her deceased cat.  However, she reports decreased crying spells. She expresses increased acceptance of mother's condition and has been using spirituality to develop coping statements.  She continues to talk with mother periodically via video.  She maintains involvement in activity and continues to talk regularly with her  brother.   She reports she is starting to get in the mood for Christmas and recently decorated her home.  She also said her mother a tabletop Christmas tree to decorate her room.  Patient reports this made her feel much better.  She also continues to enjoy playing with her cat.   Suicidal/Homicidal: Nowithout intent/plan  Therapist Response: reviewed symptoms, praised and reinforced patient's positive self-care and involvement in activities validated and normalized feelings regarding grief and loss including those related to the loss of her cat being triggered by mother's condition, continued to discuss patient's memories of previous experiences with her mother, discussed acceptance and also pursuing activities consistent with patient values in going through this transition with her mother  Plan: Return again in 1-2 weeks  Diagnosis: Axis I: MDD, Recurrent, Moderate    Axis II: No diagnosis    Alonza Smoker, LCSW 05/10/2019

## 2019-05-17 ENCOUNTER — Ambulatory Visit (INDEPENDENT_AMBULATORY_CARE_PROVIDER_SITE_OTHER): Payer: Medicaid Other | Admitting: Psychiatry

## 2019-05-17 ENCOUNTER — Other Ambulatory Visit: Payer: Self-pay

## 2019-05-17 DIAGNOSIS — F330 Major depressive disorder, recurrent, mild: Secondary | ICD-10-CM | POA: Diagnosis not present

## 2019-05-17 NOTE — Progress Notes (Signed)
Virtual Visit via Telephone Note  I connected with Rebecca Barrera on 05/17/19 at 1:15 PM  by telephone and verified that I am speaking with the correct person using two identifiers.   I discussed the limitations, risks, security and privacy concerns of performing an evaluation and management service by telephone and the availability of in person appointments. I also discussed with the patient that there may be a patient responsible charge related to this service. The patient expressed understanding and agreed to proceed.  I provided 45 minutes of non-face-to-face time during this encounter.   Alonza Smoker, LCSW     THERAPIST PROGRESS NOTE  Session Time: Tuesday 05/17/2019 1:15 PM -  2:00 PM   Participation Level: Active  Behavioral Response: CasualAlert/less depressed  Type of Therapy: Individual Therapy  Treatment Goals addressed: begin healthy grieving process  Interventions: Supportive, CBT  Summary: Rebecca Barrera is a 58 y.o. female who is referred for services by psychiatrist Dr. Modesta Messing due to patient experiencing symptoms of depression. She denies any psychiatric hospitalizations. She participated in outpatient therapy at [facility] for several years. She reports grief and loss regarding having her cat euthanized on 12/08/2018 due to lung problems.  Patient had her cat for almost 12 years.  She also reports worry about her mother who is in transition regarding end-of-life as mother has dementia and kidney failure.  Patient worries about how she will cope with the loss of her mother when it happens.  Patient reports crying spells, deep sadness, worry, and anxiety.  Patient last was seen via virtual visit about 1 week ago.  She reports continued sadness about mother's condition and increased thoughts about her deceased cat. She also reports increased crying spells. She expresses increased acceptance of mother's condition and continues to use spirituality to develop coping statements.  She  also has been listening to inspirational music and watching Christmas movies. She continues to talk with mother periodically via video.  She maintains involvement in activity and continues to talk regularly with her brother. She also reports recently reconnecting with a friend who also lost her pet. She talks to her son and her neighbor for support as well. She reports still enjoying playing with her cat and says she has added more decorations to her home. However, she reports she isn't using any decorations she used with other cat. She reports trying to start new traditions.     Suicidal/Homicidal: Nowithout intent/plan  Therapist Response: reviewed symptoms, praised and reinforced patient's positive self-care and involvement in activities validated and normalized feelings regarding grief and loss, discussed integrated grief, discussed ways to cope with grief during the holidays,     Plan: Return again in 2 weeks. Patient will call if earlier appointment is needed.   Diagnosis: Axis I: MDD, Recurrent, Moderate    Axis II: No diagnosis    Alonza Smoker, LCSW 05/17/2019

## 2019-05-24 ENCOUNTER — Ambulatory Visit (HOSPITAL_COMMUNITY): Payer: Medicaid Other | Admitting: Psychiatry

## 2019-05-31 ENCOUNTER — Ambulatory Visit (INDEPENDENT_AMBULATORY_CARE_PROVIDER_SITE_OTHER): Payer: Medicaid Other | Admitting: Psychiatry

## 2019-05-31 ENCOUNTER — Other Ambulatory Visit: Payer: Self-pay

## 2019-05-31 DIAGNOSIS — F330 Major depressive disorder, recurrent, mild: Secondary | ICD-10-CM

## 2019-05-31 NOTE — Progress Notes (Signed)
Virtual Visit via Video Note  I connected with Rebecca Barrera on 05/31/19 at 3:10 PM EST by a video enabled telemedicine application and verified that I am speaking with the correct person using two identifiers.   I discussed the limitations of evaluation and management by telemedicine and the availability of in person appointments. The patient expressed understanding and agreed to proceed.  I provided 43 minutes of non-face-to-face time during this encounter.   Alonza Smoker, LCSW     THERAPIST PROGRESS NOTE  Session Time: Tuesday 05/31/2019 3:10 PM - 3:53 PM   Participation Level: Active  Behavioral Response: CasualAlert/less depressed  Type of Therapy: Individual Therapy  Treatment Goals addressed: begin healthy grieving process  Interventions: Supportive, CBT  Summary: Rebecca Barrera is a 58 y.o. female who is referred for services by psychiatrist Dr. Modesta Messing due to patient experiencing symptoms of depression. She denies any psychiatric hospitalizations. She participated in outpatient therapy at [facility] for several years. She reports grief and loss regarding having her cat euthanized on 12/08/2018 due to lung problems.  Patient had her cat for almost 12 years.  She also reports worry about her mother who is in transition regarding end-of-life as mother has dementia and kidney failure.  Patient worries about how she will cope with the loss of her mother when it happens.  Patient reports crying spells, deep sadness, worry, and anxiety.  Patient last was seen via virtual visit about 2 weeks ago.  She reports managing Christmas better than she thought she would.  She reports thoughts of her deceased pet as well as concerns about her mother's declining health but not dwelling on these thoughts.  She acknowledged her painful feelings on Christmas but also engaged in celebrating the holiday and enjoying spending time with her new cat.  She continues to fear receiving the phone call that her  mother's condition has worsened and fears how she will react.  Suicidal/Homicidal: Nowithout intent/plan  Therapist Response: reviewed symptoms, praised and reinforced patient's efforts and coping with the Christmas holiday, assisted patient examine her thought patterns about coping with adversity, discussed her tendency to underestimate her ability to cope with adversity, assisted patient identify her successes in coping with adversity as well as strengths used, discussed ways to maintain positive self-care and socialization, also discussed ways to be more engaged in daily life and assisted patient identify hobbies to pursue (doing puzzles, coloring), developed plan with patient to follow through on strategies discussed in session  Plan: Return again in 2 weeks. Patient will call if earlier appointment is needed.   Diagnosis: Axis I: MDD, Recurrent, Moderate    Axis II: No diagnosis    Alonza Smoker, LCSW 05/31/2019

## 2019-06-06 ENCOUNTER — Telehealth (HOSPITAL_COMMUNITY): Payer: Self-pay | Admitting: Psychiatry

## 2019-06-06 ENCOUNTER — Telehealth: Payer: Self-pay

## 2019-06-06 ENCOUNTER — Telehealth: Payer: Self-pay | Admitting: Psychiatry

## 2019-06-06 ENCOUNTER — Ambulatory Visit (HOSPITAL_COMMUNITY): Payer: Medicaid Other | Admitting: Psychiatry

## 2019-06-06 DIAGNOSIS — Z634 Disappearance and death of family member: Secondary | ICD-10-CM

## 2019-06-06 DIAGNOSIS — F4321 Adjustment disorder with depressed mood: Secondary | ICD-10-CM | POA: Insufficient documentation

## 2019-06-06 MED ORDER — HYDROXYZINE HCL 25 MG PO TABS: 12.5000 mg | ORAL_TABLET | Freq: Two times a day (BID) | ORAL | 1 refills | Status: DC | PRN

## 2019-06-06 NOTE — Telephone Encounter (Signed)
Attempted to call patient left voicemail.

## 2019-06-06 NOTE — Telephone Encounter (Signed)
Spoke to Ms. Manson Passey our referral coordinator who will schedule patient with our provider at the Saturday clinic.  Ms. Manson Passey will call patient to set it up.

## 2019-06-06 NOTE — Telephone Encounter (Signed)
done

## 2019-06-06 NOTE — Telephone Encounter (Signed)
Returned call to patient again.   Patient reports she just lost her mother.  Patient was very tearful during our conversation today.  Patient reports she misses her mother and does not know how to cope with it.  She reports she wants to talk to her mother ,she was very close to her and spoke to her every day several times a day.  Patient reports she does have a brother as well as a Veterinary surgeon who has been supportive.  She also reports she has upcoming appointment with Dr. Evelene Croon.  Discussed several medication change options with patient today.  She reports she does not want to increase her Cymbalta since the last time it was increased it gave her headaches. Patient reports she would rather go on hydroxyzine as needed for breakthrough anxiety.  She already takes Klonopin 2.5 mg daily.  Discussed possibly adding Seroquel as needed if she is having a lot of anxiety and mood symptoms.  Patient however declines.  Patient requested if her provider could possibly see her sooner.  In the meantime she will work with her counselor.  We will sent hydroxyzine 12.5 to 25 mg p.o. twice daily as needed for breakthrough anxiety-15-day supply to pharmacy today.  Advised patient to call writer back if she has trouble coping or if the hydroxyzine does not help.  Also discussed with patient that writer will send a message to Dr. Evelene Croon as well as CMA to see if patient can be scheduled for a sooner appointment.

## 2019-06-06 NOTE — Telephone Encounter (Signed)
pt called crying she stated that her mom died this morning and she stated that she having a hard ime. can you send her something for a couple day to help her

## 2019-06-07 ENCOUNTER — Ambulatory Visit (INDEPENDENT_AMBULATORY_CARE_PROVIDER_SITE_OTHER): Payer: Medicaid Other | Admitting: Psychiatry

## 2019-06-07 ENCOUNTER — Other Ambulatory Visit: Payer: Self-pay

## 2019-06-07 DIAGNOSIS — F330 Major depressive disorder, recurrent, mild: Secondary | ICD-10-CM | POA: Diagnosis not present

## 2019-06-07 DIAGNOSIS — Z634 Disappearance and death of family member: Secondary | ICD-10-CM | POA: Diagnosis not present

## 2019-06-07 NOTE — Progress Notes (Signed)
Virtual Visit via Video Note  I connected with [name] on 06/07/19 at 11:15 AM EST by a video enabled telemedicine application and verified that I am speaking with the correct person using two identifiers.   I discussed the limitations of evaluation and management by telemedicine and the availability of in person appointments. The patient expressed understanding and agreed to proceed.  I provided 35 minutes of non-face-to-face time during this encounter.   [name], LCSW    THERAPIST PROGRESS NOTE  Session Time: Tuesday 06/07/2019 11:15 AM - 11:50 AM   Participation Level: Active  Behavioral Response: CasualAlert/depressed/tearful  Type of Therapy: Individual Therapy  Treatment Goals addressed: begin healthy grieving process  Interventions: Supportive, CBT  Summary: Rebecca Barrera is a 59 y.o. female who is referred for services by psychiatrist Dr. Vanetta Shawl due to patient experiencing symptoms of depression. She denies any psychiatric hospitalizations. She participated in outpatient therapy at [facility] for several years. She reports grief and loss regarding having her cat euthanized on 12/08/2018 due to lung problems.  Patient had her cat for almost 12 years.  She also reports worry about her mother who is in transition regarding end-of-life as mother has dementia and kidney failure.  Patient worries about how she will cope with the loss of her mother when it happens.  Patient reports crying spells, deep sadness, worry, and anxiety.  Patient last was seen via virtual visit about a week ago.  She is seen emergently today as her mother died yesterday.  Patient is very tearful and expresses sadness.  She reports strong support from her brother and says they have already taking care of most of the funeral arrangements.  She reports planning to talk with funeral director today and anticipates service being held the beginning of next week.  She reports loss of appetite.  She states knowing she  will be able to accept her mother's death in time but experiencing much hurt and pain.    Suicidal/Homicidal: Nowithout intent/plan  Therapist Response: reviewed symptoms, facilitated patient sharing narrative of mother's death, discussed rituals in which she plans to participate, facilitated expression of thoughts and feelings, validated and normalized feelings of grief and loss, discussed ways to help patient improve self-care to cope with stress and grief, also discussed ways to use support system, discussed use of spirituality to develop coping statements,  therapist will send patient grief coping cards  Plan: Return again in 1 week   Diagnosis: Axis I: MDD, Recurrent, Moderate    Axis II: No diagnosis    [name], LCSW 06/07/2019

## 2019-06-08 ENCOUNTER — Other Ambulatory Visit: Payer: Self-pay

## 2019-06-08 ENCOUNTER — Ambulatory Visit (INDEPENDENT_AMBULATORY_CARE_PROVIDER_SITE_OTHER): Payer: Medicaid Other | Admitting: Psychiatry

## 2019-06-08 ENCOUNTER — Ambulatory Visit (HOSPITAL_COMMUNITY): Payer: Medicaid Other | Admitting: Psychiatry

## 2019-06-08 ENCOUNTER — Encounter: Payer: Self-pay | Admitting: Psychiatry

## 2019-06-08 DIAGNOSIS — F331 Major depressive disorder, recurrent, moderate: Secondary | ICD-10-CM

## 2019-06-08 DIAGNOSIS — F411 Generalized anxiety disorder: Secondary | ICD-10-CM

## 2019-06-08 DIAGNOSIS — F4321 Adjustment disorder with depressed mood: Secondary | ICD-10-CM | POA: Diagnosis not present

## 2019-06-08 MED ORDER — CLONAZEPAM 1 MG PO TABS: 1.0000 mg | ORAL_TABLET | Freq: Three times a day (TID) | ORAL | 1 refills | Status: DC

## 2019-06-08 MED ORDER — BUPROPION HCL ER (XL) 300 MG PO TB24: 300.0000 mg | ORAL_TABLET | Freq: Every day | ORAL | 1 refills | Status: DC

## 2019-06-08 MED ORDER — BUPROPION HCL ER (XL) 150 MG PO TB24: ORAL_TABLET | ORAL | 1 refills | Status: DC

## 2019-06-08 NOTE — Progress Notes (Signed)
[facility] MD OP Progress Note  I connected with  Rebecca Barrera on 06/08/19 by a video enabled telemedicine application and verified that I am speaking with the correct person using two identifiers.   I discussed the limitations of evaluation and management by telemedicine. The patient expressed understanding and agreed to proceed.    06/08/2019 9:44 AM Rebecca Barrera  MRN:  [id number]  Chief Complaint:  " I am trying to hang in there. Can you please increase my clonazepam to 1 mg three times a day just for a short while."  HPI: Patient has a history of depression and anxiety.  She recently lost her beloved mother and has been great distress due to that.  She stated that this morning she saw an email about her mother's orbituary and that made her feel very sad and anxious.  She stated that she has a lot going on.  She informed that she has been taking clonazepam 1 mg in the morning, 1 mg in the afternoon and 0.5 mg at bedtime.  She stated that without this medication she would have been able to manage.  She requested that her dose is temporarily increased to 1 mg 3 times a day that she wants to get through this phase as smoothly as possible.  She does have support from her brother.  She has been talking to therapist Ms. [name] regularly.  She stated that she did not have as to fall back on she would be in very very bad shape. She denied any suicidal ideations or intent to hurt herself. She also requests refills for Wellbutrin.  She informed that her Cymbalta prescription is sent by her other provider. She has spoken to Dr. Shea Evans on January 4 and was offered hydroxyzine as needed in addition to her current regimen.  Today patient informed that she cannot take hydroxyzine as it causes her to have headaches.  She also informed that higher dose of Cymbalta in the past also caused her to have headaches.  Visit Diagnosis:    ICD-10-CM   1. MDD (major depressive disorder), recurrent episode, moderate (HCC)  F33.1    2. Grief  F43.21     Past Psychiatric History: depression, anxiety   Past Medical History:  Past Medical History:  Diagnosis Date  . Anemia   . Anxiety   . Depression   . Hiatal hernia   . High cholesterol   . Leg pain   . Migraine   . Migraines   . Panic attacks   . Varicose veins     Past Surgical History:  Procedure Laterality Date  . COLONOSCOPY  2008   Dr. Laural Golden  . ESOPHAGOGASTRODUODENOSCOPY  2008   Dr. Rehman--> superficial ulceration at the GE junction a large hiatal hernia with dependent segment on the left side with food debris and coffee-ground material, swollen and erythematous folds, few antral erosions.  . ESOPHAGOGASTRODUODENOSCOPY  02/08/2008   Normal esophageal mucosa aside from Schatzki's ring not manipulated (the patient not dysphagic) Large diaphragmatic and likely paraesophageal hernia, gastric      mucosa appeared normal, otherwise pylorus patent, normal D1 and D2.  . HERNIA REPAIR    . HIATAL HERNIA REPAIR  06/2008   paraesophageal hernia repair  . TUBAL LIGATION      Family Psychiatric History: see below  Family History:  Family History  Problem Relation Age of Onset  . Hyperlipidemia Mother   . Hypertension Mother   . Stroke Mother   . Dementia Mother   .  Migraines Mother   . Anxiety disorder Mother   . Depression Mother   . Hyperlipidemia Son   . Hypertension Son   . Heart disease Father   . Heart disease Sister   . Alcohol abuse Sister   . Epilepsy Brother   . Heart disease Brother   . Colon cancer Neg Hx     Social History:  Social History   Socioeconomic History  . Marital status: Divorced    Spouse name: Not on file  . Number of children: 3  . Years of education: Not on file  . Highest education level: Not on file  Occupational History  . Occupation: Disabled  Tobacco Use  . Smoking status: Former Smoker    Years: 5.00    Types: Cigarettes    Quit date: 12/27/2011    Years since quitting: 7.4  . Smokeless  tobacco: Never Used  Substance and Sexual Activity  . Alcohol use: No    Comment: hx of alcohol abuse/dependence - last used 7 years ago  . Drug use: No  . Sexual activity: Not Currently    Birth control/protection: Surgical    Comment: tubal  Other Topics Concern  . Not on file  Social History Narrative  . Not on file   Social Determinants of Health   Financial Resource Strain:   . Difficulty of Paying Living Expenses: Not on file  Food Insecurity:   . Worried About Programme researcher, broadcasting/film/video in the Last Year: Not on file  . Ran Out of Food in the Last Year: Not on file  Transportation Needs:   . Lack of Transportation (Medical): Not on file  . Lack of Transportation (Non-Medical): Not on file  Physical Activity:   . Days of Exercise per Week: Not on file  . Minutes of Exercise per Session: Not on file  Stress:   . Feeling of Stress : Not on file  Social Connections:   . Frequency of Communication with Friends and Family: Not on file  . Frequency of Social Gatherings with Friends and Family: Not on file  . Attends Religious Services: Not on file  . Active Member of Clubs or Organizations: Not on file  . Attends Banker Meetings: Not on file  . Marital Status: Not on file    Allergies:  Allergies  Allergen Reactions  . Aspirin Other (See Comments)    Due to hernia  . Hydrocodone-Acetaminophen Itching  . Lorazepam Other (See Comments)    migranes  . Vicodin [Hydrocodone-Acetaminophen]     Pt states it gives her a headache    Metabolic Disorder Labs: No results found for: HGBA1C, MPG No results found for: PROLACTIN Lab Results  Component Value Date   CHOL 182 11/24/2013   TRIG 72 11/24/2013   HDL 58 11/24/2013   CHOLHDL 3.1 11/24/2013   VLDL 14 11/24/2013   LDLCALC 110 (H) 11/24/2013   Lab Results  Component Value Date   TSH 1.114 11/24/2013   TSH 1.14 01/03/2011    Therapeutic Level Labs: No results found for: LITHIUM No results found for:  VALPROATE No components found for:  CBMZ  Current Medications: Current Outpatient Medications  Medication Sig Dispense Refill  . buPROPion (WELLBUTRIN XL) 150 MG 24 hr tablet Take total of 450 mg daily (300 mg + 150 mg) 30 tablet 2  . buPROPion (WELLBUTRIN XL) 300 MG 24 hr tablet Take 1 tablet (300 mg total) by mouth daily. 30 tablet 2  . clonazePAM (KLONOPIN)  1 MG tablet Take 1 tablet (1 mg total) by mouth 2 (two) times daily. 60 tablet 2  . clonazePAM (KLONOPIN) 1 MG tablet 0.5-1 mg three times a day, not to exceed 2.5 mg per day 75 tablet 1  . DULoxetine (CYMBALTA) 60 MG capsule Take 60 mg by mouth daily.    . hydrOXYzine (ATARAX/VISTARIL) 25 MG tablet Take 0.5-1 tablets (12.5-25 mg total) by mouth 2 (two) times daily as needed. Severe anxiety attacks - breakthrough anxiety 30 tablet 1  . Multiple Vitamins-Minerals (CENTRUM SILVER 50+WOMEN PO) Take by mouth daily.    . Naproxen Sodium (ALEVE PO) Take by mouth.    . pravastatin (PRAVACHOL) 20 MG tablet Take 20 mg by mouth daily.    . promethazine (PHENERGAN) 25 MG tablet Take 1 tablet (25 mg total) by mouth every 6 (six) hours as needed for nausea. 30 tablet 0  . SUMAtriptan-naproxen (TREXIMET) 85-500 MG tablet Take 1 tablet by mouth every 2 (two) hours as needed.      No current facility-administered medications for this visit.     Musculoskeletal: Strength & Muscle Tone: unable to assess due to telemed visit Gait & Station: unable to assess due to telemed visit Patient leans: unable to assess due to telemed visit  Psychiatric Specialty Exam: Review of Systems  There were no vitals taken for this visit.There is no height or weight on file to calculate BMI.  General Appearance: Fairly Groomed  Eye Contact:  Fair  Speech:  Clear and Coherent and Normal Rate  Volume:  Normal  Mood:  Depressed and Dysphoric  Affect:  Depressed and Tearful  Thought Process:  Goal Directed, Linear and Descriptions of Associations: Intact   Orientation:  Full (Time, Place, and Person)  Thought Content: Logical   Suicidal Thoughts:  No  Homicidal Thoughts:  No  Memory:  Recent;   Good Remote;   Good  Judgement:  Fair  Insight:  Fair  Psychomotor Activity:  Normal  Concentration:  Concentration: Fair and Attention Span: Fair  Recall:  Fair  Fund of Knowledge: Good  Language: Good  Akathisia:  Negative  Handed:  Right  AIMS (if indicated): not done  Assets:  Communication Skills Desire for Improvement Financial Resources/Insurance Housing Social Support  ADL's:  Intact  Cognition: WNL  Sleep:  Fair   Screenings: PHQ2-9     Office Visit from 07/29/2016 in [facility]  PHQ-2 Total Score  0       Assessment and Plan: Patient is in distress due to recent loss of her mother.  Patient stated that she really would benefit by a higher dose of clonazepam for the next few weeks temporarily.  She stated that she plans to calm down on the dose after a few weeks of mourning.  He is currently taking clonazepam 1 mg in the morning, 1 mg in afternoon and 0.5 mg at bedtime.  After discussion with the patient will increase the dose of clonazepam to 1 mg 3 times daily temporarily. She will continue to see Ms. [name] for supportive therapy and counseling.  We will have her see Dr. Vanetta Shawl in early March after she comes back.  Patient was informed to contact the office if she wants to be seen by me again.  1. MDD (major depressive disorder), recurrent episode, moderate (HCC)  -Continue buPROPion (WELLBUTRIN XL) 300 MG 24 hr tablet; Take 1 tablet (300 mg total) by mouth daily.  Dispense: 30 tablet; Refill: 1 -Continue buPROPion (WELLBUTRIN XL) 150  MG 24 hr tablet; Take total of 450 mg daily (300 mg + 150 mg)  Dispense: 30 tablet; Refill: 1  2. Grief -Continue therapy with Ms. [name].  3. Anxiety state  -Increase clonazePAM (KLONOPIN) 1 MG tablet; Take 1 tablet (1 mg total) by mouth 3 (three) times daily.  Dispense: 90 tablet;  Refill: 1   She is requesting an appointment with therapist Ms. [name] for January 13-14 as her mother's funeral is scheduled for January 12. Follow-up with Dr. Vanetta Shawl in 2 months.  Patient was encouraged to call the office if she wants to be seen by me again.   Zena Amos, MD 06/08/2019, 9:44 AM

## 2019-06-16 ENCOUNTER — Other Ambulatory Visit: Payer: Self-pay

## 2019-06-16 ENCOUNTER — Ambulatory Visit (INDEPENDENT_AMBULATORY_CARE_PROVIDER_SITE_OTHER): Payer: Medicaid Other | Admitting: Psychiatry

## 2019-06-16 DIAGNOSIS — F331 Major depressive disorder, recurrent, moderate: Secondary | ICD-10-CM | POA: Diagnosis not present

## 2019-06-16 NOTE — Progress Notes (Signed)
Virtual Visit via Video Note  I connected with Rebecca Barrera on 06/16/19 at 11:00 AM EST by a video enabled telemedicine application and verified that I am speaking with the correct person using two identifiers.   I discussed the limitations of evaluation and management by telemedicine and the availability of in person appointments. The patient expressed understanding and agreed to proceed.  I provided 50 minutes of non-face-to-face time during this encounter.   [name], LCSW   THERAPIST PROGRESS NOTE  Session Time: Thursday 06/16/2019 11:00 AM - 11:50 AM   Participation Level: Active  Behavioral Response: CasualAlert/depressed/tearful  Type of Therapy: Individual Therapy  Treatment Goals addressed: begin healthy grieving process  Interventions: Supportive, CBT  Summary: Rebecca Barrera is a 59 y.o. female who is referred for services by psychiatrist Dr. Vanetta Shawl due to patient experiencing symptoms of depression. She denies any psychiatric hospitalizations. She participated in outpatient therapy at [facility] for several years. She reports grief and loss regarding having her cat euthanized on 12/08/2018 due to lung problems.  Patient had her cat for almost 12 years.  She also reports worry about her mother who is in transition regarding end-of-life as mother has dementia and kidney failure.  Patient worries about how she will cope with the loss of her mother when it happens.  Patient reports crying spells, deep sadness, worry, and anxiety.  Patient last was seen via virtual visit about a week ago.  Per her report, services for her mother were held this past Tuesday.  She reports attending the services were very difficult and stressful but having strong support from her brother.  She reports experiencing crying spells.  She is beginning to resume normal involvement in activities.  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  She continues to have strong support from her brother and her  children.  She is able to verbalize positive memories of her mother in session today and smiles as she recalls some of her mother's sayings.    Suicidal/Homicidal: Nowithout intent/plan  Therapist Response: reviewed symptoms, facilitated patient sharing mourning rituals in which she and her family participated, facilitated expression of thoughts and feelings, validated and normalized feelings of grief and loss, discussed her support system and ways to use as well as strengthen relationships with her support system, discussed ways to improve daily structure and self-care to cope with stress and grief, develop plan with patient to read 1 grief coping card daily  Plan: Return again in 1 week   Diagnosis: Axis I: MDD, Recurrent, Moderate    Axis II: No diagnosis    [name], LCSW 06/16/2019

## 2019-06-17 ENCOUNTER — Ambulatory Visit: Payer: Medicaid Other | Admitting: Psychiatry

## 2019-06-23 ENCOUNTER — Ambulatory Visit (INDEPENDENT_AMBULATORY_CARE_PROVIDER_SITE_OTHER): Payer: Medicaid Other | Admitting: Psychiatry

## 2019-06-23 ENCOUNTER — Other Ambulatory Visit: Payer: Self-pay

## 2019-06-23 DIAGNOSIS — F331 Major depressive disorder, recurrent, moderate: Secondary | ICD-10-CM | POA: Diagnosis not present

## 2019-06-23 DIAGNOSIS — F4321 Adjustment disorder with depressed mood: Secondary | ICD-10-CM | POA: Diagnosis not present

## 2019-06-23 NOTE — Progress Notes (Signed)
Virtual Visit via Video  I connected with Rebecca Barrera on 06/23/19 at 2:20 PM EST by a video enabled telemedicine application and verified that I am speaking with the correct person using two identifiers.   I discussed the limitations of evaluation and management by telemedicine and the availability of in person appointments. The patient expressed understanding and agreed to proceed.  I provided 35 minutes of non-face-to-face time during this encounter.   [name], LCSW  THERAPIST PROGRESS NOTE  Session Time: Thursday 2:20 PM - 2:55 PM   Participation Level: Active  Behavioral Response: CasualAlert/depressed/tearful  Type of Therapy: Individual Therapy  Treatment Goals addressed: begin healthy grieving process  Interventions: Supportive, CBT  Summary: Rebecca Barrera is a 59 y.o. female who is referred for services by psychiatrist Dr. Vanetta Shawl due to patient experiencing symptoms of depression. She denies any psychiatric hospitalizations. She participated in outpatient therapy at [facility] for several years. She reports grief and loss regarding having her cat euthanized on 12/08/2018 due to lung problems.  Patient had her cat for almost 12 years.  She also reports worry about her mother who is in transition regarding end-of-life as mother has dementia and kidney failure.  Patient worries about how she will cope with the loss of her mother when it happens.  Patient reports crying spells, deep sadness, worry, and anxiety.  Patient last was seen via virtual visit about a week ago.  She reports coping better with grief and loss issues regarding her mother.  She reports decreased frequency/intensity of crying spells and increased involvement in activity.  She maintains daily positive self-care and performs household tasks.  She has initiated social contacts with her family and neighbors.  She reports enjoying playing with her cat.  She also reports increased use of spirituality through prayer.  She  states now having peace about her mother's death.  She states coping better than she thought she would.    Suicidal/Homicidal: Nowithout intent/plan  Therapist Response: reviewed symptoms, praised and reinforced patient's increased behavioral activation/increased social interaction, facilitated patient expressing thoughts and feelings, validated and normalized feelings related to grief,   discussed the stages of grief and experiences patient is having with various stages  Plan: Return again in 2 weeks   Diagnosis: Axis I: MDD, Recurrent, Moderate    Axis II: No diagnosis    [name], LCSW 06/23/2019

## 2019-06-29 ENCOUNTER — Ambulatory Visit (INDEPENDENT_AMBULATORY_CARE_PROVIDER_SITE_OTHER): Payer: Medicaid Other | Admitting: Psychiatry

## 2019-06-29 ENCOUNTER — Other Ambulatory Visit: Payer: Self-pay

## 2019-06-29 DIAGNOSIS — F331 Major depressive disorder, recurrent, moderate: Secondary | ICD-10-CM

## 2019-06-29 NOTE — Progress Notes (Signed)
Virtual Visit via Video Note  I connected with Rebecca Barrera on 06/29/19 at 11:10 AM EST by a video enabled telemedicine application and verified that I am speaking with the correct person using two identifiers.   I discussed the limitations of evaluation and management by telemedicine and the availability of in person appointments. The patient expressed understanding and agreed to proceed.    I provided 40 minutes of non-face-to-face time during this encounter.   [name], LCSW   THERAPIST PROGRESS NOTE  Session Time: Wednesday 06/29/2019 11:10 AM -  11:50 AM   Participation Level: Active  Behavioral Response: CasualAlert/depressed/tea  Type of Therapy: Individual Therapy  Treatment Goals addressed: begin healthy grieving process  Interventions: Supportive, CBT  Summary: Rebecca Barrera is a 59 y.o. female who is referred for services by psychiatrist Dr. Vanetta Shawl due to patient experiencing symptoms of depression. She denies any psychiatric hospitalizations. She participated in outpatient therapy at [facility] for several years. She reports grief and loss regarding having her cat euthanized on 12/08/2018 due to lung problems.  Patient had her cat for almost 12 years.  She also reports worry about her mother who is in transition regarding end-of-life as mother has dementia and kidney failure.  Patient worries about how she will cope with the loss of her mother when it happens.  Patient reports crying spells, deep sadness, worry, and anxiety.  Patient last was seen via virtual visit about a week ago.  She is being seen emergently today as she reports increased sadness and crying spells.  This appears to have been triggered by her brother informing her he was not in a position to talk with anyone when she sent him a text yesterday.  Patient interpreted this as brother not being there for her.  She continues to have a tendency to underestimate her ability to cope with adversity.  She reports  eventually realizing her brother is experiencing grief related to the loss of their mother in his own way.  She reports coping better today.  She has maintained involvement in activities and maintain social contact with her children and her neighbors.    Suicidal/Homicidal: Nowithout intent/plan  Therapist Response: reviewed symptoms, discussed stressors, facilitated expression of thoughts and feelings, validated feelings, assisted patient examine her expectations of her interaction with her brother, assisted patient identify realistic expectations, also assisted patient examine her pattern of dependence and attachment to others, discussed healthy versus unhealthy attachments, assisted patient identify/challenge/and replace negative thought patterns about her ability to cope with adversity with more helpful thoughts, assisted patient identify coping skills she successfully has used and ways to apply in coping with current grief and loss issues, discussed grief process including integrative grief, therapist will mail patient another set of grief coping cards regarding healing  Plan: Return again in 2 weeks   Diagnosis: Axis I: MDD, Recurrent, Moderate    Axis II: No diagnosis    [name], LCSW 06/29/2019

## 2019-07-14 ENCOUNTER — Ambulatory Visit (INDEPENDENT_AMBULATORY_CARE_PROVIDER_SITE_OTHER): Payer: Medicaid Other | Admitting: Psychiatry

## 2019-07-14 ENCOUNTER — Other Ambulatory Visit: Payer: Self-pay

## 2019-07-14 DIAGNOSIS — F331 Major depressive disorder, recurrent, moderate: Secondary | ICD-10-CM | POA: Diagnosis not present

## 2019-07-14 NOTE — Progress Notes (Signed)
Virtual Visit via Video Note  I connected with Rebecca Barrera on 07/14/19 at 3;15 PM ESTby a video enabled telemedicine application and verified that I am speaking with the correct person using two identifiers.   I discussed the limitations of evaluation and management by telemedicine and the availability of in person appointments. The patient expressed understanding and agreed to proceed.   I provided 45 minutes of non-face-to-face time during this encounter.   [name], LCSW  THERAPIST PROGRESS NOTE  Session Time: Thursday 07/14/2019 3:15 PM - 4:00 PM    Participation Level: Active  Behavioral Response: CasualAlert/depressed/tea  Type of Therapy: Individual Therapy  Treatment Goals addressed: begin healthy grieving process  Interventions: Supportive, CBT  Summary: Rebecca Barrera is a 59 y.o. female who is referred for services by psychiatrist Dr. Vanetta Shawl due to patient experiencing symptoms of depression. She denies any psychiatric hospitalizations. She participated in outpatient therapy at [facility] for several years. She reports grief and loss regarding having her cat euthanized on 12/08/2018 due to lung problems.  Patient had her cat for almost 12 years.  She also reports worry about her mother who is in transition regarding end-of-life as mother has dementia and kidney failure.  Patient worries about how she will cope with the loss of her mother when it happens.  Patient reports crying spells, deep sadness, worry, and anxiety.  Patient last was seen via virtual visit about 2 weeks ago.  She reports continued sadness regarding her deceased mother but reports decreased intensity and frequency of emotional pain and crying spells.  She reports been able to acknowledge her feelings, allowing herself to grieve, and then continuing with her day.  She maintains involvement in activities such as playing with her cat and performing household responsibilities.  She also has been able to take care  of financial and business matters.  She reports increased contact with her neighbors and continued contact with her children as well as her brother.  She also has been looking for a grief support group online and enrolled in the group.  The start date was canceled and patient is waiting for it to be rescheduled.  She reports increased interest and self-care and self nurture.  She is considering getting a new hairstyle and has been researching styles online.  Patient reports that she has been using the second set of grief coping cards and says this has been helpful.  She reports sometimes still feeling as though she is in denial about mother's death.  Suicidal/Homicidal: Nowithout intent/plan  Therapist Response: reviewed symptoms, praised and reinforced patient's continued behavioral activation and socialization, discussed effects,continued to process grief and loss issues and discussed the stages of grief, discussed stages not being a linear process, assisted patient examine dependency issues in the relationship with her mother and how they are affecting her now, discussed ways patient can increase her independence, encouraged patient to follow through with support group as well as improved self-care and self nurture, assigned patient to continue using grief coping cards regarding healing  Plan: Return again in 2 weeks   Diagnosis: Axis I: MDD, Recurrent, Moderate    Axis II: No diagnosis    [name], LCSW 07/14/2019
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
## 2019-07-25 ENCOUNTER — Encounter (HOSPITAL_COMMUNITY): Payer: Self-pay | Admitting: Psychiatry

## 2019-07-25 ENCOUNTER — Other Ambulatory Visit: Payer: Self-pay

## 2019-07-25 ENCOUNTER — Ambulatory Visit (INDEPENDENT_AMBULATORY_CARE_PROVIDER_SITE_OTHER): Payer: Medicaid Other | Admitting: Psychiatry

## 2019-07-25 DIAGNOSIS — F331 Major depressive disorder, recurrent, moderate: Secondary | ICD-10-CM

## 2019-07-25 DIAGNOSIS — F411 Generalized anxiety disorder: Secondary | ICD-10-CM | POA: Diagnosis not present

## 2019-07-25 MED ORDER — BUPROPION HCL ER (XL) 300 MG PO TB24: 300.0000 mg | ORAL_TABLET | Freq: Every day | ORAL | 1 refills | Status: DC

## 2019-07-25 MED ORDER — CLONAZEPAM 1 MG PO TABS: 1.0000 mg | ORAL_TABLET | Freq: Three times a day (TID) | ORAL | 1 refills | Status: DC

## 2019-07-25 MED ORDER — BUPROPION HCL ER (XL) 150 MG PO TB24: ORAL_TABLET | ORAL | 1 refills | Status: DC

## 2019-07-25 NOTE — Patient Instructions (Addendum)
1. Continue duloxetine 60 mg daily 2.Continuebupropion450 mg daily 3. Continueclonazepam 1 mg twice a day as needed for anxiety  4.Next appointment: 4/8 at 1 PM  5. Consider group therapy for grief [facility] Contact person  Alto Denver, Leader/coordinator [phone]

## 2019-08-08 ENCOUNTER — Telehealth (HOSPITAL_COMMUNITY): Payer: Self-pay | Admitting: Psychiatry

## 2019-08-08 ENCOUNTER — Ambulatory Visit (INDEPENDENT_AMBULATORY_CARE_PROVIDER_SITE_OTHER): Payer: Medicaid Other | Admitting: Psychiatry

## 2019-08-08 ENCOUNTER — Other Ambulatory Visit: Payer: Self-pay

## 2019-08-08 ENCOUNTER — Encounter (HOSPITAL_COMMUNITY): Payer: Self-pay | Admitting: Psychiatry

## 2019-08-08 DIAGNOSIS — F331 Major depressive disorder, recurrent, moderate: Secondary | ICD-10-CM

## 2019-08-08 NOTE — Progress Notes (Signed)
Virtual Visit via Video Note  I connected with Rebecca Barrera on 08/08/19 at 1:10 PM EST by a video enabled telemedicine application and verified that I am speaking with the correct person using two identifiers.   I discussed the limitations of evaluation and management by telemedicine and the availability of in person appointments. The patient expressed understanding and agreed to proceed.   I provided 47 minutes of non-face-to-face time during this encounter.   [name], LCSW \\THERAPIST  PROGRESS NOTE  Session Time: Monday 08/08/2019 1:10 PM - 1:57 PM   Participation Level: Active  Behavioral Response: CasualAlert/depressed/tea  Type of Therapy: Individual Therapy  Treatment Goals addressed: begin healthy grieving process  Interventions: Supportive, CBT  Summary: Rebecca Barrera is a 59 y.o. female who is referred for services by psychiatrist Dr. Vanetta Shawl due to patient experiencing symptoms of depression. She denies any psychiatric hospitalizations. She participated in outpatient therapy at [facility] for several years. She reports grief and loss regarding having her cat euthanized on 12/08/2018 due to lung problems.  Patient had her cat for almost 12 years.  She also reports worry about her mother who is in transition regarding end-of-life as mother has dementia and kidney failure.  Patient worries about how she will cope with the loss of her mother when it happens.  Patient reports crying spells, deep sadness, worry, and anxiety.  Patient last was seen via virtual visit about 3-4 weeks ago.  Patient continues to experience moderate symptoms of depression including depressed mood, frequent crying spells, and decreased motivation.  She continues to have unresolved grief and loss issues regarding the death of her mother.  Patient reports contacting a local online grief support group and has attended 1 session.  She reports being nervous as it was a group but plans to continue to attend group.  She  has maintained involvement in daily activities including playing with her cat and performing household responsibilities.  She maintains contact with her neighbors, her children, and her brother.  She expresses frustration with self she continues to experience emotional pain and is not as far along in the grief process as she thinks she should be.  She does express desire to try to do something in memory of her mother to help give self some peace.   Suicidal/Homicidal: Nowithout intent/plan  Therapist Response: reviewed symptoms, praised and reinforced patient's continued behavioral activation and socialization, discussed effects, discussed patient's attendance to grief support group and encouraged patient to continue to attend, encouraged patient to have patience with self and normalized moving back and forth through stages of grief, explored with patient ways to honor her mother to maintain healthy connection with mother and reinvest in life, developed plan with patient to donate positive books and coloring books to the patient's in the nursing home where her mother resided, assisted patient identify steps to implement plan. Plan: Return again in 2 weeks   Diagnosis: Axis I: MDD, Recurrent, Moderate    Axis II: No diagnosis    [name], LCSW 08/08/2019

## 2019-08-11 ENCOUNTER — Other Ambulatory Visit (HOSPITAL_COMMUNITY): Payer: Self-pay | Admitting: Nurse Practitioner

## 2019-08-11 ENCOUNTER — Other Ambulatory Visit: Payer: Self-pay

## 2019-08-11 ENCOUNTER — Ambulatory Visit (HOSPITAL_COMMUNITY)
Admission: RE | Admit: 2019-08-11 | Discharge: 2019-08-11 | Disposition: A | Discharge status: Home / Self Care | Payer: Medicaid Other | Source: Ambulatory Visit | Attending: Nurse Practitioner | Admitting: Nurse Practitioner

## 2019-08-11 DIAGNOSIS — M25522 Pain in left elbow: Secondary | ICD-10-CM | POA: Diagnosis present

## 2019-08-12 ENCOUNTER — Other Ambulatory Visit (HOSPITAL_COMMUNITY): Payer: Self-pay | Admitting: Nurse Practitioner

## 2019-08-12 DIAGNOSIS — Z1231 Encounter for screening mammogram for malignant neoplasm of breast: Secondary | ICD-10-CM

## 2019-08-17 ENCOUNTER — Ambulatory Visit (HOSPITAL_COMMUNITY): Payer: Medicaid Other

## 2019-08-19 ENCOUNTER — Ambulatory Visit (HOSPITAL_COMMUNITY): Payer: Medicaid Other

## 2019-08-22 ENCOUNTER — Ambulatory Visit (HOSPITAL_COMMUNITY): Payer: Medicaid Other | Admitting: Psychiatry

## 2019-08-31 ENCOUNTER — Other Ambulatory Visit: Payer: Self-pay

## 2019-08-31 ENCOUNTER — Ambulatory Visit (INDEPENDENT_AMBULATORY_CARE_PROVIDER_SITE_OTHER): Payer: Medicaid Other | Admitting: Psychiatry

## 2019-08-31 DIAGNOSIS — F331 Major depressive disorder, recurrent, moderate: Secondary | ICD-10-CM | POA: Diagnosis not present

## 2019-08-31 NOTE — Progress Notes (Signed)
Virtual Visit via Telephone Note  I connected with Rebecca Barrera on 08/31/19 at 2:20 PM EDT by telephone and verified that I am speaking with the correct person using two identifiers.   I discussed the limitations, risks, security and privacy concerns of performing an evaluation and management service by telephone and the availability of in person appointments. I also discussed with the patient that there may be a patient responsible charge related to this service. The patient expressed understanding and agreed to proceed.  I provided 30 minutes of non-face-to-face time during this encounter.   [name], LCSW   \THERAPIST PROGRESS NOTE  Session Time: Monday 08/31/2019 2:20 PM -  2:55 PM  Participation Level: Active  Behavioral Response: CasualAlert/depressed/tea  Type of Therapy: Individual Therapy  Treatment Goals addressed: begin healthy grieving process  Interventions: Supportive, CBT  Summary: Rebecca Barrera is a 59 y.o. female who is referred for services by psychiatrist Dr. Vanetta Shawl due to patient experiencing symptoms of depression. She denies any psychiatric hospitalizations. She participated in outpatient therapy at [facility] for several years. She reports grief and loss regarding having her cat euthanized on 12/08/2018 due to lung problems.  Patient had her cat for almost 12 years.  She also reports worry about her mother who is in transition regarding end-of-life as mother has dementia and kidney failure.  Patient worries about how she will cope with the loss of her mother when it happens.  Patient reports crying spells, deep sadness, worry, and anxiety.  Patient last was seen via virtual visit about 3-4 weeks ago.  Patient is experiencing decreased symptoms of depression. She reports decreased crying spells and increased involvement in activities. She has been attending a virtual support group and has had one on one contact with one of the members. She reports this has been helpful.  She also plans to start attending a support group through [facility]. She reports increased acceptance of mother's death and is making efforts to reinvest in life. She has being doing household tasks and now is redecorating her apartment. She also still plans to do something for residents at a nursing home in mother's memory.  She maintains contact with family members.  Suicidal/Homicidal: Nowithout intent/plan  Therapist Response: reviewed symptoms, praised and reinforced patient's continued behavioral activation and socialization, discussed effects, discussed patient's attendance to grief support group and encouraged patient to continue to attend, reviewed stages of grief and assisted patient identify her current stage, reviewed grief process particularly integrated grief, discussed lapse versus relapse of depression, will send patient handout on early warning signs of depression in preparation for next session,   Plan: Return again in 2 weeks   Diagnosis: Axis I: MDD, Recurrent, Moderate    Axis II: No diagnosis    [name], LCSW 08/31/2019

## 2019-09-05 ENCOUNTER — Ambulatory Visit (HOSPITAL_COMMUNITY): Payer: Medicaid Other | Admitting: Psychiatry
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
## 2019-09-08 ENCOUNTER — Other Ambulatory Visit: Payer: Self-pay

## 2019-09-08 ENCOUNTER — Encounter (HOSPITAL_COMMUNITY): Payer: Self-pay | Admitting: Psychiatry

## 2019-09-08 ENCOUNTER — Ambulatory Visit (INDEPENDENT_AMBULATORY_CARE_PROVIDER_SITE_OTHER): Payer: Medicaid Other | Admitting: Psychiatry

## 2019-09-08 DIAGNOSIS — F411 Generalized anxiety disorder: Secondary | ICD-10-CM | POA: Diagnosis not present

## 2019-09-08 DIAGNOSIS — F331 Major depressive disorder, recurrent, moderate: Secondary | ICD-10-CM

## 2019-09-08 MED ORDER — BUPROPION HCL ER (XL) 300 MG PO TB24: 300.0000 mg | ORAL_TABLET | Freq: Every day | ORAL | 2 refills | Status: DC

## 2019-09-08 MED ORDER — CLONAZEPAM 1 MG PO TABS: 1.0000 mg | ORAL_TABLET | Freq: Three times a day (TID) | ORAL | 1 refills | Status: DC

## 2019-09-08 MED ORDER — BUPROPION HCL ER (XL) 150 MG PO TB24: ORAL_TABLET | ORAL | 2 refills | Status: DC

## 2019-09-08 NOTE — Patient Instructions (Signed)
1. Continue duloxetine 60 mg daily 2.Continuebupropion450 mg daily 3.Continueclonazepam 1 mg three times a day as needed for anxiety  4.Next appointment:6/21 at 2:30

## 2019-10-12 ENCOUNTER — Other Ambulatory Visit: Payer: Self-pay

## 2019-10-12 ENCOUNTER — Ambulatory Visit (INDEPENDENT_AMBULATORY_CARE_PROVIDER_SITE_OTHER): Payer: Medicaid Other | Admitting: Psychiatry

## 2019-10-12 DIAGNOSIS — F331 Major depressive disorder, recurrent, moderate: Secondary | ICD-10-CM | POA: Diagnosis not present

## 2019-10-12 NOTE — Progress Notes (Signed)
Virtual Visit via Video Note  I connected with Rebecca Barrera on 10/12/19 at 2:05 PM EDT by a video enabled telemedicine application and verified that I am speaking with the correct person using two identifiers.   I discussed the limitations of evaluation and management by telemedicine and the availability of in person appointments. The patient expressed understanding and agreed to proceed.  I provided 50  minutes of non-face-to-face time during this encounter.   Rebecca Salvage, LCSW  \THERAPIST PROGRESS NOTE  Session Time: Wednesday 10/12/2019 2:05 PM - 2:55 PM   Participation Level: Active  Behavioral Response: CasualAlert/depressed/tea  Type of Therapy: Individual Therapy  Treatment Goals addressed: begin healthy grieving process  Interventions: Supportive, CBT  Summary: Rebecca Barrera is a 59 y.o. female who is referred for services by psychiatrist Dr. Vanetta Shawl due to patient experiencing symptoms of depression. She denies any psychiatric hospitalizations. She participated in outpatient therapy at [facility] for several years. She reports grief and loss regarding having her cat euthanized on 12/08/2018 due to lung problems.  Patient had her cat for almost 12 years.  She also reports worry about her mother who is in transition regarding end-of-life as mother has dementia and kidney failure.  Patient worries about how she will cope with the loss of her mother when it happens.  Patient reports crying spells, deep sadness, worry, and anxiety.  Patient last was seen via virtual visit about 2 months  ago.  Patient is experiencing minimal to moderate symptoms of depression. She reports decreased crying spells, improved concentration, and increased motivation.  She also reports continued involvement in activities as well as regular contact with family members.  She  is seeing a Orthoptist through a local funeral home for spiritual counseling.  She reports becoming more sad on Mother's Day as it triggered  memories of her mother.  She reports coping by lighting a candle and writing a letter to her mother.  She reports feeling as though a burden have been lifted after reading the letter.  She reports later talking to her children on that day as well as shopping online for a gift for self.  She reports reinvesting in life more.  Is able to talk about mother without being overwhelmed and no longer does has thoughts and memories of death and pain related to her mother but now verbalizes positive memories about mother as well as positive thoughts expressing increased acceptance of mother's death.  Suicidal/Homicidal: Nowithout intent/plan  Therapist Response: reviewed symptoms, praised and reinforced patient's continued behavioral activation and socialization, encouraged  patient to continue work with chaplain and to follow through with her plans to join virtual group, praised and reinforced patient's use of healthy coping techniques to cope with Mother's Day, assisted patient identify and verbalize feelings of regret and guilt, assisted patient identify and replace thoughts evoking inappropriate guilt with more helpful thoughts, discussed stepdown plan and began to process patient's feelings about termination, will discuss early warning signs of depression next session   Plan: Return again in 2 weeks   Diagnosis: Axis I: MDD, Recurrent, Moderate    Axis II: No diagnosis    Rebecca Salvage, LCSW 10/12/2019

## 2019-11-02 ENCOUNTER — Ambulatory Visit (INDEPENDENT_AMBULATORY_CARE_PROVIDER_SITE_OTHER): Payer: Medicaid Other | Admitting: Psychiatry

## 2019-11-02 ENCOUNTER — Other Ambulatory Visit: Payer: Self-pay

## 2019-11-02 DIAGNOSIS — F331 Major depressive disorder, recurrent, moderate: Secondary | ICD-10-CM

## 2019-11-02 NOTE — Progress Notes (Signed)
Virtual Visit via Video Note  I connected with Rebecca Barrera on 11/02/19 at 2:05 PM EDT by a video enabled telemedicine application and verified that I am speaking with the correct person using two identifiers.   I discussed the limitations of evaluation and management by telemedicine and the availability of in person appointments. The patient expressed understanding and agreed to proceed.   I provided 45 minutes of non-face-to-face time during this encounter.   [name], LCSW  \THERAPIST PROGRESS NOTE  Session Time: Wednesday 11/02/2019 2:05 PM - 2:50 PM   Participation Level: Active  Behavioral Response: CasualAlert/depressed/tea  Type of Therapy: Individual Therapy  Treatment Goals addressed: begin healthy grieving process  Interventions: Supportive, CBT  Summary: Rebecca Barrera is a 59 y.o. female who is referred for services by psychiatrist Dr. Vanetta Shawl due to patient experiencing symptoms of depression. She denies any psychiatric hospitalizations. She participated in outpatient therapy at [facility] for several years. She reports grief and loss regarding having her cat euthanized on 12/08/2018 due to lung problems.  Patient had her cat for almost 12 years.  She also reports worry about her mother who is in transition regarding end-of-life as mother has dementia and kidney failure.  Patient worries about how she will cope with the loss of her mother when it happens.  Patient reports crying spells, deep sadness, worry, and anxiety.  Patient last was seen via virtual visit about 3 weeks ago. Patient is experiencing minimal symptoms of depression. She reports continued decreased crying spells, improved concentration, and increased motivation.   She also reports continued involvement in activities as well as regular contact with family members.  She is especially pleased with more frequent contact with her daughter.  She still experiences sadness when having thoughts about mother but reports  decreased intensity.  Patient continues to work with chaplain and virtual group through a local funeral home.   Suicidal/Homicidal: Nowithout intent/plan  Therapist Response: reviewed symptoms, praised and reinforced patient's continued behavioral activation and socialization, discussed the role of  consistent behavioral activation and socialization in coping with and overcoming depression, assisted patient identify ways to maintain consistency through use of daily planning and scheduling, assigned patient to do a daily schedule, will send patient and activities list to assist her in finding various activities to include on her schedule, provided psychoeducation on lapse versus relapse of depression, assisted patient identify her early warning signs of depression and strategies to use to avoid relapse   Plan: Return again in 2 weeks   Diagnosis: Axis I: MDD, Recurrent, Moderate    Axis II: No diagnosis    [name], LCSW 11/02/2019

## 2019-11-15 NOTE — Telephone Encounter (Signed)
completed
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
## 2019-11-21 ENCOUNTER — Encounter (HOSPITAL_COMMUNITY): Payer: Self-pay | Admitting: Psychiatry

## 2019-11-21 ENCOUNTER — Other Ambulatory Visit: Payer: Self-pay

## 2019-11-21 ENCOUNTER — Telehealth (INDEPENDENT_AMBULATORY_CARE_PROVIDER_SITE_OTHER): Payer: Medicaid Other | Admitting: Psychiatry

## 2019-11-21 DIAGNOSIS — F330 Major depressive disorder, recurrent, mild: Secondary | ICD-10-CM | POA: Diagnosis not present

## 2019-11-21 DIAGNOSIS — F411 Generalized anxiety disorder: Secondary | ICD-10-CM

## 2019-11-21 MED ORDER — CLONAZEPAM 1 MG PO TABS: 1.0000 mg | ORAL_TABLET | Freq: Three times a day (TID) | ORAL | 2 refills | Status: DC

## 2019-11-21 MED ORDER — BUPROPION HCL ER (XL) 150 MG PO TB24: ORAL_TABLET | ORAL | 2 refills | Status: DC

## 2019-11-21 MED ORDER — BUPROPION HCL ER (XL) 300 MG PO TB24: 300.0000 mg | ORAL_TABLET | Freq: Every day | ORAL | 2 refills | Status: DC

## 2019-11-21 NOTE — Patient Instructions (Signed)
1. Continue duloxetine 60 mg daily 2.Continuebupropion450 mg daily 3.Continueclonazepam 1 mgthree times aday as needed for anxiety. Try reduce by 0.5 mg if able 4.Next appointment: 9/13 at 2:30

## 2019-11-23 ENCOUNTER — Telehealth (HOSPITAL_COMMUNITY): Payer: Self-pay | Admitting: Psychiatry

## 2019-11-23 ENCOUNTER — Other Ambulatory Visit: Payer: Self-pay

## 2019-11-23 ENCOUNTER — Ambulatory Visit (INDEPENDENT_AMBULATORY_CARE_PROVIDER_SITE_OTHER): Payer: Medicaid Other | Admitting: Psychiatry

## 2019-11-23 DIAGNOSIS — F330 Major depressive disorder, recurrent, mild: Secondary | ICD-10-CM | POA: Diagnosis not present

## 2019-11-23 NOTE — Progress Notes (Signed)
Virtual Visit via Video Note  I connected with Rebecca Barrera on 11/23/19 at 3:40 PM EDT by a video enabled telemedicine application and verified that I am speaking with the correct person using two identifiers.   I discussed the limitations of evaluation and management by telemedicine and the availability of in person appointments. The patient expressed understanding and agreed to proceed.   I provided 35 minutes of non-face-to-face time during this encounter.   [name], LCSW  THERAPIST PROGRESS NOTE   Location:   Patient- Home/ Provider - [facility] Outpatient [place] office   Session Time: Wednesday 11/23/2019 3:40 PM - 4:5 PM  Participation Level: Active  Behavioral Response: CasualAlert/tearful at times   Type of Therapy: Individual Therapy  Treatment Goals addressed: begin healthy grieving process  Interventions: Supportive, CBT  Summary: Rebecca Barrera is a 59 y.o. female who is referred for services by psychiatrist Dr. Vanetta Shawl due to patient experiencing symptoms of depression. She denies any psychiatric hospitalizations. She participated in outpatient therapy at [facility] for several years. She reports grief and loss regarding having her cat euthanized on 12/08/2018 due to lung problems.  Patient had her cat for almost 12 years.  She also reports worry about her mother who is in transition regarding end-of-life as mother has dementia and kidney failure.  Patient worries about how she will cope with the loss of her mother when it happens.  Patient reports crying spells, deep sadness, worry, and anxiety.  Patient last was seen via virtual visit about 3 weeks ago. Patient is experiencing increased sadness and reports increased tearfulness for the past week.  Apparently was triggered by patient's birthday as this was the first birthday she has had without her mother.  This triggered increased grief and loss issues..  Patient reports having difficulty coping with that day but reports  talking with her daughter and recalling fond memories of her mother were helpful.  She has increased the variety of activities she may perform but still lacks consistency and positive daily structure.  She reports receiving information in the mail but says she still has not done any daily planning.  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   Suicidal/Homicidal: Nowithout intent/plan  Therapist Response: reviewed symptoms, assisted patient identify triggers of increased sadness, reviewed integrative grief, normalized sadness triggered by increased memories of her mother on her birthday, reviewed integrative grief, reviewed lapse versus relapse of depression, reviewed strategies to intervene, reviewed the role of consistent behavioral activation and socialization in coping with and overcoming depression, developed plan with patient to do a daily schedule and bring to next session, therapist will send patient blank schedule to assist in daily planning   Plan: Return again in 2 weeks   Diagnosis: Axis I: MDD, Recurrent, Moderate    Axis II: No diagnosis    [name], LCSW 11/23/2019

## 2019-11-23 NOTE — Telephone Encounter (Signed)
Therapist attempted to contact patient twice via text through caregility platform for scheduled appointment, no response.  Therapist called patient and left message indicating attempt and requesting patient call office. °

## 2019-12-07 ENCOUNTER — Ambulatory Visit (INDEPENDENT_AMBULATORY_CARE_PROVIDER_SITE_OTHER): Payer: Medicaid Other | Admitting: Psychiatry

## 2019-12-07 DIAGNOSIS — F330 Major depressive disorder, recurrent, mild: Secondary | ICD-10-CM

## 2019-12-07 NOTE — Progress Notes (Signed)
Virtual Visit via Telephone Note  I connected with [name] on 12/07/19 at 2:15 EST by telephone and verified that I am speaking with the correct person using two identifiers.   I discussed the limitations, risks, security and privacy concerns of performing an evaluation and management service by telephone and the availability of in person appointments. I also discussed with the patient that there may be a patient responsible charge related to this service. The patient expressed understanding and agreed to proceed.   I provided 40 minutes of non-face-to-face time during this encounter.   [name], LCSW THERAPIST PROGRESS NOTE   Location:   Patient- Home/ Provider - [facility] Outpatient [place] office   Session Time: Wednesday 12/07/2019 3:15 PM -  3:55 PM   Participation Level: Active  Behavioral Response: CasualAlert/tearful at times   Type of Therapy: Individual Therapy  Treatment Goals addressed: begin healthy grieving process  Interventions: Supportive, CBT  Summary: Rebecca Barrera is a 59 y.o. female who is referred for services by psychiatrist Dr. Vanetta Shawl due to patient experiencing symptoms of depression. She denies any psychiatric hospitalizations. She participated in outpatient therapy at [facility] for several years. She reports grief and loss regarding having her cat euthanized on 12/08/2018 due to lung problems.  Patient had her cat for almost 12 years.  She also reports worry about her mother who is in transition regarding end-of-life as mother has dementia and kidney failure.  Patient worries about how she will cope with the loss of her mother when it happens.  Patient reports crying spells, deep sadness, worry, and anxiety.  Patient last was seen via virtual visit about 3 weeks ago. Patient is experiencing minimal symptoms of depression.  She reports much improved mood, increased involvement in activity, and increased socialization.  Patient reports increased consistency  through the use of daily planning and positive routine.  She performs various household tasks, walks outside of her apartment complex, and socializes with her neighbors.  She also reports contact with her children.  She continues to miss her mother and expresses sadness about the loss but also is able to recall fond memories.  She reports increased awareness of when something triggers grief and loss issues about her mother but reports coping with these much better.  She acknowledges her feelings but then continues with her day per her report.    Suicidal/Homicidal: Nowithout intent/plan  Therapist Response: reviewed symptoms, praised and reinforced patient's efforts to increase consistency and improve daily structure, discussed effects, discussed ways to improve and increase socialization efforts, reviewed treatment plan, discussed next steps for treatment to include 2 more sessions on learning and implementing relapse prevention strategies/maintaining positive mental health, processed patient's feelings about termination, will send patient handout on mindfulness and the window of tolerance in preparation for next session, developed plan with patient to continue using daily planning  Plan: Return again in 2 weeks   Diagnosis: Axis I: MDD, Recurrent, Moderate    Axis II: No diagnosis    [name], LCSW 12/07/2019

## 2019-12-29 ENCOUNTER — Ambulatory Visit (HOSPITAL_COMMUNITY): Payer: Medicaid Other | Admitting: Psychiatry

## 2020-01-10 ENCOUNTER — Other Ambulatory Visit: Payer: Self-pay

## 2020-01-10 ENCOUNTER — Ambulatory Visit (INDEPENDENT_AMBULATORY_CARE_PROVIDER_SITE_OTHER): Payer: Medicaid Other | Admitting: Psychiatry

## 2020-01-10 DIAGNOSIS — F330 Major depressive disorder, recurrent, mild: Secondary | ICD-10-CM | POA: Diagnosis not present

## 2020-01-10 NOTE — Progress Notes (Signed)
Virtual Visit via Video Note  I connected with Rebecca Barrera on 01/10/20 at 1:06 PM EDT  by a video enabled telemedicine application and verified that I am speaking with the correct person using two identifiers.   I discussed the limitations of evaluation and management by telemedicine and the availability of in person appointments. The patient expressed understanding and agreed to proceed.  I provided 56 minutes of non-face-to-face time during this encounter.   [name], LCSW THERAPIST PROGRESS NOTE   Location:   Patient- Home/ Provider - [facility] Outpatient [place] office   Session Time: Tuesday 01/10/2020 1:06 PM - 2:02 PM   Participation Level: Active  Behavioral Response: CasualAlert/tearful at times   Type of Therapy: Individual Therapy  Treatment Goals addressed: begin healthy grieving process  Interventions: Supportive, CBT  Summary: Rebecca Barrera is a 59 y.o. female who is referred for services by psychiatrist Dr. Vanetta Shawl due to patient experiencing symptoms of depression. She denies any psychiatric hospitalizations. She participated in outpatient therapy at [facility] for several years. She reports grief and loss regarding having her cat euthanized on 12/08/2018 due to lung problems.  Patient had her cat for almost 12 years.  She also reports worry about her mother who is in transition regarding end-of-life as mother has dementia and kidney failure.  Patient worries about how she will cope with the loss of her mother when it happens.  Patient reports crying spells, deep sadness, worry, and anxiety.  Patient last was seen via virtual visit about 4 weeks ago. Patient is experiencing minimal symptoms of depression.  She reports continued sadness about her mother but continuing to cope well.  She reports consistent involvement in activity and socialization.  She continues to perform various household tasks, walks outside of her apartment complex and socializes with her neighbors.  She  continues to have contact with her children.  She reports increased contact with a friend and reports she has been using Facebook.  She also has been doing research on art projects on [place] and plans to start doing crafts.   Suicidal/Homicidal: Nowithout intent/plan  Therapist Response: reviewed symptoms, praised and reinforced patient's efforts to increase consistency and improve daily structure, discussed effects, provided psychoeducation on mindfulness and the window of tolerance, assisted patient on rationale for using to avoid relapse of depression, assisted patient identify and practice grounding techniques, assisted patient identify and practice mindfulness activities to improve mindfulness skills, developed plan for patient to review handouts on mindfulness and the window of tolerance, also developed plan with patient to practice mindfulness activity 3-5 times per week between sessions, will send patient mental health maintenance plan in preparation for next session.   Plan: Return again in 2 weeks   Diagnosis: Axis I: MDD, Recurrent, Moderate    Axis II: No diagnosis    [name], LCSW 01/10/2020

## 2020-01-17 ENCOUNTER — Other Ambulatory Visit (HOSPITAL_COMMUNITY): Payer: Self-pay | Admitting: Nurse Practitioner

## 2020-01-17 DIAGNOSIS — Z1231 Encounter for screening mammogram for malignant neoplasm of breast: Secondary | ICD-10-CM

## 2020-01-19 ENCOUNTER — Ambulatory Visit (HOSPITAL_COMMUNITY): Payer: Medicaid Other | Admitting: Psychiatry

## 2020-01-30 ENCOUNTER — Ambulatory Visit (HOSPITAL_COMMUNITY): Payer: Self-pay

## 2020-01-31 ENCOUNTER — Ambulatory Visit (INDEPENDENT_AMBULATORY_CARE_PROVIDER_SITE_OTHER): Payer: Medicaid Other | Admitting: Psychiatry

## 2020-01-31 ENCOUNTER — Other Ambulatory Visit: Payer: Self-pay

## 2020-01-31 DIAGNOSIS — F330 Major depressive disorder, recurrent, mild: Secondary | ICD-10-CM

## 2020-01-31 NOTE — Progress Notes (Signed)
Virtual Visit via Video Note  I connected with [name] on 01/31/20 at  2:00 PM EDT by a video enabled telemedicine application and verified that I am speaking with the correct person using two identifiers.   I discussed the limitations of evaluation and management by telemedicine and the availability of in person appointments. The patient expressed understanding and agreed to proceed.  I provided 50  minutes of non-face-to-face time during this encounter.   [name], LCSW THERAPIST PROGRESS NOTE   Location:   Patient- Home/ Provider - [facility] [place] office   Session Time: Tuesday 01/31/2020 2:00 PM - 2:50 PM   Participation Level: Active  Behavioral Response: CasualAlert/tearful at times   Type of Therapy: Individual Therapy  Treatment Goals addressed: begin healthy grieving process  Interventions: Supportive, CBT  Summary: Rebecca Barrera is a 59 y.o. female who is referred for services by psychiatrist Dr. Vanetta Shawl due to patient experiencing symptoms of depression. She denies any psychiatric hospitalizations. She participated in outpatient therapy at [facility] for several years. She reports grief and loss regarding having her cat euthanized on 12/08/2018 due to lung problems.  Patient had her cat for almost 12 years.  She also reports worry about her mother who is in transition regarding end-of-life as mother has dementia and kidney failure.  Patient worries about how she will cope with the loss of her mother when it happens.  Patient reports crying spells, deep sadness, worry, and anxiety.  Patient last was seen via virtual visit about 4 weeks ago. Patient is experiencing minimal symptoms of depression but reports she was experiencing increased sadness about mother. However, she was able to identify triggers and successfully is using helpful coping strategies. She reports being more mindful of her thoughts and emotions and using grounding techniques successfully.  She  reports continued involvement in activity and socialization.  She is pleased with her recent efforts to initiate more social contact with her neighbors.  She continues to have social contact with her family as well as a friend.  She continues to perform various household tasks and walk around her apartment complex.  She still plans to start doing crafts in the near future.    Suicidal/Homicidal: Nowithout intent/plan  Therapist Response: reviewed symptoms, reviewed lapse versus relapse , praised and reinforced patient's use of helpful coping strategies, discussed effects, assisted patient develop mental health maintenance plan, did termination, encouraged patient to contact this practice should she need psychotherapy services in the future, patient will continue to see psychiatrist Dr. Vanetta Shawl for medication management   Plan: Return again in 2 weeks   Diagnosis: Axis I: MDD, Recurrent, Moderate    Axis II: No diagnosis    [name], LCSW 01/31/2020    Outpatient Therapist Discharge Summary  Rebecca Barrera    Jan 23, 1961   Admission Date: 12/27/2018  Discharge Date:  01/31/2020 Reason for Discharge:  Treatment completed Diagnosis:  Axis I:  MDD (major depressive disorder), recurrent episode, mild (HCC)    Comments: Patient will continue to see psychiatrist Dr. Vanetta Shawl for medication management.  She is encouraged to contact this practice should she need psychotherapy services in the future.  Kristofer Schaffert E Khalia Gong LCSW
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
## 2020-02-13 ENCOUNTER — Other Ambulatory Visit: Payer: Self-pay

## 2020-02-13 ENCOUNTER — Encounter (HOSPITAL_COMMUNITY): Payer: Self-pay | Admitting: Psychiatry

## 2020-02-13 ENCOUNTER — Telehealth (INDEPENDENT_AMBULATORY_CARE_PROVIDER_SITE_OTHER): Payer: Medicaid Other | Admitting: Psychiatry

## 2020-02-13 DIAGNOSIS — F330 Major depressive disorder, recurrent, mild: Secondary | ICD-10-CM | POA: Diagnosis not present

## 2020-02-13 DIAGNOSIS — F411 Generalized anxiety disorder: Secondary | ICD-10-CM

## 2020-02-13 MED ORDER — CLONAZEPAM 1 MG PO TABS: 1.0000 mg | ORAL_TABLET | Freq: Three times a day (TID) | ORAL | 2 refills | Status: DC

## 2020-02-13 MED ORDER — BUPROPION HCL ER (XL) 150 MG PO TB24: ORAL_TABLET | ORAL | 2 refills | Status: DC

## 2020-02-13 MED ORDER — BUPROPION HCL ER (XL) 300 MG PO TB24: 300.0000 mg | ORAL_TABLET | Freq: Every day | ORAL | 2 refills | Status: DC

## 2020-02-28 ENCOUNTER — Other Ambulatory Visit (HOSPITAL_COMMUNITY): Payer: Self-pay | Admitting: Internal Medicine

## 2020-02-28 DIAGNOSIS — Z1231 Encounter for screening mammogram for malignant neoplasm of breast: Secondary | ICD-10-CM

## 2020-03-19 ENCOUNTER — Ambulatory Visit (INDEPENDENT_AMBULATORY_CARE_PROVIDER_SITE_OTHER): Payer: Medicaid Other | Admitting: Psychiatry

## 2020-03-19 ENCOUNTER — Other Ambulatory Visit: Payer: Self-pay

## 2020-03-19 DIAGNOSIS — F331 Major depressive disorder, recurrent, moderate: Secondary | ICD-10-CM | POA: Diagnosis not present

## 2020-03-19 NOTE — Progress Notes (Signed)
Virtual Visit via Video Note  I connected with Rebecca Barrera on 03/19/20 at 10:31 AM  EDT by a video enabled telemedicine application and verified that I am speaking with the correct person using two identifiers.   I discussed the limitations of evaluation and management by telemedicine and the availability of in person appointments. The patient expressed understanding and agreed to proceed.  I provided 23 minutes of non-face-to-face time during this encounter.   [name], LCSW   THERAPIST PROGRESS NOTE   Location:   Patient- Home/ Provider - [facility] [place] office   Session Time: Monday 03/19/2020 10:32 AM  - 10:55 AM   Participation Level: Active  Behavioral Response: CasualAlert/tearful/distraught  Type of Therapy: Individual Therapy  Treatment Goals addressed: begin healthy grieving process  Interventions: Supportive, CBT  Summary: Rebecca Barrera is a 59 y.o. female who is referred for services by psychiatrist Dr. Vanetta Shawl due to patient experiencing symptoms of depression. She denies any psychiatric hospitalizations. She participated in outpatient therapy at [facility] for several years. She reports grief and loss regarding having her cat euthanized on 12/08/2018 due to lung problems. She then lost her mother who died in 06/21/2019.  Patient last was seen via virtual visit in August 2021. She is resuming services today due to increased stress and sadness triggered by the death of her 22 year old son this past weekend.  Per patient's report, her son died by suicide via a gunshot to the head.  She reports being very close to her son and is very distraught by this.  In recent weeks, his behavior hadchanged.  Per her report, his wife contacted her for help and patient advised wife to get mental health services for son as soon as possible.  Per patient's report she also helped to facilitate an appointment at the local mental health department that her son was supposed to attend  today. This has triggered increased grief and loss issues regarding patient's mother as well. She reports support from her brother and his wife.  She reports little contact from her 2 remaining children.    Suicidal/Homicidal: Nowithout intent/plan  Therapist Response: Reviewed symptoms, facilitated patient sharing narrative of son's death, facilitated patient expressing thoughts and feelings, validated and normalized feelings related to grief and loss, discussed using support system and her spirituality as coping tools, also facilitated referral to CMA and psychiatrist Dr. Vanetta Shawl for medication management,  Plan: Return again in 2 weeks   Diagnosis: Axis I: MDD, Recurrent, Moderate    Axis II: No diagnosis    [name], LCSW 03/19/2020

## 2020-03-21 ENCOUNTER — Telehealth (HOSPITAL_COMMUNITY): Payer: Self-pay | Admitting: *Deleted

## 2020-03-21 NOTE — Telephone Encounter (Signed)
Patient called and spoke with Rebecca Barrera yesterday about her son committing suicide this gone Monday. Per pt he mom just past away this January as well. Per pt and [name] that patient need to talk with Provider so that she can discuss whats been happening. Per [name] she don't know if patient need med change. Patient want provider to please call her at [phone]. Staff tried to put patient on schedule for a sooner appt but provider do not have any available time slot.

## 2020-03-21 NOTE — Telephone Encounter (Signed)
noted 

## 2020-03-21 NOTE — Telephone Encounter (Signed)
Discussed with the patient. She agrees to have follow up on 10/29 at 8:40. She denies SI, HI. She verbalized understanding about emergency resources if needed.
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
## 2020-03-29 ENCOUNTER — Ambulatory Visit (HOSPITAL_COMMUNITY): Payer: Medicaid Other

## 2020-03-30 ENCOUNTER — Other Ambulatory Visit: Payer: Self-pay

## 2020-03-30 ENCOUNTER — Encounter (HOSPITAL_COMMUNITY): Payer: Self-pay | Admitting: Psychiatry

## 2020-03-30 ENCOUNTER — Telehealth (INDEPENDENT_AMBULATORY_CARE_PROVIDER_SITE_OTHER): Payer: Medicaid Other | Admitting: Psychiatry

## 2020-03-30 DIAGNOSIS — F331 Major depressive disorder, recurrent, moderate: Secondary | ICD-10-CM | POA: Diagnosis not present

## 2020-03-30 DIAGNOSIS — F411 Generalized anxiety disorder: Secondary | ICD-10-CM

## 2020-03-30 NOTE — Patient Instructions (Signed)
1. Continue duloxetine 60 mg daily 2.Continuebupropion450 mg daily 3.Continueclonazepam 1 mgthree times aday as needed for anxiety  4.Next appointment: 12/3 at 10 AM f

## 2020-04-05 ENCOUNTER — Ambulatory Visit (INDEPENDENT_AMBULATORY_CARE_PROVIDER_SITE_OTHER): Payer: Medicaid Other | Admitting: Psychiatry

## 2020-04-05 ENCOUNTER — Other Ambulatory Visit: Payer: Self-pay

## 2020-04-05 DIAGNOSIS — F331 Major depressive disorder, recurrent, moderate: Secondary | ICD-10-CM

## 2020-04-05 NOTE — Progress Notes (Signed)
Virtual Visit via Video Note  I connected with Rebecca Barrera on 04/05/20 at 4:10 PM EDT by a video enabled telemedicine application and verified that I am speaking with the correct person using two identifiers.  Location: Patient:  Home Provider: [facility] [place] office   I discussed the limitations of evaluation and management by telemedicine and the availability of in person appointments. The patient expressed understanding and agreed to proceed.  I provided 45 minutes of non-face-to-face time during this encounter.   [name], LCSW  THERAPIST PROGRESS NOTE   Location:   Patient- Home/ Provider - [facility] [place] office   Session Time: Thursday 04/05/2020 4:10 PM  - 4:55 PM   Participation Level: Active  Behavioral Response: CasualAlert/tearful/distraught  Type of Therapy: Individual Therapy  Treatment Goals addressed: begin healthy grieving process  Interventions: Supportive, CBT  Summary: Rebecca Barrera is a 59 y.o. female who is referred for services by psychiatrist Dr. Vanetta Shawl due to patient experiencing symptoms of depression. She denies any psychiatric hospitalizations. She participated in outpatient therapy at [facility] for several years. She reports grief and loss regarding having her cat euthanized on 12/08/2018 due to lung problems. She then lost her mother who died in 07/03/2019.  Patient last was seen via virtual visit about 2 weeks ago.  She continues to experience grief and loss issues related to the death of her 60 year old son by suicide.  She has tried to maintain positive self-care.  She reports support from her pastor and her brother.  She also talks with her son's wife.  She reports she did not attend her son's funeral but does plan to visit his grave in the near future with her brother. She continues to express confusion about son's death.  She identifies and verbalizes anger in session with her daughter-in-law because she did not inform mental  health providers about possible relevant information about patient's son's behavior.  Patient reports she is coping by trying to focus on how her son would want her to treat his wife.  She also has been reading previously mailed handouts on the stages of grief as well as using the grief deck.   Suicidal/Homicidal: Nowithout intent/plan  Therapist Response: Reviewed symptoms, validated and normalized feelings related to grief and loss, began to review the stages of death and the current stage patient is experiencing, encouraged patient to continue using support system and her spirituality as coping tools, also discussed coping statements,  Plan: Return again in 2 weeks   Diagnosis: Axis I: MDD, Recurrent, Moderate    Axis II: No diagnosis    [name], LCSW 04/05/2020

## 2020-04-10 ENCOUNTER — Telehealth: Payer: Self-pay

## 2020-04-10 ENCOUNTER — Telehealth (HOSPITAL_COMMUNITY): Payer: Self-pay | Admitting: *Deleted

## 2020-04-10 NOTE — Telephone Encounter (Signed)
pt called left message that she is not doing well with her son's death. she wanted to know if she could try zoloft.

## 2020-04-10 NOTE — Telephone Encounter (Signed)
Please inform the patient that I would not recommend her to be started on Zoloft as she is already on duloxetine, which works similar to [employer].

## 2020-04-10 NOTE — Telephone Encounter (Signed)
LMOM

## 2020-04-10 NOTE — Telephone Encounter (Signed)
Patient called and [facility] stating that she was instructed by Dr. Vanetta Shawl that if she needed something to help her with the sudden passing of her son to call the office back. Per pt, she would like for Dr. Vanetta Shawl to provider her Zoloft. Per pt she do not want any other medication then Zoloft because shes not interested in medications that will cause a lot of weight gain. Per pt she only want Zoloft and if provider could send it to her pharmacy that would be good. Patient number is [phone].

## 2020-04-10 NOTE — Telephone Encounter (Signed)
Spoke with patient and informed her with that provider stated.   Patient would like to know if provider could suggest her with couple of options that she could take for her to do her research on it to figure out if she wants to take it.

## 2020-04-10 NOTE — Telephone Encounter (Signed)
It would be olanzapine or risperidone as we discussed at her last visit. I would be happy to see her sooner if she has any more questions.

## 2020-04-11 ENCOUNTER — Telehealth (HOSPITAL_COMMUNITY): Payer: Self-pay | Admitting: *Deleted

## 2020-04-11 NOTE — Telephone Encounter (Signed)
pt called again today states she was returning a call she states she not doing well with her sons death.

## 2020-04-11 NOTE — Telephone Encounter (Signed)
Patient called stating she will go with the  olanzapine.

## 2020-04-11 NOTE — Telephone Encounter (Signed)
Patient called back stating she have decided that she is not going to take neither one and she will just get through this with her clonazePAM . Per pt she was not comfortable with antipsychotics and will just get through this to the best of her ability.

## 2020-04-12 ENCOUNTER — Ambulatory Visit (INDEPENDENT_AMBULATORY_CARE_PROVIDER_SITE_OTHER): Payer: Medicaid Other | Admitting: Psychiatry

## 2020-04-12 ENCOUNTER — Other Ambulatory Visit: Payer: Self-pay

## 2020-04-12 DIAGNOSIS — F331 Major depressive disorder, recurrent, moderate: Secondary | ICD-10-CM | POA: Diagnosis not present

## 2020-04-12 NOTE — Telephone Encounter (Signed)
pt states she wanted to see if she can get something to help her with her son passing.

## 2020-04-12 NOTE — Progress Notes (Signed)
Virtual Visit via Video Note  I connected with Rebecca Barrera on 04/12/20 at 9:06 AM EST by a video enabled telemedicine application and verified that I am speaking with the correct person using two identifiers.  Location: Patient: Home Provider: [facility] [place] office    I discussed the limitations of evaluation and management by telemedicine and the availability of in person appointments. The patient expressed understanding and agreed to proceed.  I provided 41 minutes of non-face-to-face time during this encounter.   [name], LCSW THERAPIST PROGRESS NOTE   Location:   Patient- Home/ Provider - [facility] [place] office   Session Time: Thursday 04/12/2020 9:06 AM  -9:47 AM   Participation Level: Active  Behavioral Response: CasualAlert/tearful/distraught  Type of Therapy: Individual Therapy  Treatment Goals addressed: begin healthy grieving process  Interventions: Supportive, CBT  Summary: Rebecca Barrera is a 59 y.o. female who is referred for services by psychiatrist Dr. Vanetta Shawl due to patient experiencing symptoms of depression. She denies any psychiatric hospitalizations. She participated in outpatient therapy at [facility] for several years. She reports grief and loss regarding having her cat euthanized on 12/08/2018 due to lung problems. She then lost her mother who died in 07-11-19.  Patient last was seen via virtual visit about 2 weeks ago.  She continues to experience grief and loss issues as well as depression related to the death of her 82 year old son by suicide.   She reports increased anger and frustration regarding the death of her son.  She blames son's wife for not intervening and says she thinks son's death was preventable.  She also expresses anger and disappointment as it seems as though her son's wife is going on with her life.    Suicidal/Homicidal: Nowithout intent/plan  Therapist Response: Reviewed symptoms, validated and normalized  feelings related to grief and loss, facilitated patient identifying and verbalizing feelings of anger, developed plan with patient to begin journaling her thoughts and feelings, encouraged patient to continue using support system and her spirituality as coping tools, also discussed ways to cope with upcoming holiday, will send patient handout (death of adult child)  Plan: Return again in 2 weeks   Diagnosis: Axis I: MDD, Recurrent, Moderate    Axis II: No diagnosis    [name], LCSW 04/12/2020

## 2020-04-12 NOTE — Telephone Encounter (Signed)
She is already on high dosesof clonazepam which should help anxiety. Dr. Vanetta Shawl can address other changes on her return

## 2020-04-16 NOTE — Telephone Encounter (Signed)
Rebecca Barrera- please contact the patient. I would not advise any medication change unless she is interested in adding olanzapine or risperidone, which she declined. Please advise her to continue to see [name] for therapy as it would be especially more helpful during this painful time. Please also advise the patient to contact [facility] only instead of [place] office if she were to have any questions for me. It seems like she has been contacting you both regarding the same concern.

## 2020-04-17 ENCOUNTER — Encounter: Payer: Self-pay | Admitting: Psychiatry

## 2020-04-17 ENCOUNTER — Telehealth (INDEPENDENT_AMBULATORY_CARE_PROVIDER_SITE_OTHER): Payer: Medicaid Other | Admitting: Psychiatry

## 2020-04-17 ENCOUNTER — Other Ambulatory Visit: Payer: Self-pay

## 2020-04-17 ENCOUNTER — Telehealth (HOSPITAL_COMMUNITY): Payer: Self-pay | Admitting: Psychiatry

## 2020-04-17 DIAGNOSIS — F411 Generalized anxiety disorder: Secondary | ICD-10-CM | POA: Diagnosis not present

## 2020-04-17 DIAGNOSIS — F331 Major depressive disorder, recurrent, moderate: Secondary | ICD-10-CM | POA: Diagnosis not present

## 2020-04-17 MED ORDER — RISPERIDONE 0.25 MG PO TABS: 0.2500 mg | ORAL_TABLET | Freq: Every day | ORAL | 0 refills | Status: DC

## 2020-04-17 NOTE — Patient Instructions (Signed)
1. Continue duloxetine 60 mg daily 2.Continuebupropion450 mg daily 3. Start risperidone 0.25 mg at night 3.Continueclonazepam 1 mgthree times aday as needed for anxiety  4.Next appointment:12/14 at 9:50 AM
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
## 2020-04-17 NOTE — Telephone Encounter (Signed)
Patient called in wanting more therapy appts scheduled once a week if possible to deal with the death of her son. Advised patient that provider does not have openings to schedule once a week. Pt advised to f/u with provider on 12/3 appt to discuss weekly appts. Pt is requesting to be called if any cancellations

## 2020-04-19 ENCOUNTER — Telehealth: Payer: Self-pay

## 2020-04-19 NOTE — Telephone Encounter (Signed)
received fax that patient risperidone .25mg  was approved from 04-18-20 to 04-18-21

## 2020-04-30 ENCOUNTER — Telehealth (INDEPENDENT_AMBULATORY_CARE_PROVIDER_SITE_OTHER): Payer: Medicaid Other | Admitting: Psychiatry

## 2020-04-30 ENCOUNTER — Other Ambulatory Visit: Payer: Self-pay

## 2020-04-30 ENCOUNTER — Encounter: Payer: Self-pay | Admitting: Psychiatry

## 2020-04-30 DIAGNOSIS — F331 Major depressive disorder, recurrent, moderate: Secondary | ICD-10-CM

## 2020-04-30 DIAGNOSIS — F330 Major depressive disorder, recurrent, mild: Secondary | ICD-10-CM

## 2020-04-30 DIAGNOSIS — F411 Generalized anxiety disorder: Secondary | ICD-10-CM

## 2020-04-30 MED ORDER — BUPROPION HCL ER (XL) 150 MG PO TB24: ORAL_TABLET | ORAL | 0 refills | Status: DC

## 2020-04-30 MED ORDER — ESCITALOPRAM OXALATE 10 MG PO TABS: ORAL_TABLET | ORAL | 1 refills | Status: DC

## 2020-04-30 MED ORDER — BUPROPION HCL ER (XL) 300 MG PO TB24: 300.0000 mg | ORAL_TABLET | Freq: Every day | ORAL | 0 refills | Status: DC

## 2020-04-30 MED ORDER — CLONAZEPAM 1 MG PO TABS: 1.0000 mg | ORAL_TABLET | Freq: Three times a day (TID) | ORAL | 0 refills | Status: DC
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
## 2020-05-02 ENCOUNTER — Other Ambulatory Visit: Payer: Self-pay

## 2020-05-02 ENCOUNTER — Other Ambulatory Visit: Payer: Self-pay | Admitting: Psychiatry

## 2020-05-02 MED ORDER — DULOXETINE HCL 30 MG PO CPEP: 30.0000 mg | ORAL_CAPSULE | Freq: Every day | ORAL | 0 refills | Status: DC

## 2020-05-02 NOTE — Telephone Encounter (Signed)
Medication management - patient called back after getting message.  Informed Dr. Vanetta Shawl had sent in Cymbalta 30 mg, #7 for her to take while tapering off medication.  Patient appreciative as she only has 60 mg capsules currently.  Stated she understood the plans for tapering off the Cymbalta and starting Lexapro.  Will call back if any problems with transition.

## 2020-05-02 NOTE — Telephone Encounter (Signed)
medication management - patient left a message she needed a new Cymbalta 30 mg order called in to take while going up on newly prescribed Lexapro.  Attempted to reach pt by phone but no answer so left pt a message to call back to verify if order needed for one week of Cymbalta at 30 mg while tapering off?  Requested patient call back to verify specific needs with medication transition.

## 2020-05-02 NOTE — Telephone Encounter (Signed)
Could you clarify this with the patient. She was instructed to do the following at her visit. If she wants to remain on duloxetine, I would not recommend starting lexapro.  1. Decrease duloxetine 30 mg daily for one week, then discontinue 2. Start lexapro 5 mg daily for one week, then 10 mg daily

## 2020-05-02 NOTE — Telephone Encounter (Signed)
pt called left message that she wanted to start takin 30mg  of cymbalta

## 2020-05-02 NOTE — Telephone Encounter (Signed)
Sorry, I missed to order Cymbalta 30 mg daily at her last visit. Order placed.

## 2020-05-03 ENCOUNTER — Other Ambulatory Visit: Payer: Self-pay

## 2020-05-03 ENCOUNTER — Ambulatory Visit (INDEPENDENT_AMBULATORY_CARE_PROVIDER_SITE_OTHER): Payer: Medicaid Other | Admitting: Psychiatry

## 2020-05-03 DIAGNOSIS — F331 Major depressive disorder, recurrent, moderate: Secondary | ICD-10-CM | POA: Diagnosis not present

## 2020-05-03 NOTE — Progress Notes (Signed)
Virtual Visit via Video Note  I connected with Rebecca Barrera on 05/03/20 at 9:10 AM EST by a video enabled telemedicine application and verified that I am speaking with the correct person using two identifiers.  Location: Patient: Home Provider: [facility] [place] office    I discussed the limitations of evaluation and management by telemedicine and the availability of in person appointments. The patient expressed understanding and agreed to proceed.   I provided 50 minutes of non-face-to-face time during this encounter.   [name], LCSW  THERAPIST PROGRESS NOTE   Location:   Patient- Home/ Provider - [facility]  office   Session Time: Thursday 05/03/2020 9:10 AM - 10:00 AM  Participation Level: Active  Behavioral Response: CasualAlert/tearful/distraught  Type of Therapy: Individual Therapy  Treatment Goals addressed: begin healthy grieving process  Interventions: Supportive, CBT  Summary: Rebecca Barrera is a 59 y.o. female who is referred for services by psychiatrist Dr. Vanetta Shawl due to patient experiencing symptoms of depression. She denies any psychiatric hospitalizations. She participated in outpatient therapy at [facility] for several years. She reports grief and loss regarding having her cat euthanized on 12/08/2018 due to lung problems. She then lost her mother who died in 2019-06-20.  Patient last was seen via virtual visit about 2 -3 weeks ago.  She continues to experience grief and loss issues as well as depression related to the death of her 63 year old son by suicide.   She reports being very distraught and overwhelmed during the Thanksgiving holiday.  She reports calling the crisis line but denies having any suicidal ideations.  She reports increased anger and frustration regarding son's wife and continues to blame her for her son's death.  Patient reports difficulty coping with the way her son died by suicide.  She did not witness son's death but   reports visual images of son actually shooting himself and states seeing this every time she closes her eyes.   Suicidal/Homicidal: Nowithout intent/plan  Therapist Response: Reviewed symptoms, validated and normalized feelings related to grief and loss, discussed traumatic grief, discussed rationale for using grounding techniques to cope with distressful images and feelings, will send patient handout on grounding techniques and develop plan for patient to use as coping tool,  facilitated patient identifying and verbalizing feelings of anger, encouraged patient to begin journaling her thoughts and feelings, encouraged patient to continue using support system and her spirituality as coping tools.  Plan: Return again in 2 weeks   Diagnosis: Axis I: MDD, Recurrent, Moderate    Axis II: No diagnosis    [name], LCSW 05/03/2020

## 2020-05-04 ENCOUNTER — Telehealth: Payer: Self-pay | Admitting: Psychiatry

## 2020-05-07 ENCOUNTER — Telehealth: Payer: Self-pay | Admitting: Psychiatry

## 2020-05-07 ENCOUNTER — Telehealth (HOSPITAL_COMMUNITY): Payer: Medicaid Other | Admitting: Psychiatry

## 2020-05-08 ENCOUNTER — Ambulatory Visit (HOSPITAL_COMMUNITY): Payer: Medicaid Other | Admitting: Psychiatry

## 2020-05-14 ENCOUNTER — Telehealth: Payer: Self-pay

## 2020-05-14 NOTE — Telephone Encounter (Signed)
pt called states that she can not take the lexapro she is having diarrhea and nausea

## 2020-05-14 NOTE — Telephone Encounter (Signed)
Advise her to discontinue lexapro. Would recommend restarting duloxetine 60 mg daily after her symptoms are resolved. Let me know if she needs refill for duloxetine.

## 2020-05-15 ENCOUNTER — Telehealth: Payer: Self-pay | Admitting: Psychiatry

## 2020-05-21 ENCOUNTER — Other Ambulatory Visit: Payer: Self-pay

## 2020-05-21 ENCOUNTER — Ambulatory Visit (INDEPENDENT_AMBULATORY_CARE_PROVIDER_SITE_OTHER): Payer: Medicaid Other | Admitting: Psychiatry

## 2020-05-21 DIAGNOSIS — F331 Major depressive disorder, recurrent, moderate: Secondary | ICD-10-CM

## 2020-05-21 NOTE — Progress Notes (Signed)
Virtual Visit via Video Note  I connected with Rebecca Barrera on 05/21/20 at 11:05 AM EST by a video enabled telemedicine application and verified that I am speaking with the correct person using two identifiers.  Location: Patient: Home Provider: [facility] [place] office    I discussed the limitations of evaluation and management by telemedicine and the availability of in person appointments. The patient expressed understanding and agreed to proceed.   I provided 45 minutes of non-face-to-face time during this encounter.   [name], LCSW THERAPIST PROGRESS NOTE     Session Time: Monday 05/21/2020 11:05 AM -  11:50 AM   Participation Level: Active  Behavioral Response: CasualAlert/tearful/distraught  Type of Therapy: Individual Therapy  Treatment Goals addressed: begin healthy grieving process  Interventions: Supportive, CBT  Summary: Rebecca Barrera is a 59 y.o. female who is referred for services by psychiatrist Dr. Vanetta Shawl due to patient experiencing symptoms of depression. She denies any psychiatric hospitalizations. She participated in outpatient therapy at [facility] for several years. She reports grief and loss regarding having her cat euthanized on 12/08/2018 due to lung problems. She then lost her mother who died in 06-30-19.  Patient last was seen via virtual visit about 2 -3 weeks ago.  She continues to experience grief and loss issues as well as depression related to the death of her 53 year old son by suicide.   She reports increased frustration, disappointment, and anger regarding daughter-in-law's behavior since patient's son's death.  She continues to blame daughter-in-law for his death.  She reports continued difficulty regarding son died by suicide.  Patient is trying to maintain positive self-care and does talk with her brother as well as some friends.   Suicidal/Homicidal: Nowithout intent/plan  Therapist Response: Reviewed symptoms, validated and  normalized feelings related to grief and loss, began to assist patient examine her thoughts and beliefs, about suicide discussed traumatic grief,  facilitated patient identifying and verbalizing feelings , encouraged patient to continue journaling her thoughts and feelings, discussed  ways to cope with the upcoming holiday, encouraged patient to continue using support system and her spirituality as coping tools.  Plan: Return again in 2 weeks   Diagnosis: Axis I: MDD, Recurrent, Moderate    Axis II: No diagnosis    [name], LCSW 05/21/2020
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
## 2020-05-29 ENCOUNTER — Telehealth: Payer: Self-pay | Admitting: *Deleted

## 2020-05-29 NOTE — Telephone Encounter (Signed)
I called her and let her know

## 2020-05-29 NOTE — Telephone Encounter (Signed)
Patient called and requested a sooner appt. I explained to her Dr Vanetta Shawl was out.  Patient stated her antidepressant was not effective and she wanted a change.  She stated her son committed suicide in October and she continues to have a hard time.  She states she is not suicidal and does contract for safety.  Her appt with MD is 06/04/2020.  She felt she would be ok to wait.  Patient provided with [facility] number and address.

## 2020-05-29 NOTE — Telephone Encounter (Signed)
I would rather wait for her to see Dr. Vanetta Shawl as well

## 2020-06-04 ENCOUNTER — Other Ambulatory Visit: Payer: Self-pay

## 2020-06-04 ENCOUNTER — Encounter: Payer: Self-pay | Admitting: Psychiatry

## 2020-06-04 ENCOUNTER — Telehealth (INDEPENDENT_AMBULATORY_CARE_PROVIDER_SITE_OTHER): Payer: Medicaid Other | Admitting: Psychiatry

## 2020-06-04 DIAGNOSIS — F330 Major depressive disorder, recurrent, mild: Secondary | ICD-10-CM

## 2020-06-04 DIAGNOSIS — F411 Generalized anxiety disorder: Secondary | ICD-10-CM

## 2020-06-04 MED ORDER — BUPROPION HCL ER (XL) 300 MG PO TB24: 300.0000 mg | ORAL_TABLET | Freq: Every day | ORAL | 0 refills | Status: DC

## 2020-06-04 MED ORDER — CLONAZEPAM 1 MG PO TABS: 1.0000 mg | ORAL_TABLET | Freq: Three times a day (TID) | ORAL | 0 refills | Status: DC

## 2020-06-04 MED ORDER — BUPROPION HCL ER (XL) 150 MG PO TB24: ORAL_TABLET | ORAL | 1 refills | Status: DC

## 2020-06-04 MED ORDER — DULOXETINE HCL 60 MG PO CPEP: 60.0000 mg | ORAL_CAPSULE | Freq: Every day | ORAL | 1 refills | Status: DC

## 2020-06-05 ENCOUNTER — Other Ambulatory Visit: Payer: Self-pay

## 2020-06-05 ENCOUNTER — Ambulatory Visit (HOSPITAL_COMMUNITY): Payer: Medicaid Other | Admitting: Psychiatry

## 2020-06-19 ENCOUNTER — Other Ambulatory Visit: Payer: Self-pay

## 2020-06-19 ENCOUNTER — Ambulatory Visit (HOSPITAL_COMMUNITY): Payer: Medicaid Other | Admitting: Psychiatry

## 2020-06-20 ENCOUNTER — Telehealth: Payer: Self-pay

## 2020-06-20 NOTE — Telephone Encounter (Signed)
Ask her if she is interested in trying other antidepressant- venlafaxine instead of duloxetine. Possible side effects including headache.

## 2020-06-20 NOTE — Telephone Encounter (Signed)
pt called left message that she is not doing well with her depression. she stated that you knew her situation and states that the medication either needs to be changed or needs something added.

## 2020-06-21 ENCOUNTER — Other Ambulatory Visit: Payer: Self-pay | Admitting: Psychiatry

## 2020-06-21 MED ORDER — VENLAFAXINE HCL ER 37.5 MG PO CP24: 37.5000 mg | ORAL_CAPSULE | Freq: Every day | ORAL | 0 refills | Status: DC

## 2020-06-21 MED ORDER — DULOXETINE HCL 30 MG PO CPEP: 30.0000 mg | ORAL_CAPSULE | Freq: Every day | ORAL | 0 refills | Status: DC

## 2020-06-21 MED ORDER — VENLAFAXINE HCL ER 75 MG PO CP24: ORAL_CAPSULE | ORAL | 0 refills | Status: DC

## 2020-06-21 NOTE — Telephone Encounter (Signed)
pt states she have to call back.

## 2020-06-21 NOTE — Telephone Encounter (Signed)
pt called back she states that she will try the medication and see if that does better. she states to send to [facility]

## 2020-06-21 NOTE — Telephone Encounter (Signed)
Please advise the following:  Week 1: Decrease duloxetine 30 mg daily, start venlafaxine 37.5 mg daily  Week2- : Discontinue duloxetine, increase venlafaxine 75 mg daily.  I ordered duloxetine 30 mg tab, venlafaxine 37.5 and 75 mg tab. Advise her to stay on venlafaxine 75 mg daily until the next visit unless she has any side effect from this change.

## 2020-06-22 NOTE — Telephone Encounter (Signed)
pt called bback from my message yesterday.. pt was given the instruction on medication

## 2020-06-26 ENCOUNTER — Ambulatory Visit (INDEPENDENT_AMBULATORY_CARE_PROVIDER_SITE_OTHER): Payer: Medicaid Other | Admitting: Psychiatry

## 2020-06-26 ENCOUNTER — Other Ambulatory Visit: Payer: Self-pay

## 2020-06-26 DIAGNOSIS — F330 Major depressive disorder, recurrent, mild: Secondary | ICD-10-CM | POA: Diagnosis not present

## 2020-06-26 NOTE — Progress Notes (Signed)
Virtual Visit via Video Note  I connected with [name] on 06/26/20 at 1:15 PM EST  by a video enabled telemedicine application and verified that I am speaking with the correct person using two identifiers.  Location: Patient: Home Provider: [facility] [place] office    I discussed the limitations of evaluation and management by telemedicine and the availability of in person appointments. The patient expressed understanding and agreed to proceed.  I provided 40  minutes of non-face-to-face time during this encounter.   [name], LCSW THERAPIST PROGRESS NOTE     Session Time: Tuesday 06/26/2020 1:15 PM - 1:55 PM   Participation Level: Active  Behavioral Response: CasualAlert/tearful/distraught  Type of Therapy: Individual Therapy  Treatment Goals addressed: begin healthy grieving process  Interventions: Supportive, CBT  Summary: Rebecca Barrera is a 60 y.o. female who is referred for services by psychiatrist Dr. Vanetta Shawl due to patient experiencing symptoms of depression. She denies any psychiatric hospitalizations. She participated in outpatient therapy at [facility] for several years. She reports grief and loss regarding having her cat euthanized on 12/08/2018 due to lung problems. She then lost her mother who died in July 01, 2019.  Patient last was seen via virtual visit about 4 weeks ago.  She continues to experience grief and loss issues as well as depression related to the death of her 66 year old son by suicide.   She reports she continues to perform daily activities such as cleaning her apartment, going to the mailbox, and taking care of her pet.  She continues to have regular contact with her family and a friend.  She reports increased depressed mood and sadness about a week ago.  She continues to struggle with accepting son's death especially the manner by which he died.  She expresses confusion.  She reports continuing to visualize son's death by suicide and worries  that he suffered.    Suicidal/Homicidal: Nowithout intent/plan  Therapist Response: Reviewed symptoms, validated and normalized feelings related to grief and loss, reviewed grief process and normalized going back and forth through the stages of grief, reviewed and developed plan with patient to use grounding techniques to cope with visual images, assisted patient identify other losses regarding the death of her son including lost dreams and hopes for her son, also assisted patient identify how she depended on her son and began to try to resolve the feelings of being left alone, developed plan with patient to read a grief coping card daily.  Plan: Return again in 2 weeks   Diagnosis: Axis I: MDD, Recurrent, Moderate    Axis II: No diagnosis    [name], LCSW 06/26/2020

## 2020-07-10 ENCOUNTER — Ambulatory Visit (INDEPENDENT_AMBULATORY_CARE_PROVIDER_SITE_OTHER): Payer: Medicaid Other | Admitting: Psychiatry

## 2020-07-10 ENCOUNTER — Other Ambulatory Visit: Payer: Self-pay

## 2020-07-10 DIAGNOSIS — F330 Major depressive disorder, recurrent, mild: Secondary | ICD-10-CM | POA: Diagnosis not present
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
## 2020-07-10 NOTE — Progress Notes (Signed)
Virtual Visit via Video Note  I connected with Rebecca Barrera on 07/10/20 at 1:10 PM EST  by a video enabled telemedicine application and verified that I am speaking with the correct person using two identifiers.  Location: Patient: Home Provider: [facility] [place] office   I discussed the limitations of evaluation and management by telemedicine and the availability of in person appointments. The patient expressed understanding and agreed to proceed.   I provided 45 minutes of non-face-to-face time during this encounter.   [name], LCSW  THERAPIST PROGRESS NOTE     Session Time: Tuesday 07/10/2020 1:10 PM - 1:55 PM   Participation Level: Active  Behavioral Response: CasualAlert/sad   Type of Therapy: Individual Therapy  Treatment Goals addressed: begin healthy grieving process  Interventions: Supportive, CBT  Summary: Rebecca Barrera is a 60 y.o. female who is referred for services by psychiatrist Dr. Vanetta Shawl due to patient experiencing symptoms of depression. She denies any psychiatric hospitalizations. She participated in outpatient therapy at [facility] for several years. She reports grief and loss regarding having her cat euthanized on 12/08/2018 due to lung problems. She then lost her mother who died in 06/21/2019.  Patient last was seen via virtual visit about 4 weeks ago.  She continues to experience grief and loss issues as well as depression related to the death of her 73 year old son by suicide.   She reports some improvement in mood as she has been taking increased dosage of Effexor as instructed by psychiatrist Dr. Vanetta Shawl.  Reports decreased crying spells and beginning to experience increased motivation to do things.  She continues to perform daily household tasks and talk with family and friends.  She also has joined a Administrator, sports group for mothers of sons who died by suicide.  Patient also reports decreased visual images of son's death.  She has been using  distracting activities and grounding techniques.  She expresses increased acceptance of her son's death and reports using her spirituality.  She states it was her son's time to go.  She continues to struggle with coping with the manner by which her son died.  Patient also reports frequently reading previously mailed handouts on grief and loss and says this has been helpful.  She has trouble identifying her feelings about suicide.  She also worries about possible conversations with others about the manner in which he died and fears people will have negative thoughts about her son.    Suicidal/Homicidal: Nowithout intent/plan  Therapist Response: Reviewed symptoms, administered PHQ-9/C SS RS, praised and reinforced patient's medication compliance/use of distracting activities and grounding techniques, praised and reinforced patient's initiative to join social media group, discussed effects, assisted patient began to explore her thoughts and feelings about suicide, also assisted patient began to try to identify her feelings about her son dying by suicide, developed plan with patient to identify and examine her feelings between sessions,  will continue processing grief and loss issues next session, encouraged patient to continue using grief coping cards, assisted patient identify ways to improve assertiveness skills/set/maintain limits with others regarding conversation about her son's death.    Plan: Return again in 2 weeks   Diagnosis: Axis I: MDD, Recurrent, Moderate    Axis II: No diagnosis    [name], LCSW 07/10/2020

## 2020-07-16 ENCOUNTER — Encounter: Payer: Self-pay | Admitting: Psychiatry

## 2020-07-16 ENCOUNTER — Other Ambulatory Visit: Payer: Self-pay

## 2020-07-16 ENCOUNTER — Telehealth (INDEPENDENT_AMBULATORY_CARE_PROVIDER_SITE_OTHER): Payer: Medicaid Other | Admitting: Psychiatry

## 2020-07-16 DIAGNOSIS — F330 Major depressive disorder, recurrent, mild: Secondary | ICD-10-CM

## 2020-07-16 DIAGNOSIS — F411 Generalized anxiety disorder: Secondary | ICD-10-CM

## 2020-07-16 MED ORDER — CLONAZEPAM 1 MG PO TABS: 1.0000 mg | ORAL_TABLET | Freq: Three times a day (TID) | ORAL | 0 refills | Status: DC

## 2020-07-16 MED ORDER — VENLAFAXINE HCL ER 150 MG PO CP24: 150.0000 mg | ORAL_CAPSULE | Freq: Every day | ORAL | 1 refills | Status: DC

## 2020-07-16 MED ORDER — BUPROPION HCL ER (XL) 300 MG PO TB24: 300.0000 mg | ORAL_TABLET | Freq: Every day | ORAL | 2 refills | Status: DC

## 2020-07-16 MED ORDER — BUPROPION HCL ER (XL) 150 MG PO TB24: ORAL_TABLET | ORAL | 2 refills | Status: DC

## 2020-07-24 ENCOUNTER — Ambulatory Visit (INDEPENDENT_AMBULATORY_CARE_PROVIDER_SITE_OTHER): Payer: Medicaid Other | Admitting: Psychiatry

## 2020-07-24 ENCOUNTER — Other Ambulatory Visit: Payer: Self-pay

## 2020-07-24 DIAGNOSIS — F330 Major depressive disorder, recurrent, mild: Secondary | ICD-10-CM | POA: Diagnosis not present

## 2020-07-24 NOTE — Progress Notes (Signed)
Virtual Visit via Video Note  I connected with Rebecca Barrera on 07/24/20 at 1:05 PM EST  by a video enabled telemedicine application and verified that I am speaking with the correct person using two identifiers.  Location: Patient: Home Provider: [facility]  office    I discussed the limitations of evaluation and management by telemedicine and the availability of in person appointments. The patient expressed understanding and agreed to proceed.   I provided 46 minutes of non-face-to-face time during this encounter.   [name], LCSW THERAPIST PROGRESS NOTE     Session Time: Tuesday 07/24/2020 1:05 PM - 1:51 PM   Participation Level: Active  Behavioral Response: CasualAlert/sad   Type of Therapy: Individual Therapy  Treatment Goals addressed: begin healthy grieving process  Interventions: Supportive, CBT  Summary: Rebecca Barrera is a 60 y.o. female who is referred for services by psychiatrist Dr. Vanetta Shawl due to patient experiencing symptoms of depression. She denies any psychiatric hospitalizations. She participated in outpatient therapy at [facility] for several years. She reports grief and loss regarding having her cat euthanized on 12/08/2018 due to lung problems. She then lost her mother who died in 02-Jul-2019.  Patient last was seen via virtual visit about 2 weeks ago.  She continues to experience grief and loss issues as well as depression related to the death of her 28 year old son by suicide.  However, she reports symptoms of depression have decreased in intensity and frequency.  She reports taking Effexor as instructed by her psychiatrist Dr. Vanetta Shawl has continued to be helpful.  She reports increased motivation and now doing more activities around her home.  She reports she has also scheduled doctors appointments and no longer is dreading to attend.  She reports crying spells have decreased from daily to about every 3 days.  Patient reports completing homework  identifying and examining her feelings toward her son about completing suicide.  She verbalizes anger, sadness, and regret.  Patient also now is able to verbalize the word suicide in relation to her son's death.  She expresses increased acceptance of son's manner of death.   She also expresses acceptance that she will never know why he did this. She reports she has not googled causes of suicide for the past 2 weeks. .  She reports plans to see the death certificate and hopes this will give her more closure.  Patient states knowing she needs to and wanting to move on with her life.  Patient verbalizes positive memories of her son in session today.  She also laughs while telling funny memories of her son.  Suicidal/Homicidal: Nowithout intent/plan  Therapist Response: Reviewed symptoms, praised and reinforced patient's medication compliance,  praised and reinforced patient's efforts to identify and examine her feelings about son dying by suicide, facilitated patient expressing anger/regret, and sadness, reviewed stages of grief and reiterated going back and forth through the stages, also discussed stage patient currently is experiencing, began to discuss ways for patient to have healthy connection with her son and reinvest in life, developed plan with patient to make a list of activities she would like to pursue to accomplish this in preparation for next session   Plan: Return again in 2 weeks   Diagnosis: Axis I: MDD, Recurrent, Moderate    Axis II: No diagnosis    [name], LCSW 07/24/2020

## 2020-07-25 ENCOUNTER — Telehealth: Payer: Self-pay

## 2020-07-25 NOTE — Telephone Encounter (Signed)
Could you ask her if she is willing to try 112.5 mg? If not, advise her to go back to 75 mg daily.

## 2020-07-25 NOTE — Telephone Encounter (Signed)
pt states that the effexor 150mg  is giving her headaches. she wanted to talk with you about adjustment.

## 2020-07-30 NOTE — Telephone Encounter (Signed)
Did she mean headache ? If so, I would advise her to stay on the current dose of venlafaxine at this time with the hope that it subsides after several days. If she meant by issues with her mood, I would advise to try increasing venlafaxine 112.5 mg. I am not thinking of adding other medication at this time.

## 2020-07-30 NOTE — Telephone Encounter (Signed)
pt states she cut back on to 75mg  on the effexor but she still having issues she wanted to know if you could add something to the effexor.
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
## 2020-07-31 ENCOUNTER — Other Ambulatory Visit: Payer: Self-pay | Admitting: Psychiatry

## 2020-07-31 MED ORDER — VENLAFAXINE HCL ER 37.5 MG PO CP24: ORAL_CAPSULE | ORAL | 0 refills | Status: DC

## 2020-07-31 MED ORDER — VENLAFAXINE HCL ER 75 MG PO CP24: ORAL_CAPSULE | ORAL | 0 refills | Status: DC

## 2020-07-31 NOTE — Telephone Encounter (Signed)
pt states she will do the 112.5mg  but she only has 75mg  and 190m.    so she needs a 37.5mg  called into  apothecary  she also states she only has 5 of the 75mg . so she needs 75mg   also

## 2020-07-31 NOTE — Telephone Encounter (Signed)
Ordered

## 2020-08-06 ENCOUNTER — Other Ambulatory Visit (HOSPITAL_COMMUNITY): Payer: Self-pay | Admitting: Internal Medicine

## 2020-08-06 DIAGNOSIS — M8580 Other specified disorders of bone density and structure, unspecified site: Secondary | ICD-10-CM

## 2020-08-09 ENCOUNTER — Other Ambulatory Visit: Payer: Self-pay

## 2020-08-09 ENCOUNTER — Ambulatory Visit (INDEPENDENT_AMBULATORY_CARE_PROVIDER_SITE_OTHER): Payer: Medicaid Other | Admitting: Psychiatry

## 2020-08-09 DIAGNOSIS — Z634 Disappearance and death of family member: Secondary | ICD-10-CM | POA: Diagnosis not present

## 2020-08-09 DIAGNOSIS — F330 Major depressive disorder, recurrent, mild: Secondary | ICD-10-CM | POA: Diagnosis not present

## 2020-08-09 NOTE — Progress Notes (Signed)
Virtual Visit via Telephone Note  I connected with Rebecca Barrera on 08/09/20 at 1:05 PM EST  by telephone and verified that I am speaking with the correct person using two identifiers.  Location: Patient: Home Provider: [facility] [place] office    I discussed the limitations, risks, security and privacy concerns of performing an evaluation and management service by telephone and the availability of in person appointments. I also discussed with the patient that there may be a patient responsible charge related to this service. The patient expressed understanding and agreed to proceed.  I provided 52 minutes of non-face-to-face time during this encounter.   [name], LCSW THERAPIST PROGRESS NOTE     Session Time: Thursday  08/09/2020 1:05 PM - 1:57 PM   Participation Level: Active  Behavioral Response: CasualAlert/sad   Type of Therapy: Individual Therapy  Treatment Goals addressed: begin healthy grieving process  Interventions: Supportive, CBT  Summary: [name] is a 60 y.o. female who is referred for services by psychiatrist Dr. Vanetta Shawl due to patient experiencing symptoms of depression. She denies any psychiatric hospitalizations. She participated in outpatient therapy at [facility] for several years. She reports grief and loss regarding having her cat euthanized on 12/08/2018 due to lung problems. She then lost her mother who died in 03-Jul-2019.  Patient last was seen via virtual visit about 2 weeks ago.  She continues to experience grief and loss issues as well as depression related to the death of her 38 year old son by suicide.   She reports symptoms of depression continue to decrease in intensity and frequency.  Crying spells have decreased from every 3 days to every 4 days.  She has appointments scheduled for mammogram and a bone density test next week and plans to follow through with appointments.  She reports now really wanting to make more efforts to engage in  life as she expresses increased acceptance of her son's death.  She saw her son's death certificate and reports this has given her some closure.  She now reports being ready to go to her son's grave and plans to go next week with her brother and sister-in-law.  She is hopeful this will give her more closure.  Suicidal/Homicidal: Nowithout intent/plan  Therapist Response: Reviewed symptoms, praised and reinforced patient's efforts to improve self-care, began to discuss other ways to reinvest in life, assisted patient identify and process her feelings about going to son's grave, talked about some of the possible benefits of participating in a formal ritual along with support from her family, discussed ways to use support and to use her coping skills    Plan: Return again in 2 weeks   Diagnosis: Axis I: MDD, Recurrent, Moderate    Axis II: No diagnosis    [name], LCSW 08/09/2020

## 2020-08-13 ENCOUNTER — Telehealth (INDEPENDENT_AMBULATORY_CARE_PROVIDER_SITE_OTHER): Payer: Medicaid Other | Admitting: Psychiatry

## 2020-08-13 ENCOUNTER — Other Ambulatory Visit: Payer: Self-pay

## 2020-08-13 ENCOUNTER — Encounter: Payer: Self-pay | Admitting: Psychiatry

## 2020-08-13 DIAGNOSIS — F411 Generalized anxiety disorder: Secondary | ICD-10-CM | POA: Diagnosis not present

## 2020-08-13 DIAGNOSIS — F330 Major depressive disorder, recurrent, mild: Secondary | ICD-10-CM

## 2020-08-13 MED ORDER — CLONAZEPAM 1 MG PO TABS: 1.0000 mg | ORAL_TABLET | Freq: Three times a day (TID) | ORAL | 0 refills | Status: DC

## 2020-08-13 MED ORDER — VENLAFAXINE HCL ER 75 MG PO CP24: ORAL_CAPSULE | ORAL | 0 refills | Status: DC

## 2020-08-13 MED ORDER — VENLAFAXINE HCL ER 37.5 MG PO CP24: ORAL_CAPSULE | ORAL | 0 refills | Status: DC

## 2020-08-13 NOTE — Patient Instructions (Signed)
1.continue venlafaxine 112.5 mg daily  2.Continuebupropion450 mg daily 3.Continueclonazepam 1 mgthree times aday as needed for anxiety  4.Next appointment:4/14 at 3:30

## 2020-08-16 ENCOUNTER — Other Ambulatory Visit (HOSPITAL_COMMUNITY): Payer: Medicaid Other

## 2020-08-16 ENCOUNTER — Ambulatory Visit (HOSPITAL_COMMUNITY): Payer: Medicaid Other

## 2020-08-23 ENCOUNTER — Other Ambulatory Visit: Payer: Self-pay

## 2020-08-23 ENCOUNTER — Ambulatory Visit (INDEPENDENT_AMBULATORY_CARE_PROVIDER_SITE_OTHER): Payer: Medicaid Other | Admitting: Psychiatry

## 2020-08-23 DIAGNOSIS — Z634 Disappearance and death of family member: Secondary | ICD-10-CM

## 2020-08-23 DIAGNOSIS — F330 Major depressive disorder, recurrent, mild: Secondary | ICD-10-CM | POA: Diagnosis not present

## 2020-08-23 NOTE — Progress Notes (Signed)
Virtual Visit via Video Note  I connected with Rebecca Barrera on 08/23/20 at 1:10 PM EDT  by a video enabled telemedicine application and verified that I am speaking with the correct person using two identifiers.  Location: Patient: Home Provider: [facility]  [place] office    I discussed the limitations of evaluation and management by telemedicine and the availability of in person appointments. The patient expressed understanding and agreed to proceed.   I provided 39 minutes of non-face-to-face time during this encounter.   [name], LCSW THERAPIST PROGRESS NOTE     Session Time: Thursday  08/23/2020 1:10 PM  - 1:49 PM   Participation Level: Active  Behavioral Response: CasualAlert/sad   Type of Therapy: Individual Therapy  Treatment Goals addressed: begin healthy grieving process  Interventions: Supportive, CBT  Summary: Rebecca Barrera is a 60 y.o. female who is referred for services by psychiatrist Dr. Vanetta Shawl due to patient experiencing symptoms of depression. She denies any psychiatric hospitalizations. She participated in outpatient therapy at [facility] for several years. She reports grief and loss regarding having her cat euthanized on 12/08/2018 due to lung problems. She then lost her mother who died in 19-Jun-2019.  Patient last was seen via virtual visit about 2 weeks ago.  She continues to experience grief and loss issues as well as depression related to the death of her 20 year old son by suicide.   She reports symptoms of depression continue to decrease in intensity and frequency.  Crying spells have decreased.  She reports increased motivation and desire to participate and activities.  She reports doing more things in her house like she used to do such as folding clothes.  She also reports she has gone walking outside.  She reports she does not think about son daily like she used to and reports no longer feeling guilt about this.  She did not visit son's grave as  she had planned as she reports she just was not ready.  She still reports wanting more closure but not wanting to go to his grave as she fears she will spiral into a deep depression.  Suicidal/Homicidal: Nowithout intent/plan  Therapist Response: Reviewed symptoms, praised and reinforced patient's increased involvement in activities, discussed effects, efforts to improve self-care, began to discuss other ways to reinvest in life, assisted patient identify and process her feelings about going to son's grave, explored closure as a process and ways to obtain closure, assisted patient identify ways she obtained closure in  the loss of other deceased loved ones, discussed the possibility of patient writing son a letter  Plan: Return again in 2 weeks   Diagnosis: Axis I: MDD, Recurrent, Moderate    Axis II: No diagnosis    [name], LCSW 08/23/2020

## 2020-08-29 ENCOUNTER — Ambulatory Visit (HOSPITAL_COMMUNITY): Payer: Medicaid Other

## 2020-08-29 ENCOUNTER — Telehealth (HOSPITAL_COMMUNITY): Payer: Self-pay | Admitting: *Deleted

## 2020-08-29 ENCOUNTER — Other Ambulatory Visit (HOSPITAL_COMMUNITY): Payer: Medicaid Other

## 2020-08-29 NOTE — Telephone Encounter (Signed)
Patient called stating she would like to talk with provider. Per pt something have come up and she tried to work it out and its hard. Per pt she is just afraid she might go into depression again. Per pt she wants to schedule sooner appt before April 7th which is next week but provider do not have any openings before then. Staff informed patient that message will be send to provider to advise.

## 2020-08-30 ENCOUNTER — Ambulatory Visit (INDEPENDENT_AMBULATORY_CARE_PROVIDER_SITE_OTHER): Payer: Medicaid Other | Admitting: Psychiatry

## 2020-08-30 ENCOUNTER — Other Ambulatory Visit: Payer: Self-pay

## 2020-08-30 ENCOUNTER — Ambulatory Visit (HOSPITAL_COMMUNITY): Payer: Medicaid Other | Admitting: Psychiatry

## 2020-08-30 DIAGNOSIS — F330 Major depressive disorder, recurrent, mild: Secondary | ICD-10-CM | POA: Diagnosis not present

## 2020-08-30 NOTE — Progress Notes (Signed)
Virtual Visit via Telephone Note  I connected with Rebecca Barrera on 08/30/20 at 11:18 AM EDT  by telephone and verified that I am speaking with the correct person using two identifiers.  Location: Patient: Home Provider: [facility] [place] office    I discussed the limitations, risks, security and privacy concerns of performing an evaluation and management service by telephone and the availability of in person appointments. I also discussed with the patient that there may be a patient responsible charge related to this service. The patient expressed understanding and agreed to proceed.    I provided 37 minutes of non-face-to-face time during this encounter.   [name], LCSW THERAPIST PROGRESS NOTE     Session Time: Thursday  08/30/2020 11:18 AM - 11:55 AM       Participation Level: Active  Behavioral Response: CasualAlert/sad   Type of Therapy: Individual Therapy  Treatment Goals addressed: begin healthy grieving process  Interventions: Supportive, CBT  Summary: Rebecca Barrera is a 60 y.o. female who is referred for services by psychiatrist Dr. Vanetta Shawl due to patient experiencing symptoms of depression. She denies any psychiatric hospitalizations. She participated in outpatient therapy at [facility] for several years. She reports grief and loss regarding having her cat euthanized on 12/08/2018 due to lung problems. She then lost her mother who died in 2019/07/07.  Patient last was seen via virtual visit about a week ago.  She is seen today earlier than her scheduled appointment.  Per patient's report, she is experiencing increased stressed due to family conflict.  She suspects her sister has made negative comments to her brother and patient's daughter-in-law who have either blocked patient's contacts or do not respond to her efforts to contact them.  Patient states feeling as though she is black ball from the family.  Per her report, her sister has a pattern of this type of  behavior of starting trouble within the family.  Patient states feeling all alone. She reports fearing she will go into a deep depression but trying to use coping statements to manage.  She also reports that she has attempted to try to find out why family members are not responding to her but also expresses acceptance that she may never know.   Suicidal/Homicidal: Nowithout intent/plan  Therapist Response: Reviewed symptoms, discussed stressors, facilitated expression of thoughts and feelings, validated feelings, praised and reinforced patient's efforts to use assertiveness skills and to also respect others limits, discussed acceptance, assisted patient identify ways she can remain engaged in activities including following through with medical appointments, taking care of her pet, going outside and walking, also assisted patient identify strengths she used in previous family situation with sister and how to apply these to her current situation.  Plan: Return again in 2 weeks   Diagnosis: Axis I: MDD, Recurrent, Moderate    Axis II: No diagnosis    [name], LCSW 08/30/2020

## 2020-09-06 ENCOUNTER — Other Ambulatory Visit: Payer: Self-pay

## 2020-09-06 ENCOUNTER — Ambulatory Visit (INDEPENDENT_AMBULATORY_CARE_PROVIDER_SITE_OTHER): Payer: Medicaid Other | Admitting: Psychiatry

## 2020-09-06 DIAGNOSIS — F330 Major depressive disorder, recurrent, mild: Secondary | ICD-10-CM | POA: Diagnosis not present

## 2020-09-06 NOTE — Progress Notes (Signed)
Virtual Visit via Video Note  I connected with Rebecca Barrera on 09/06/20 at 1:11 PM EDT  by a video enabled telemedicine application and verified that I am speaking with the correct person using two identifiers.  Location: Patient: Home Provider: [facility] [place] office    I discussed the limitations of evaluation and management by telemedicine and the availability of in person appointments. The patient expressed understanding and agreed to proceed.   I provided 43 minutes of non-face-to-face time during this encounter.   [name], LCSW   THERAPIST PROGRESS NOTE     Session Time: Thursday  09/06/2020  1:11 PM  -  1:54 PM    Participation Level: Active  Behavioral Response: CasualAlert/sad   Type of Therapy: Individual Therapy  Treatment Goals addressed: begin healthy grieving process  Interventions: Supportive, CBT  Summary: Rebecca Barrera is a 60 y.o. female who is referred for services by psychiatrist Dr. Vanetta Shawl due to patient experiencing symptoms of depression. She denies any psychiatric hospitalizations. She participated in outpatient therapy at [facility] for several years. She reports grief and loss regarding having her cat euthanized on 12/08/2018 due to lung problems. She then lost her mother who died in 07/02/2019.  Patient last was seen via virtual visit about a week ago.  She reports coping better and being less stressed regarding family conflict. She has resumed contact with sister-in-law and her daughter-in-law.  She expresses sadness but acceptance she has no contact with her brother.  She reports increased stress regarding increased thoughts about her son's method of death.  Per her report, her sisters triggered patient again having doubts about the way her son died.  This triggered images of son possibly struggling with someone when he died.  However, patient reports she has been able to challenge those thoughts and images by using coping statements and  reminding herself of information on son's death certificate. Suicidal/Homicidal: Nowithout intent/plan  Therapist Response: Reviewed symptoms, discussed stressors, facilitated expression of thoughts and feelings, validated feelings, praised and reinforced patient's efforts to use helpful coping strategies, reviewed grounding techniques and develop plan for patient to use when experiencing distressful images, developed plan with patient to bring 3 items that remind her of her son to next session  Plan: Return again in 2 weeks   Diagnosis: Axis I: MDD, Recurrent, Moderate    Axis II: No diagnosis    [name], LCSW 09/06/2020
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
## 2020-09-13 ENCOUNTER — Encounter (HOSPITAL_COMMUNITY): Payer: Self-pay

## 2020-09-13 ENCOUNTER — Encounter: Payer: Self-pay | Admitting: Psychiatry

## 2020-09-13 ENCOUNTER — Other Ambulatory Visit: Payer: Self-pay

## 2020-09-13 ENCOUNTER — Telehealth (INDEPENDENT_AMBULATORY_CARE_PROVIDER_SITE_OTHER): Payer: Medicaid Other | Admitting: Psychiatry

## 2020-09-13 DIAGNOSIS — F330 Major depressive disorder, recurrent, mild: Secondary | ICD-10-CM | POA: Diagnosis not present

## 2020-09-13 DIAGNOSIS — F411 Generalized anxiety disorder: Secondary | ICD-10-CM | POA: Diagnosis not present

## 2020-09-13 MED ORDER — VENLAFAXINE HCL ER 150 MG PO CP24: 150.0000 mg | ORAL_CAPSULE | Freq: Every day | ORAL | 1 refills | Status: DC

## 2020-09-13 MED ORDER — CLONAZEPAM 1 MG PO TABS: 1.0000 mg | ORAL_TABLET | Freq: Three times a day (TID) | ORAL | 1 refills | Status: DC

## 2020-09-13 NOTE — Patient Instructions (Signed)
1. Increase venlafaxine 150 mg daily (increase from 112.5 mg) 2.Continuebupropion450 mg daily 3.Continueclonazepam 1 mgthree times aday as needed for anxiety 4.Next appointment:6/2 at 4 PM

## 2020-09-19 ENCOUNTER — Telehealth: Payer: Self-pay

## 2020-09-19 NOTE — Telephone Encounter (Signed)
Noted, thanks!

## 2020-09-19 NOTE — Telephone Encounter (Signed)
pt states she she had the nause an a headache withthe effexor 150mg .   so she stated that she started papeffexor 37.5mg  and 75mg  again.

## 2020-09-20 ENCOUNTER — Other Ambulatory Visit: Payer: Self-pay | Admitting: Psychiatry

## 2020-09-20 ENCOUNTER — Other Ambulatory Visit: Payer: Self-pay

## 2020-09-20 ENCOUNTER — Ambulatory Visit (INDEPENDENT_AMBULATORY_CARE_PROVIDER_SITE_OTHER): Payer: Medicaid Other | Admitting: Psychiatry

## 2020-09-20 ENCOUNTER — Telehealth: Payer: Self-pay

## 2020-09-20 DIAGNOSIS — F330 Major depressive disorder, recurrent, mild: Secondary | ICD-10-CM

## 2020-09-20 MED ORDER — VENLAFAXINE HCL ER 75 MG PO CP24: 75.0000 mg | ORAL_CAPSULE | Freq: Every day | ORAL | 1 refills | Status: DC

## 2020-09-20 NOTE — Telephone Encounter (Signed)
Discussed with the patient. She states that she has been tremors for a while, and it has been more bothersome for the patient lately. She tried Effexor 150 mg daily only for a few days, and thinks current dose is affecting the tremor. Will lower venlafaxine to 75 mg daily.

## 2020-09-20 NOTE — Telephone Encounter (Signed)
pt states she is having temors and she states that she believes it is the wellbutrin but she wanted to speak with you about it.

## 2020-09-20 NOTE — Progress Notes (Signed)
Virtual Visit via Video Note  I connected with [name] on 09/20/20 at 1:08 PM EDT  by a video enabled telemedicine application and verified that I am speaking with the correct person using two identifiers.  Location: Patient: Home Provider: [facility] [place] office    I discussed the limitations of evaluation and management by telemedicine and the availability of in person appointments. The patient expressed understanding and agreed to proceed.  I provided 46 minutes of non-face-to-face time during this encounter.   [name], LCSW   THERAPIST PROGRESS NOTE     Session Time: Thursday  09/20/2020  1:08 PM -  1:54 PM   Participation Level: Active  Behavioral Response: CasualAlert/sad   Type of Therapy: Individual Therapy  Treatment Goals addressed: begin healthy grieving process  Interventions: Supportive, CBT  Summary: Rebecca Barrera is a 60 y.o. female who is referred for services by psychiatrist Dr. Vanetta Shawl due to patient experiencing symptoms of depression. She denies any psychiatric hospitalizations. She participated in outpatient therapy at [facility] for several years. She reports grief and loss regarding having her cat euthanized on 12/08/2018 due to lung problems. She then lost her mother who died in Jun 13, 2019.  Patient last was seen via virtual visit about two weeks  ago.  She reports continued grief and loss issues regarding her deceased son.  She reports contacting the funeral home and asking questions about the appearance of her son's body.  The responses provided some relief as she had envisioned the appearance being much worse.  Images of son's death have decreased in intensity and frequency per her report.  She continues to try to use grounding techniques when images do occur.  She reports increased sadness and decreased involvement in activity this past week due to increased thoughts about son.  She still tries to engage in some activity light household  task and going outside.  She also reports resumed contact with daughter has been helpful.  She also has been trying to talk to 2 of her friends which also has been helpful.  Patient brings 3 items of son to today's session as requested by clinician.   Suicidal/Homicidal: Nowithout intent/plan  Therapist Response: Reviewed symptoms, patient reinforced patient's use of grounding techniques, developed plan with patient to keep grounding techniques easily accessible at bedside or coffee table, normalized increased feelings of sadness as part of the grieving process and integrated grief, reviewed the role of behavioral activation and coping with depression, developed plan with patient to use activity menu and daily planning to increase behavioral activation, patient agrees to bring completed forms to next session, facilitated patient sharing thoughts/feelings, and memories associated with one of the items she brought to today's session to assist patient  recalling pleasant memories to help balance painful memories   Plan: Return again in 2 weeks   Diagnosis: Axis I: MDD, Recurrent, Moderate    Axis II: No diagnosis    [name], LCSW 09/20/2020

## 2020-09-26 ENCOUNTER — Telehealth: Payer: Self-pay

## 2020-09-26 NOTE — Telephone Encounter (Signed)
pt states she got rid of the 112.5 because of the tremors.

## 2020-09-26 NOTE — Telephone Encounter (Signed)
I made her an appt for tomorrow at 2:00 since you don't have a new pt scheduled.

## 2020-09-26 NOTE — Telephone Encounter (Signed)
with the effexor xr 75mg  tremor went away but her depression is back. and she crying.

## 2020-09-26 NOTE — Telephone Encounter (Signed)
I would recommend she goes back to 112.5 mg daily as she was doing better on that dose without significant tremors.

## 2020-09-27 ENCOUNTER — Encounter: Payer: Self-pay | Admitting: Psychiatry

## 2020-09-27 ENCOUNTER — Other Ambulatory Visit: Payer: Self-pay

## 2020-09-27 ENCOUNTER — Telehealth (INDEPENDENT_AMBULATORY_CARE_PROVIDER_SITE_OTHER): Payer: Medicaid Other | Admitting: Psychiatry

## 2020-09-27 ENCOUNTER — Telehealth: Payer: Self-pay

## 2020-09-27 DIAGNOSIS — F411 Generalized anxiety disorder: Secondary | ICD-10-CM | POA: Diagnosis not present

## 2020-09-27 DIAGNOSIS — F330 Major depressive disorder, recurrent, mild: Secondary | ICD-10-CM

## 2020-09-27 MED ORDER — NORTRIPTYLINE HCL 25 MG PO CAPS: 25.0000 mg | ORAL_CAPSULE | Freq: Every day | ORAL | 1 refills | Status: DC

## 2020-09-27 NOTE — Telephone Encounter (Signed)
pt just called and she stated that she did not want to take the pamelor because of the side affects and she wanted to know if it is anything else
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
## 2020-09-27 NOTE — Patient Instructions (Signed)
1. Restart venlafaxine 75 mg daily  2.Continuebupropion450 mg daily 3. Start nortriptyline 25 mg at night  4.Continueclonazepam 1 mgthree times aday as needed for anxiety 5.Next appointment:6/2 at 4 P

## 2020-09-27 NOTE — Telephone Encounter (Signed)
Noted  

## 2020-09-27 NOTE — Telephone Encounter (Signed)
Ask if she is interested in trying risperidone 0.5 mg at night. Side effects including drowsiness, increase in appetite/weight, elevated blood sugar/cholesterol, tremors. I would still recommend trying Pamelor first as any medication she may try will likely have more potential side effects, although these are good medication.

## 2020-10-01 NOTE — Telephone Encounter (Signed)
noted 

## 2020-10-01 NOTE — Telephone Encounter (Signed)
Pt called back she stated that she would try the risperidone.

## 2020-10-01 NOTE — Telephone Encounter (Signed)
pt called back she states that she will stay on what she got.

## 2020-10-04 ENCOUNTER — Other Ambulatory Visit: Payer: Self-pay

## 2020-10-04 ENCOUNTER — Ambulatory Visit (INDEPENDENT_AMBULATORY_CARE_PROVIDER_SITE_OTHER): Payer: Medicaid Other | Admitting: Psychiatry

## 2020-10-04 DIAGNOSIS — Z634 Disappearance and death of family member: Secondary | ICD-10-CM | POA: Diagnosis not present

## 2020-10-04 DIAGNOSIS — F330 Major depressive disorder, recurrent, mild: Secondary | ICD-10-CM

## 2020-10-04 NOTE — Progress Notes (Signed)
Virtual Visit via Video Note  I connected with Rebecca Barrera on 10/04/20 at 1:20 PM EDT  by a video enabled telemedicine application and verified that I am speaking with the correct person using two identifiers.  Location: Patient: Home Provider: [facility] [place] office    I discussed the limitations of evaluation and management by telemedicine and the availability of in person appointments. The patient expressed understanding and agreed to proceed.   I provided 35 minutes of non-face-to-face time during this encounter.   [name], LCSW  THERAPIST PROGRESS NOTE     Session Time: Thursday  10/04/2020  1:20 PM - 1:55 PM   Participation Level: Active  Behavioral Response: CasualAlert/sad   Type of Therapy: Individual Therapy  Treatment Goals addressed: begin healthy grieving process  Interventions: Supportive, CBT  Summary: Rebecca Barrera is a 60 y.o. female who is referred for services by psychiatrist Dr. Vanetta Shawl due to patient experiencing symptoms of depression. She denies any psychiatric hospitalizations. She participated in outpatient therapy at [facility] for several years. She reports grief and loss regarding having her cat euthanized on 12/08/2018 due to lung problems. She then lost her mother who died in 2019/06/20.  Patient last was seen via virtual visit about two weeks  ago.  She reports coping better with grief and loss issues regarding her deceased son.  She expresses increased acceptance and reports increased motivation.  She has been using daily planning to schedule and complete activities.  Per patient's report, this has improved her daily structure and increased involvement in activities.  She is completing more household task and interacting more with family.  She also reports this is helped her not to dwell on thoughts about her son.  She is planning to spend Mother's Day with her remaining son and daughter.  Suicidal/Homicidal: Nowithout  intent/plan  Therapist Response: Reviewed symptoms, praised and reinforced patient's use of daily planning, discussed effects, developed plan with patient to continue using daily planning, facilitated patient expressing thoughts and feelings about her son, assisted patient identify ways to cope with grief and loss issues on the upcoming Mother's Day holiday, encouraged patient to follow through with plan to spend time with her remaining children, also assisted patient identify possible memorializations to use to honor son and mother   Plan: Return again in 2 weeks   Diagnosis: Axis I: MDD, Recurrent, Moderate    Axis II: No diagnosis    [name], LCSW 10/04/2020

## 2020-10-18 ENCOUNTER — Ambulatory Visit (INDEPENDENT_AMBULATORY_CARE_PROVIDER_SITE_OTHER): Payer: Medicaid Other | Admitting: Psychiatry

## 2020-10-18 ENCOUNTER — Other Ambulatory Visit: Payer: Self-pay

## 2020-10-18 DIAGNOSIS — Z634 Disappearance and death of family member: Secondary | ICD-10-CM

## 2020-10-18 DIAGNOSIS — F330 Major depressive disorder, recurrent, mild: Secondary | ICD-10-CM

## 2020-10-18 NOTE — Progress Notes (Signed)
Virtual Visit via Video Note  I connected with Rebecca Barrera on 10/18/20 at 1:07 PM EDT  by a video enabled telemedicine application and verified that I am speaking with the correct person using two identifiers.  Location: Patient: Home Provider: [facility] [place] office    I discussed the limitations of evaluation and management by telemedicine and the availability of in person appointments. The patient expressed understanding and agreed to proceed.  I provided 43 minutes of non-face-to-face time during this encounter.   [name], LCSW   THERAPIST PROGRESS NOTE     Session Time: Thursday  10/18/2020  1:07 PM -1:50 PM   Participation Level: Active  Behavioral Response: CasualAlert/sad   Type of Therapy: Individual Therapy  Treatment Goals addressed: begin healthy grieving process  Interventions: Supportive, CBT  Summary: Rebecca Barrera is a 60 y.o. female who is referred for services by psychiatrist Dr. Vanetta Shawl due to patient experiencing symptoms of depression. She denies any psychiatric hospitalizations. She participated in outpatient therapy at [facility] for several years. She reports grief and loss regarding having her cat euthanized on 12/08/2018 due to lung problems. She then lost her mother who died in 06-16-19.  Patient last was seen via virtual visit about two weeks  ago.  She reports increased sadness and decreased involvement in activity as she has experienced increased thoughts about her deceased son.  She reports her remaining son and daughter brought dinner to her home and stayed with her for about 5 to 6 hours on Mother's Day.  She reports lighting candles in memory of her deceased son and her mother who also is deceased.  She reports sadness but also reports sharing a funny memories with her children about her son.  She is particularly sad and tearful today as she received a package of some of her son's belongings.  She patient reports she did ask for these  items earlier from her son's wife.  She reports patient not ready to open the package and states she will pull it away until she is better.  Patient reports she sometimes goes for 3 to 4 days without having crying spells about son.    Suicidal/Homicidal: Nowithout intent/plan  Therapist Response: Reviewed symptoms, praised and reinforced patient's efforts to use coping skills to manage Mother's Day, facilitated patient expressing thoughts and feelings about deceased son, validated and normalized feelings related to grief and loss, reviewed integrated grief and normalized going back and forth through the stages of grief, assisted patient identify ways to use her values to reinvest in life, discussed possible actions patient could pursue regarding her relationship with her daughter, patient is considering asking daughter to take her shopping, developed plan with patient to resume use of daily activity planning use of daily planning,   Plan: Return again in 2 weeks   Diagnosis: Axis I: MDD, Recurrent, Moderate    Axis II: No diagnosis    [name], LCSW 10/18/2020

## 2020-10-22 ENCOUNTER — Inpatient Hospital Stay (HOSPITAL_COMMUNITY): Admission: RE | Admit: 2020-10-22 | Payer: Medicaid Other | Source: Ambulatory Visit

## 2020-10-22 ENCOUNTER — Ambulatory Visit (HOSPITAL_COMMUNITY): Payer: Medicaid Other

## 2020-10-23 ENCOUNTER — Other Ambulatory Visit: Payer: Self-pay | Admitting: Psychiatry

## 2020-10-23 ENCOUNTER — Telehealth: Payer: Self-pay

## 2020-10-23 DIAGNOSIS — F330 Major depressive disorder, recurrent, mild: Secondary | ICD-10-CM

## 2020-10-23 MED ORDER — BUPROPION HCL ER (XL) 150 MG PO TB24: ORAL_TABLET | ORAL | 2 refills | Status: DC

## 2020-10-23 MED ORDER — BUPROPION HCL ER (XL) 300 MG PO TB24: 300.0000 mg | ORAL_TABLET | Freq: Every day | ORAL | 2 refills | Status: DC

## 2020-10-23 NOTE — Telephone Encounter (Signed)
spoke with the pharamacy and pt does need refills.

## 2020-10-23 NOTE — Telephone Encounter (Signed)
pt called states she needs refills on the wellutrin xl 300mg 

## 2020-10-23 NOTE — Telephone Encounter (Signed)
Ordered

## 2020-10-24 ENCOUNTER — Ambulatory Visit (HOSPITAL_COMMUNITY)
Admission: RE | Admit: 2020-10-24 | Discharge: 2020-10-24 | Disposition: A | Discharge status: Home / Self Care | Payer: Medicaid Other | Source: Ambulatory Visit | Attending: Internal Medicine | Admitting: Internal Medicine

## 2020-10-24 ENCOUNTER — Other Ambulatory Visit: Payer: Self-pay

## 2020-10-24 DIAGNOSIS — M8580 Other specified disorders of bone density and structure, unspecified site: Secondary | ICD-10-CM

## 2020-10-24 DIAGNOSIS — Z1231 Encounter for screening mammogram for malignant neoplasm of breast: Secondary | ICD-10-CM | POA: Insufficient documentation
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
## 2020-11-01 ENCOUNTER — Telehealth (INDEPENDENT_AMBULATORY_CARE_PROVIDER_SITE_OTHER): Payer: Medicaid Other | Admitting: Psychiatry

## 2020-11-01 ENCOUNTER — Encounter: Payer: Self-pay | Admitting: Psychiatry

## 2020-11-01 ENCOUNTER — Ambulatory Visit (INDEPENDENT_AMBULATORY_CARE_PROVIDER_SITE_OTHER): Payer: Medicaid Other | Admitting: Psychiatry

## 2020-11-01 ENCOUNTER — Other Ambulatory Visit: Payer: Self-pay

## 2020-11-01 DIAGNOSIS — F330 Major depressive disorder, recurrent, mild: Secondary | ICD-10-CM

## 2020-11-01 DIAGNOSIS — F411 Generalized anxiety disorder: Secondary | ICD-10-CM

## 2020-11-01 MED ORDER — CLONAZEPAM 1 MG PO TABS: 1.0000 mg | ORAL_TABLET | Freq: Three times a day (TID) | ORAL | 1 refills | Status: DC

## 2020-11-01 MED ORDER — VENLAFAXINE HCL ER 75 MG PO CP24: 75.0000 mg | ORAL_CAPSULE | Freq: Every day | ORAL | 3 refills | Status: DC

## 2020-11-01 NOTE — Patient Instructions (Signed)
1. Continue venlafaxine 75 mg daily  2.Continuebupropion450 mg daily 3. Discontinue nortriptyline 4.Continueclonazepam 1 mgthree times aday as needed for anxiety  5.Next appointment:7/28 at 11:30
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
## 2020-11-08 ENCOUNTER — Telehealth: Payer: Self-pay

## 2020-11-08 NOTE — Telephone Encounter (Signed)
pt left message that she wanted to be taken off the effexor slowly and put back on the zoloft

## 2020-11-08 NOTE — Telephone Encounter (Signed)
Will defer this to Dr.Hisada when she gets back in office Monday. But if she is having side effects please ask her to let me know and we could reduce the dose today.Please let me know.

## 2020-11-11 NOTE — Telephone Encounter (Signed)
I would advise to stay at the current dose of Effexor unless she has any side effect from the medication.

## 2020-11-15 ENCOUNTER — Ambulatory Visit (HOSPITAL_COMMUNITY): Payer: Medicaid Other | Admitting: Psychiatry

## 2020-11-15 ENCOUNTER — Other Ambulatory Visit: Payer: Self-pay

## 2020-11-15 NOTE — Progress Notes (Unsigned)
, °

## 2020-11-29 ENCOUNTER — Other Ambulatory Visit: Payer: Self-pay

## 2020-11-29 ENCOUNTER — Ambulatory Visit (INDEPENDENT_AMBULATORY_CARE_PROVIDER_SITE_OTHER): Payer: Medicaid Other | Admitting: Psychiatry

## 2020-11-29 DIAGNOSIS — F330 Major depressive disorder, recurrent, mild: Secondary | ICD-10-CM

## 2020-11-29 NOTE — Progress Notes (Signed)
Virtual Visit via Video Note  I connected with [name] on 11/29/20 at 1:10 PM EDT by a video enabled telemedicine application and verified that I am speaking with the correct person using two identifiers.  Location: Patient: Home Provider:  [facility] [place] office    I discussed the limitations of evaluation and management by telemedicine and the availability of in person appointments. The patient expressed understanding and agreed to proceed.    I provided 40 minutes of non-face-to-face time during this encounter.   [name], LCSW   THERAPIST PROGRESS NOTE     Session Time: Thursday 11/29/2020 1:10 PM  - 1:50 PM   Participation Level: Active  Behavioral Response: CasualAlert/tearful at times   Type of Therapy: Individual Therapy  Treatment Goals addressed: begin healthy grieving process  Interventions: Supportive, CBT  Summary: Rebecca Barrera is a 60 y.o. female who is referred for services by psychiatrist Dr. Vanetta Shawl due to patient experiencing symptoms of depression. She denies any psychiatric hospitalizations. She participated in outpatient therapy at [facility] for several years. She reports grief and loss regarding having her cat euthanized on 12/08/2018 due to lung problems. She then lost her mother who died in 07/07/2019.  Patient last was seen via virtual visit about 3-4 weeks  ago.  She states doing okay since last session.  She continues to experience sadness about the death of her son but reports decreased crying spells.  She reports experiencing 1-2 episodes per week.  She is pleased she has become more aware of her triggers and has been using helpful coping strategies to manage.  She reports continued support from her children.  She is pleased with her efforts to reengage more with her children.  Per her report, she and her daughter celebrated their birthdays at patient's home earlier this month.  She also plans to go out to dinner with her children and  her son this coming weekend.  She expresses some guilt about starting to reengage more in life.    Suicidal/Homicidal: Nowithout intent/plan  Therapist Response: Reviewed symptoms, praised and reinforced patient's use of helpful coping strategies, praised and reinforced patient's efforts to increase involvement with her children, discussed effects, assisted patient identify and verbalize feelings of guilt, normalized feelings associated with grief and loss, assisted patient challenge and replace thoughts evoking inappropriate guilt, encourage patient to follow through with plan to go out to dinner with her children, began providing psychoeducation on the four tasks of mourning, assisted patient identify her experience with 2 of the tasks, praised and reinforced patient's efforts regarding these tasks, began to discuss task 3 (adjusting to a world without the deceased) developed plan with patient to read handout and to write her experiences with the 3 areas of adjustment and what she would like to accomplish in each area in preparation for next session    Plan: Return again in 2 weeks   Diagnosis: Axis I: MDD, Recurrent, Moderate    Axis II: No diagnosis    [name], LCSW 11/29/2020    ,

## 2020-12-12 ENCOUNTER — Ambulatory Visit (HOSPITAL_COMMUNITY): Payer: Medicaid Other | Admitting: Psychiatry
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
## 2020-12-27 ENCOUNTER — Encounter: Payer: Self-pay | Admitting: Psychiatry

## 2020-12-27 ENCOUNTER — Telehealth (INDEPENDENT_AMBULATORY_CARE_PROVIDER_SITE_OTHER): Payer: Medicaid Other | Admitting: Psychiatry

## 2020-12-27 ENCOUNTER — Ambulatory Visit (INDEPENDENT_AMBULATORY_CARE_PROVIDER_SITE_OTHER): Payer: Medicaid Other | Admitting: Psychiatry

## 2020-12-27 ENCOUNTER — Other Ambulatory Visit: Payer: Self-pay

## 2020-12-27 DIAGNOSIS — F330 Major depressive disorder, recurrent, mild: Secondary | ICD-10-CM

## 2020-12-27 DIAGNOSIS — F411 Generalized anxiety disorder: Secondary | ICD-10-CM | POA: Diagnosis not present

## 2020-12-27 MED ORDER — BUPROPION HCL ER (XL) 300 MG PO TB24: 300.0000 mg | ORAL_TABLET | Freq: Every day | ORAL | 2 refills | Status: DC

## 2020-12-27 MED ORDER — BUPROPION HCL ER (XL) 150 MG PO TB24: ORAL_TABLET | ORAL | 2 refills | Status: DC

## 2020-12-27 MED ORDER — CLONAZEPAM 1 MG PO TABS: 1.0000 mg | ORAL_TABLET | Freq: Three times a day (TID) | ORAL | 2 refills | Status: DC

## 2020-12-27 NOTE — Progress Notes (Signed)
Virtual Visit via Video Note  I connected with [name] on 12/27/20 at 4:05 PM EDT by a video enabled telemedicine application and verified that I am speaking with the correct person using two identifiers.  Location: Patient:  Home Provider: [facility] [place] office    I discussed the limitations of evaluation and management by telemedicine and the availability of in person appointments. The patient expressed understanding and agreed to proceed.   I provided 42 minutes of non-face-to-face time during this encounter.   [name], LCSW   THERAPIST PROGRESS NOTE     Session Time: Thursday 12/27/2020 4:05 PM - 4:47 PM   Participation Level: Active  Behavioral Response: CasualAlert/tearful at times   Type of Therapy: Individual Therapy  Treatment Goals addressed: begin healthy grieving process  Interventions: Supportive, CBT  Summary: Rebecca Barrera is a 60 y.o. female who is referred for services by psychiatrist Dr. Vanetta Shawl due to patient experiencing symptoms of depression. She denies any psychiatric hospitalizations. She participated in outpatient therapy at [facility] for several years. She reports grief and loss regarding having her cat euthanized on 12/08/2018 due to lung problems. She then lost her mother who died in 06-22-2019.  Patient last was seen via virtual visit about 3-4 weeks  ago.  She reports stable mood and continuing to do better since last session.  She denies any depressive periods.  She continues to miss son but no longer becomes overwhelmed when discussing her son.  She no longer expresses guilt about moving on with her life and states knowing her son would want her to move on.  She reports decreased crying spells, increased motivation, and increased energy.  She reports increased contact with her daughter and says they talk on the phone at least 3 times per day.  They have been unable to do activities together as much as they would like due to  daughter's work schedule.  Patient continues to take care of daily household tasks as well as take care of her cat.  Patient is pleased with her progress in treatment and expresses confidence in her ability to use helpful coping strategies    Suicidal/Homicidal: Nowithout intent/plan  Therapist Response: Reviewed symptoms, praised and reinforced patient's increased behavioral activation and involvement with her children, encouraged patient to maintain efforts, discussed third and fourth task of morning, discussed patient's experience with each, discussed patient's progress in treatment, processed patient's feelings about stepdown plan for termination to include 2 more sessions, developed plan with patient to review handout on early warning signs of depression in preparation for next session  Plan: Return again in 2 weeks   Diagnosis: Axis I: MDD, Recurrent, Moderate    Axis II: No diagnosis    [name], LCSW 12/27/2020    ,

## 2020-12-27 NOTE — Patient Instructions (Signed)
1. Continue venlafaxine 75 mg daily  2. Continue bupropion 450 mg daily  3. Continue clonazepam 1 mg three times a day as needed for anxiety   4. Next appointment: 10/25 at 11 AM

## 2021-01-02 ENCOUNTER — Other Ambulatory Visit (HOSPITAL_COMMUNITY): Payer: Self-pay | Admitting: Internal Medicine

## 2021-01-02 DIAGNOSIS — M542 Cervicalgia: Secondary | ICD-10-CM

## 2021-01-10 ENCOUNTER — Ambulatory Visit (HOSPITAL_COMMUNITY): Payer: Medicaid Other | Admitting: Psychiatry

## 2021-01-10 ENCOUNTER — Other Ambulatory Visit: Payer: Self-pay

## 2021-01-15 ENCOUNTER — Telehealth: Payer: Self-pay

## 2021-01-15 NOTE — Telephone Encounter (Signed)
pt called left message that she needed something else for her depression she states that her depression has been bad. she didn't know if you would increase medications or send something in to add or change

## 2021-01-15 NOTE — Telephone Encounter (Signed)
I would not make any medication change at this time. Advise her to have sooner appointment. I can see her next Mon morning in available slot if it works for her. Please also advise her to continue to see [name].
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
## 2021-01-17 ENCOUNTER — Other Ambulatory Visit: Payer: Self-pay

## 2021-01-17 ENCOUNTER — Encounter: Payer: Self-pay | Admitting: Psychiatry

## 2021-01-17 ENCOUNTER — Telehealth (INDEPENDENT_AMBULATORY_CARE_PROVIDER_SITE_OTHER): Payer: Medicaid Other | Admitting: Psychiatry

## 2021-01-17 DIAGNOSIS — F411 Generalized anxiety disorder: Secondary | ICD-10-CM | POA: Diagnosis not present

## 2021-01-17 DIAGNOSIS — F330 Major depressive disorder, recurrent, mild: Secondary | ICD-10-CM

## 2021-01-17 NOTE — Patient Instructions (Addendum)
1. Continue venlafaxine 75 mg daily  2. Continue bupropion 450 mg daily  3. Continue clonazepam 1 mg three times a day as needed for anxiety   4. Next appointment: 10/4 at 3:30

## 2021-01-24 ENCOUNTER — Ambulatory Visit (INDEPENDENT_AMBULATORY_CARE_PROVIDER_SITE_OTHER): Payer: Medicaid Other | Admitting: Psychiatry

## 2021-01-24 ENCOUNTER — Other Ambulatory Visit: Payer: Self-pay

## 2021-01-24 DIAGNOSIS — F330 Major depressive disorder, recurrent, mild: Secondary | ICD-10-CM | POA: Diagnosis not present

## 2021-01-24 NOTE — Progress Notes (Signed)
Virtual Visit via Video Note  I connected with Rebecca Barrera on 01/24/21 at 1:22 PM EDT  by a video enabled telemedicine application and verified that I am speaking with the correct person using two identifiers.  Location: Patient: Home Provider: [facility]  office    I discussed the limitations of evaluation and management by telemedicine and the availability of in person appointments. The patient expressed understanding and agreed to proceed.  I provided 33 minutes of non-face-to-face time during this encounter.   [name], LCSW  THERAPIST PROGRESS NOTE     Session Time: Thursday 01/24/2021 1:22 PM - 1:55 PM   Participation Level: Active  Behavioral Response: CasualAlert/tearful at times   Type of Therapy: Individual Therapy  Treatment Goals addressed: begin healthy grieving process  Interventions: Supportive, CBT  Summary: Rebecca Barrera is a 60 y.o. female who is referred for services by psychiatrist Dr. Vanetta Shawl due to patient experiencing symptoms of depression. She denies any psychiatric hospitalizations. She participated in outpatient therapy at [facility] for several years. She reports grief and loss regarding having her cat euthanized on 12/08/2018 due to lung problems. She then lost her mother who died in June 28, 2019.  Patient last was seen via virtual visit about 3-4 weeks  ago.  She reports increased stress regarding several issues since last session but coping well.  Per patient's report, she initially became depressed but then started using self talk as well as engagement in activities to overcome.  Has resumed her interest in taking care of daily household task as well as taking care of her cat.  She reports increased involvement and socialization with her neighbors.  She reports she has been enjoying going outside and enjoying the sun.  Patient reports feeling more confident and more motivated.     Suicidal/Homicidal: Nowithout intent/plan  Therapist  Response: Reviewed symptoms, assisted patient identify triggers of increased symptoms of depression, praised and reinforced patient's helpful coping strategies to manage, discussed effects, provided psychoeducation on lapse versus relapse of depression, assisted patient identify early warning signs of depression and strategies to intervene, discussed patient's progress in treatment, agreed next session will be termination session, therapist will send patient mental health maintenance plan in preparation for next session  Plan: Return again in 2 weeks   Diagnosis: Axis I: MDD, Recurrent, Moderate    Axis II: No diagnosis    [name], LCSW 01/24/2021    ,

## 2021-01-25 ENCOUNTER — Other Ambulatory Visit (HOSPITAL_COMMUNITY): Payer: Self-pay | Admitting: Internal Medicine

## 2021-01-25 ENCOUNTER — Ambulatory Visit (HOSPITAL_COMMUNITY)
Admission: RE | Admit: 2021-01-25 | Discharge: 2021-01-25 | Disposition: A | Discharge status: Home / Self Care | Payer: Medicaid Other | Source: Ambulatory Visit | Attending: Internal Medicine | Admitting: Internal Medicine

## 2021-01-25 ENCOUNTER — Other Ambulatory Visit: Payer: Self-pay

## 2021-01-25 DIAGNOSIS — M26622 Arthralgia of left temporomandibular joint: Secondary | ICD-10-CM

## 2021-01-25 DIAGNOSIS — M542 Cervicalgia: Secondary | ICD-10-CM | POA: Diagnosis not present

## 2021-01-31 ENCOUNTER — Ambulatory Visit (INDEPENDENT_AMBULATORY_CARE_PROVIDER_SITE_OTHER): Payer: Medicaid Other | Admitting: Psychiatry

## 2021-01-31 ENCOUNTER — Other Ambulatory Visit: Payer: Self-pay

## 2021-01-31 ENCOUNTER — Telehealth (HOSPITAL_COMMUNITY): Payer: Self-pay | Admitting: *Deleted

## 2021-01-31 ENCOUNTER — Telehealth: Payer: Self-pay

## 2021-01-31 DIAGNOSIS — F330 Major depressive disorder, recurrent, mild: Secondary | ICD-10-CM | POA: Diagnosis not present

## 2021-01-31 NOTE — Telephone Encounter (Signed)
Medication problems - Patient left 2 messages she would like to discuss an increase or a change in her medications for depression with Dr. Vanetta Shawl.  Pt crying more and does not think current regimen is working as well.  Pt. questioned if she could return to taking Cymbalta and to "slowly go off one of my others".  Patient with reported increased depression and anxiousness.
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
## 2021-01-31 NOTE — Telephone Encounter (Signed)
Discussed with the patient. She states that something has occurred; the person who she thought was helping her was not. She denies SI.  Provided psychoeducation not to switch back to duloxetine as she was not necessarily doing well when she was on that medication.  Discussed with the patient that the current mood symptoms are likely more situational, although we can consider adding another medication if she is interested.  She asks if trazodone could be prescribed.  She denies any issues with insomnia.  This clinician informed her that this medication would not be good for her/would cause drowsiness if she does not have any issues with sleep.  She verbalized her understanding, and agrees to continue using skills which she has learned from [name].   - no medication change at this time
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
## 2021-02-14 ENCOUNTER — Ambulatory Visit (INDEPENDENT_AMBULATORY_CARE_PROVIDER_SITE_OTHER): Payer: Medicaid Other | Admitting: Psychiatry

## 2021-02-14 ENCOUNTER — Other Ambulatory Visit: Payer: Self-pay

## 2021-02-14 DIAGNOSIS — F330 Major depressive disorder, recurrent, mild: Secondary | ICD-10-CM | POA: Diagnosis not present

## 2021-02-14 NOTE — Progress Notes (Signed)
Virtual Visit via Video Note  I connected with Rebecca Barrera on 02/14/21 at 1:10 PM EDT  by a video enabled telemedicine application and verified that I am speaking with the correct person using two identifiers.  Location: Patient: Home Provider: [facility] [place] office    I discussed the limitations of evaluation and management by telemedicine and the availability of in person appointments. The patient expressed understanding and agreed to proceed.  I provided 47 minutes of non-face-to-face time during this encounter.   [name], LCSW  THERAPIST PROGRESS NOTE     Session Time: Thursday 02/14/2021 1:10 PM - 1:57 PM   Participation Level: Active  Behavioral Response: CasualAlert/anxious  Type of Therapy: Individual Therapy  Treatment Goals addressed: begin healthy grieving process  Interventions: Supportive, CBT  Summary: Rebecca Barrera is a 60 y.o. female who is referred for services by psychiatrist Dr. Vanetta Shawl due to patient experiencing symptoms of depression. She denies any psychiatric hospitalizations. She participated in outpatient therapy at [facility] for several years. She reports grief and loss regarding having her cat euthanized on 12/08/2018 due to lung problems. She then lost her mother who died in 2019/06/27.  Patient last was seen via virtual visit about 2 weeks ago.  She reports coping well since last session.  She continues to experience sadness from time to time regarding her deceased son but is using healthy coping strategies consistently and successfully.  She denies being overwhelmed.  She reports decreased stress regarding her daughter's father as she has continued to set and maintain limits regarding contact.  Patient maintains involvement in activities and socialization.  Continues to have support from her children.  She is very pleased with her progress in treatment and expresses confidence in her ability to successfully continue using helpful coping  strategies.    Suicidal/Homicidal: Nowithout intent/plan  Therapist Response: Reviewed symptoms, discussed stressors, facilitated expression of thoughts and feelings, validated feelings, praised and reinforced patient's efforts to set/maintain limits regarding contact with her daughter's father, assisted patient develop mental health maintenance plan, determination, encouraged patient to contact this practice should she need psychotherapy services in the future. Patient will continue to see psychiatrist Dr. Vanetta Shawl for medication management.     Plan: Return again in 2 weeks   Diagnosis: Axis I: MDD, Recurrent, Moderate    Axis II: No diagnosis    [name], LCSW 02/14/2021     Outpatient Therapist Discharge Summary  Rebecca Barrera    [birth year]   Admission Date: 12/27/2018 Discharge Date:  02/14/2021 Reason for Discharge:  Goals accomplished Diagnosis:  Axis I:  MDD (major depressive disorder), recurrent episode, mild (HCC)   Comments: Patient will continue to see psychiatrist Dr. Vanetta Shawl for medication management.  She is encouraged to contact this practice should she need psychotherapy services in the future.  Montavius Subramaniam E Danaya Geddis LCSW   ,
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
## 2021-03-05 ENCOUNTER — Encounter: Payer: Self-pay | Admitting: Psychiatry

## 2021-03-05 ENCOUNTER — Telehealth (INDEPENDENT_AMBULATORY_CARE_PROVIDER_SITE_OTHER): Payer: Medicaid Other | Admitting: Psychiatry

## 2021-03-05 ENCOUNTER — Other Ambulatory Visit: Payer: Self-pay

## 2021-03-05 DIAGNOSIS — F331 Major depressive disorder, recurrent, moderate: Secondary | ICD-10-CM | POA: Diagnosis not present

## 2021-03-05 MED ORDER — VENLAFAXINE HCL ER 37.5 MG PO CP24: 37.5000 mg | ORAL_CAPSULE | Freq: Every day | ORAL | 0 refills | Status: DC

## 2021-03-05 MED ORDER — SERTRALINE HCL 50 MG PO TABS: ORAL_TABLET | ORAL | 1 refills | Status: DC

## 2021-03-05 NOTE — Patient Instructions (Signed)
Start sertraline 25 mg daily for one week then 50 mg daily   Decrease venlafaxine 37.5 mg daily for one week, then discontinue 2. Continue bupropion 450 mg daily  3. Continue clonazepam 1 mg three times a day as needed for anxiety   4. Next appointment: 11/17 at 2 PM

## 2021-03-08 ENCOUNTER — Telehealth: Payer: Self-pay

## 2021-03-08 NOTE — Telephone Encounter (Signed)
It may be coming off from Effexor. I would advise that she stays on the same regimen as advised at this time to see if her body gets used to medication change. Please advise her to contact if any worsening in symptoms.

## 2021-03-08 NOTE — Telephone Encounter (Signed)
pt called she states that you changed her medication and she now on zolfot and you decressed her effexor.  she states that she just doesn't feel right and she didn't know if it was the medication changes. she just feel weird .  like on edge. jumpy

## 2021-03-18 ENCOUNTER — Other Ambulatory Visit: Payer: Self-pay

## 2021-03-18 ENCOUNTER — Telehealth (INDEPENDENT_AMBULATORY_CARE_PROVIDER_SITE_OTHER): Payer: Medicaid Other | Admitting: Psychiatry

## 2021-03-18 ENCOUNTER — Encounter: Payer: Self-pay | Admitting: Psychiatry

## 2021-03-18 DIAGNOSIS — F331 Major depressive disorder, recurrent, moderate: Secondary | ICD-10-CM

## 2021-03-18 MED ORDER — VENLAFAXINE HCL ER 37.5 MG PO CP24: 37.5000 mg | ORAL_CAPSULE | Freq: Every day | ORAL | 0 refills | Status: DC
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
## 2021-03-18 NOTE — Patient Instructions (Signed)
Start venlafaxine 37.5 mg daily  Discontinue sertraline Continue bupropion 450 mg daily  Continue clonazepam 1 mg three times a day as needed for anxiety  4. Next appointment: 11/17 at 2 PM

## 2021-03-20 ENCOUNTER — Telehealth (INDEPENDENT_AMBULATORY_CARE_PROVIDER_SITE_OTHER): Payer: Medicaid Other | Admitting: Psychiatry

## 2021-03-20 ENCOUNTER — Other Ambulatory Visit: Payer: Self-pay

## 2021-03-20 DIAGNOSIS — F331 Major depressive disorder, recurrent, moderate: Secondary | ICD-10-CM | POA: Diagnosis not present

## 2021-03-20 NOTE — Progress Notes (Signed)
Virtual Visit via Video Note  I connected with Rebecca Barrera on 03/20/21 at 2:10 PM EDT  by a video enabled telemedicine application and verified that I am speaking with the correct person using two identifiers.  Location: Patient: Home Provider: [facility] [place] office    I discussed the limitations of evaluation and management by telemedicine and the availability of in person appointments. The patient expressed understanding and agreed to proceed.   I provided 45 minutes of non-face-to-face time during this encounter.   [name], LCSW    THERAPIST PROGRESS NOTE     Session Time: Wednesday 03/20/2021 2:10 PM -  2:55 PM   Participation Level: Active  Behavioral Response: CasualAlert/anxious  Type of Therapy: Individual Therapy  Treatment Goals addressed: Learn and  implement cognitive and behavioral strategies to overcome depression and address interpersonal issues  Interventions: Supportive, CBT  Summary: Rebecca Barrera is a 60 y.o. female who is referred for services by psychiatrist Dr. Vanetta Shawl due to patient experiencing symptoms of depression. She denies any psychiatric hospitalizations. She participated in outpatient therapy at [facility] for several years. She reports grief and loss regarding having her cat euthanized on 12/08/2018 due to lung problems. She then lost her mother who died in 06-15-19.  Patient last was seen via virtual visit about 6 to 7 weeks ago.  She reports experiencing increased symptoms of depression triggered by medication change that now has been addressed.  Per patient's report, she experience significant withdrawal symptoms from Effexor and had a very difficult time this past weekend.  The first anniversary of her son's death was this past 09/18/2022 and this also contributed to increased grief and loss issues.  Patient reports having difficulty using coping strategies due to withdrawal symptoms from her medication.  She has resumed medication  per psychiatrist Dr. Bing Matter instructions and is starting to feel better.  However, she expresses worry and sadness about upcoming holidays and ways to interact with her 2 remaining children.  Per her report, they are not as supportive as they were.  Per patient's report, they expressed thoughts of patient only caring about their brother.  Patient reports she is trying to resume normal interest and involvement in activities around her home and apartment complex.     Suicidal/Homicidal: Nowithout intent/plan  Therapist Response: Reviewed symptoms, discussed stressors, facilitated expression of thoughts and feelings, validated feelings, normalized feelings related to grief and loss issues, reviewed integrated grief and feelings associated with acute grief, encouraged medication compliance, reviewed the role of behavioral activation in coping and overcoming depression, encourage patient to continue efforts, began to identify targets for treatment, will discuss more at next session.   Plan: Return again in 2 weeks   Diagnosis: Axis I: MDD, Recurrent, Moderate    Axis II: No diagnosis    [name], LCSW 03/20/2021   ,

## 2021-03-26 ENCOUNTER — Telehealth: Payer: Medicaid Other | Admitting: Psychiatry

## 2021-04-08 ENCOUNTER — Ambulatory Visit (HOSPITAL_COMMUNITY): Payer: Medicaid Other | Admitting: Psychiatry

## 2021-04-11 ENCOUNTER — Telehealth: Payer: Self-pay

## 2021-04-11 DIAGNOSIS — F331 Major depressive disorder, recurrent, moderate: Secondary | ICD-10-CM

## 2021-04-11 MED ORDER — VENLAFAXINE HCL ER 37.5 MG PO CP24: 37.5000 mg | ORAL_CAPSULE | Freq: Every day | ORAL | 0 refills | Status: DC

## 2021-04-11 NOTE — Telephone Encounter (Signed)
pt called she will need a refill on the effexor xr

## 2021-04-11 NOTE — Telephone Encounter (Signed)
I have sent Effexor to pharmacy. 
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
## 2021-04-18 ENCOUNTER — Telehealth: Payer: Medicaid Other | Admitting: Psychiatry

## 2021-04-18 ENCOUNTER — Encounter: Payer: Self-pay | Admitting: Psychiatry

## 2021-04-18 ENCOUNTER — Telehealth (INDEPENDENT_AMBULATORY_CARE_PROVIDER_SITE_OTHER): Payer: Medicaid Other | Admitting: Psychiatry

## 2021-04-18 ENCOUNTER — Other Ambulatory Visit: Payer: Self-pay

## 2021-04-18 DIAGNOSIS — F330 Major depressive disorder, recurrent, mild: Secondary | ICD-10-CM | POA: Diagnosis not present

## 2021-04-18 DIAGNOSIS — F331 Major depressive disorder, recurrent, moderate: Secondary | ICD-10-CM | POA: Diagnosis not present

## 2021-04-18 DIAGNOSIS — F411 Generalized anxiety disorder: Secondary | ICD-10-CM | POA: Diagnosis not present

## 2021-04-18 MED ORDER — CLONAZEPAM 1 MG PO TABS: 1.0000 mg | ORAL_TABLET | Freq: Three times a day (TID) | ORAL | 2 refills | Status: DC

## 2021-04-18 MED ORDER — VENLAFAXINE HCL ER 37.5 MG PO CP24: 37.5000 mg | ORAL_CAPSULE | Freq: Every day | ORAL | 0 refills | Status: DC

## 2021-04-18 MED ORDER — BUPROPION HCL ER (XL) 150 MG PO TB24: 150.0000 mg | ORAL_TABLET | Freq: Every day | ORAL | 2 refills | Status: DC

## 2021-04-18 MED ORDER — BUPROPION HCL ER (XL) 300 MG PO TB24: 300.0000 mg | ORAL_TABLET | Freq: Every day | ORAL | 2 refills | Status: DC

## 2021-04-18 NOTE — Patient Instructions (Signed)
Continue venlafaxine 37.5 mg daily  Continue bupropion 450 mg daily  Continue clonazepam 1 mg three times a day as needed for anxiety  4. Next appointment:  12/15 at 4 PM

## 2021-04-23 ENCOUNTER — Other Ambulatory Visit: Payer: Self-pay

## 2021-04-23 ENCOUNTER — Ambulatory Visit (INDEPENDENT_AMBULATORY_CARE_PROVIDER_SITE_OTHER): Payer: Medicaid Other | Admitting: Psychiatry

## 2021-04-23 DIAGNOSIS — F331 Major depressive disorder, recurrent, moderate: Secondary | ICD-10-CM

## 2021-04-23 NOTE — Progress Notes (Signed)
Virtual Visit via Telephone Note  I connected with Rebecca Barrera on 04/23/21 at 2:12 PM EST  by telephone and verified that I am speaking with the correct person using two identifiers.  Location: Patient: Home Provider: [facility] [place] office    I discussed the limitations, risks, security and privacy concerns of performing an evaluation and management service by telephone and the availability of in person appointments. I also discussed with the patient that there may be a patient responsible charge related to this service. The patient expressed understanding and agreed to proceed.   I provided 48 minutes of non-face-to-face time during this encounter.   [name], LCSW     THERAPIST PROGRESS NOTE     Session Time: Tuesday 04/23/2021 2:12 PM - 3:00 PM   Participation Level: Active  Behavioral Response: CasualAlert/sadness  Type of Therapy: Individual Therapy  Treatment Goals addressed: Learn and  implement cognitive and behavioral strategies to overcome depression and address interpersonal issues  Interventions: Supportive, CBT  Summary: Rebecca Barrera is a 60 y.o. female who is referred for services by psychiatrist Dr. Vanetta Shawl due to patient experiencing symptoms of depression. She denies any psychiatric hospitalizations. She participated in outpatient therapy at [facility] for several years. She reports grief and loss regarding having her cat euthanized on 12/08/2018 due to lung problems. She then lost her mother who died in 06/17/2019.  Patient last was seen via virtual visit about 4 weeks ago.  She reports increased memories of son and sadness triggered by the upcoming holidays but is not overwhelmed.  She reports coping with grief and loss issues fairly well and expresses continued acceptance.  However, she expresses frustration regarding her 2 remaining children who are no longer speaking to her.  Patient reports initially being very upset about this but then becoming  assertive and setting/maintaining limits with her children.  She expresses sadness about her children not being with her for Thanksgiving but expresses acceptance of this.  She plans to still celebrate the holiday and is looking forward to preparing a special meal at home for self.  She reports resuming normal interest and involvement in activities since last session.  She has developed new friendships online with females who live in her local area and reports this has been very rewarding.   Suicidal/Homicidal: Nowithout intent/plan  Therapist Response: Reviewed symptoms, normalized feelings related to grief and loss issues, discussed stressors, facilitated expression of thoughts and feelings, validated feelings, praised and reinforced patient's use of assertiveness skills, praised and reinforced patient's efforts to plan for the holidays, assisted patient identify coping statements, encouraged patient to maintain connection with friends and involvement in behavioral activation   Plan: Return again in 2 - 3  weeks   Diagnosis: Axis I: MDD, Recurrent, Moderate    Axis II: No diagnosis    [name], LCSW 04/23/2021   ,
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
## 2021-05-16 ENCOUNTER — Telehealth (INDEPENDENT_AMBULATORY_CARE_PROVIDER_SITE_OTHER): Payer: Medicaid Other | Admitting: Psychiatry

## 2021-05-16 ENCOUNTER — Encounter: Payer: Self-pay | Admitting: Psychiatry

## 2021-05-16 ENCOUNTER — Other Ambulatory Visit: Payer: Self-pay

## 2021-05-16 DIAGNOSIS — F330 Major depressive disorder, recurrent, mild: Secondary | ICD-10-CM

## 2021-05-16 DIAGNOSIS — F411 Generalized anxiety disorder: Secondary | ICD-10-CM

## 2021-05-16 DIAGNOSIS — F4381 Prolonged grief disorder: Secondary | ICD-10-CM | POA: Diagnosis not present

## 2021-05-16 MED ORDER — VENLAFAXINE HCL ER 37.5 MG PO CP24: 37.5000 mg | ORAL_CAPSULE | Freq: Every day | ORAL | 1 refills | Status: DC

## 2021-05-16 NOTE — Patient Instructions (Signed)
Continue venlafaxine 37.5 mg daily  Continue bupropion 450 mg daily  Continue clonazepam 1 mg three times a day as needed for anxiety   4. Next appointment:  2/9 at 4 PM, video

## 2021-05-21 ENCOUNTER — Other Ambulatory Visit: Payer: Self-pay

## 2021-05-21 ENCOUNTER — Ambulatory Visit (INDEPENDENT_AMBULATORY_CARE_PROVIDER_SITE_OTHER): Payer: Medicaid Other | Admitting: Psychiatry

## 2021-05-21 DIAGNOSIS — F330 Major depressive disorder, recurrent, mild: Secondary | ICD-10-CM | POA: Diagnosis not present

## 2021-05-21 NOTE — Progress Notes (Signed)
Virtual Visit via Telephone Note  I connected with [name] on 05/21/21 at 1:10 PM EST by telephone and verified that I am speaking with the correct person using two identifiers.  Location: Patient: Home Provider: [facility] [place] office    I discussed the limitations, risks, security and privacy concerns of performing an evaluation and management service by telephone and the availability of in person appointments. I also discussed with the patient that there may be a patient responsible charge related to this service. The patient expressed understanding and agreed to proceed.   I provided 43 minutes of non-face-to-face time during this encounter.   [name], LCSW  THERAPIST PROGRESS NOTE     Session Time: Tuesday 05/21/2021 1:10 PM - 1:53 PM   Participation Level: Active  Behavioral Response: CasualAlert/sadness  Type of Therapy: Individual Therapy  Treatment Goals addressed: Learn and  implement cognitive and behavioral strategies to overcome depression and address interpersonal issues  Interventions: Supportive, CBT  Summary: Rebecca Barrera is a 60 y.o. female who is referred for services by psychiatrist Dr. Vanetta Shawl due to patient experiencing symptoms of depression. She denies any psychiatric hospitalizations. She participated in outpatient therapy at [facility] for several years. She reports grief and loss regarding having her cat euthanized on 12/08/2018 due to lung problems. She then lost her mother who died in 06-25-2019.  Patient last was seen via virtual visit about 3- 4 weeks ago.  She reports managing Thanksgiving Holiday well. She missed her deceased son, mother, and cat but reports celebrating the day by preparing meal for self and reminiscing about good memories. She continues to do something similar for Christmas. She has decorated her home and has a Christmas tree per her report. She continues to express sadness and hurt regarding her two remaining  children blocking her from their phones and FaceBook.  However, she expresses continued acceptance of this and denies being overwhelmed by their actions. She maintains contact with online friends and involvement in daily activities.   Suicidal/Homicidal: Nowithout intent/plan  Therapist Response: Reviewed symptoms, normalized feelings related to grief and loss issues, discussed stressors, facilitated expression of thoughts and feelings, validated feelings, praised and reinforced plans to celebrate Christmas, discussed coping with grief during holidays and reviewed integrated grief, assisted patient identify coping statements, encouraged patient to maintain connection with friends and involvement in behavioral activation   Plan: Return again in 2 - 3  weeks   Diagnosis: Axis I: MDD, Recurrent, Moderate    Axis II: No diagnosis    Kashana Breach E Jocie Meroney, LCSW   ,

## 2021-06-11 ENCOUNTER — Other Ambulatory Visit: Payer: Self-pay

## 2021-06-11 ENCOUNTER — Ambulatory Visit (INDEPENDENT_AMBULATORY_CARE_PROVIDER_SITE_OTHER): Payer: Medicaid Other | Admitting: Psychiatry

## 2021-06-11 DIAGNOSIS — F330 Major depressive disorder, recurrent, mild: Secondary | ICD-10-CM

## 2021-06-11 NOTE — Progress Notes (Signed)
Virtual Visit via Video Note  I connected with Rebecca Barrera on 06/11/21 at 3:15 PM EST  by a video enabled telemedicine application and verified that I am speaking with the correct person using two identifiers.  Location: Patient: Home Provider: [facility] [place] office    I discussed the limitations of evaluation and management by telemedicine and the availability of in person appointments. The patient expressed understanding and agreed to proceed.  I provided 55 minutes of non-face-to-face time during this encounter.   [name], LCSW  THERAPIST PROGRESS NOTE     Session Time: Tuesday 06/11/2021 3:15 PM -  4:10 PM   Participation Level: Active  Behavioral Response: CasualAlert/sadness  Type of Therapy: Individual Therapy  Treatment Goals addressed: Learn and  implement cognitive and behavioral strategies to overcome depression and address interpersonal issues  Interventions: Supportive, CBT  Summary: Rebecca Barrera is a 61 y.o. female who is referred for services by psychiatrist Dr. Vanetta Shawl due to patient experiencing symptoms of depression. She denies any psychiatric hospitalizations. She participated in outpatient therapy at [facility] for several years. She reports grief and loss regarding having her cat euthanized on 12/08/2018 due to lung problems. She then lost her mother who died in 07-01-2019.  Patient last was seen via virtual visit about 3- 4 weeks ago.  She reports increased depressed mood and grief and loss issues triggered by the holidays.  She reports becoming tearful but forcing herself to participate in activities.  She reports now beginning to resume normal interest in activities.  She has reconnected with her daughter after initiating a text.  She reports now realizing and appreciating she still has a son and daughter rather than being focused on her other son not being here.   Suicidal/Homicidal: Nowithout intent/plan  Therapist Response: Reviewed  symptoms, facilitated expression of thoughts and feelings, normalized feelings related to grief and loss issues, reviewed integrated grief, reviewed relapse of depression versus relapse of depression, praised and reinforced patient's recognition of lapse and efforts to intervene to avoid relapse, praised and reinforced patient's efforts to reconnect with her children, reviewed the task of mourning particularly internal adjustments relating to her role/identity, assisted patient identify ways to fulfill her role as a mother to her remaining children and ways to nurture that relationship according to her values, discussed finding a healthy balance between cherishing her deceased son's memory and moving forward in life, developed plan with patient to resume use of planning to increase and maintain consistent behavioral activation,   Plan: Return again in 3  weeks   Diagnosis: Axis I: MDD, Recurrent, Moderate    Axis II: No diagnosis    Shritha Bresee E Harla Mensch, LCSW   ,

## 2021-06-15 ENCOUNTER — Other Ambulatory Visit: Payer: Self-pay | Admitting: Psychiatry

## 2021-06-15 DIAGNOSIS — F411 Generalized anxiety disorder: Secondary | ICD-10-CM

## 2021-06-15 DIAGNOSIS — F330 Major depressive disorder, recurrent, mild: Secondary | ICD-10-CM

## 2021-07-02 ENCOUNTER — Other Ambulatory Visit (HOSPITAL_COMMUNITY): Payer: Self-pay | Admitting: Internal Medicine

## 2021-07-02 ENCOUNTER — Encounter (HOSPITAL_COMMUNITY): Payer: Self-pay

## 2021-07-02 ENCOUNTER — Other Ambulatory Visit: Payer: Self-pay

## 2021-07-02 ENCOUNTER — Ambulatory Visit (HOSPITAL_COMMUNITY): Payer: Medicaid Other | Admitting: Psychiatry

## 2021-07-02 DIAGNOSIS — R1032 Left lower quadrant pain: Secondary | ICD-10-CM
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
## 2021-07-11 ENCOUNTER — Encounter: Payer: Self-pay | Admitting: Psychiatry

## 2021-07-11 ENCOUNTER — Telehealth (INDEPENDENT_AMBULATORY_CARE_PROVIDER_SITE_OTHER): Payer: Medicaid Other | Admitting: Psychiatry

## 2021-07-11 ENCOUNTER — Other Ambulatory Visit: Payer: Self-pay

## 2021-07-11 DIAGNOSIS — F411 Generalized anxiety disorder: Secondary | ICD-10-CM | POA: Diagnosis not present

## 2021-07-11 DIAGNOSIS — F3341 Major depressive disorder, recurrent, in partial remission: Secondary | ICD-10-CM | POA: Diagnosis not present

## 2021-07-11 DIAGNOSIS — F4381 Prolonged grief disorder: Secondary | ICD-10-CM

## 2021-07-11 MED ORDER — BUPROPION HCL ER (XL) 300 MG PO TB24: 300.0000 mg | ORAL_TABLET | Freq: Every day | ORAL | 4 refills | Status: DC

## 2021-07-11 MED ORDER — BUPROPION HCL ER (XL) 150 MG PO TB24: 150.0000 mg | ORAL_TABLET | Freq: Every day | ORAL | 1 refills | Status: DC

## 2021-07-11 MED ORDER — VENLAFAXINE HCL ER 37.5 MG PO CP24: 37.5000 mg | ORAL_CAPSULE | Freq: Every day | ORAL | 1 refills | Status: DC

## 2021-07-11 MED ORDER — CLONAZEPAM 1 MG PO TABS: 1.0000 mg | ORAL_TABLET | Freq: Three times a day (TID) | ORAL | 1 refills | Status: DC

## 2021-07-11 NOTE — Patient Instructions (Signed)
Continue venlafaxine 37.5 mg daily Continue bupropion 450 mg daily  Continue clonazepam 1 mg three times a day as needed for anxiety  4. Next appointment:  4/11 at 4 PM

## 2021-07-18 ENCOUNTER — Encounter (HOSPITAL_COMMUNITY): Payer: Self-pay

## 2021-07-18 ENCOUNTER — Ambulatory Visit (HOSPITAL_COMMUNITY): Payer: Medicaid Other | Admitting: Psychiatry

## 2021-08-13 ENCOUNTER — Ambulatory Visit (INDEPENDENT_AMBULATORY_CARE_PROVIDER_SITE_OTHER): Payer: Medicaid Other | Admitting: Psychiatry

## 2021-08-13 ENCOUNTER — Other Ambulatory Visit: Payer: Self-pay

## 2021-08-13 DIAGNOSIS — F3341 Major depressive disorder, recurrent, in partial remission: Secondary | ICD-10-CM

## 2021-08-13 NOTE — Progress Notes (Signed)
Virtual Visit via Video Note ? ?I connected with CHANTAVIA BAZZLE on 08/13/21 at 1:05 PM EDT by a video enabled telemedicine application and verified that I am speaking with the correct person using two identifiers. ? ?Location: ?Patient: Home ?Provider: [facility] [place] office  ?  ?I discussed the limitations of evaluation and management by telemedicine and the availability of in person appointments. The patient expressed understanding and agreed to proceed. ? ?I provided 45 minutes of non-face-to-face time during this encounter. ? ? ?Merrick Feutz E Keonte Daubenspeck, LCSW ?      THERAPIST PROGRESS NOTE ? ? ? ? ?Session Time: Tuesday 08/13/2021 1:05 PM - 1:50 PM  ? ?Participation Level: Active ? ?Behavioral Response: CasualAlert/sadness, anger  ? ?Type of Therapy: Individual Therapy ? ?Treatment Goals addressed: Learn and  implement cognitive and behavioral strategies to overcome depression and address interpersonal issues ? ?Progress on Goals: Progressing  ? ?Interventions: Supportive, CBT ? ?Summary: BRYNLIE DAZA is a 61 y.o. female who is referred for services by psychiatrist Dr. Modesta Messing due to patient experiencing symptoms of depression. She denies any psychiatric hospitalizations. She participated in outpatient therapy at [facility] for several years. She reports grief and loss regarding having her cat euthanized on 12/08/2018 due to lung problems. She then lost her mother who died in June 12, 2019. ? ?Patient last was seen via virtual visit about 8-9 weeks ago.  She reports doing very well the first 4 to 5 weeks.  Then, she began to experience increased thoughts and sadness about her deceased son.  Initially, she could not identify a trigger.  Eventually she shares learning negative information about a man she met online.  Per her report, she had been talking to this man for about a month when she learned he had not been forthcoming about his history.  She reports receiving a call from him 1 day informing her he was going to jail  because he had violated his probation.  Patient reports later learning he had not been truthful with her about other areas of his life as well.  She reports all of this happened just prior to her beginning to have increased thoughts about her son.  Patient expresses anger and disappointment about the situation with this man as she reports they had talked daily and also speculated about a future together.   ? ?suicidal/Homicidal: Nowithout intent/plan ? ?Therapist Response: Reviewed symptoms, assisted patient identify triggers of increased depressed mood, discussed stressors, facilitated expression of thoughts and feelings, validated feelings, discussed the connection between loss of dreams and hopes about recent relationship and the loss of her son, praised and reinforced patient's recognition of early warning signs of depression, assisted patient identify strategies to avoid relapse, developed plan with patient to resume use of daily planning in helpful activities, developed plan with patient to bring daily planning forms next session,  ? ?Plan: Return again in 2  weeks  ? ?Diagnosis: Axis I: MDD, Recurrent, Moderate ? ?  Axis II: No diagnosis ? ?Collaboration of Care: Psychiatrist AEB patient working with psychiatrist Dr. Modesta Messing, clinician reviewing chart. ? ?Patient/Guardian was advised Release of Information must be obtained prior to any record release in order to collaborate their care with an outside provider. Patient/Guardian was advised if they have not already done so to contact the registration department to sign all necessary forms in order for [place] to release information regarding their care.  ? ?Consent: Patient/Guardian gives verbal consent for treatment and assignment of benefits for services provided during  this visit. Patient/Guardian expressed understanding and agreed to proceed.  ? ?Danelle Curiale E Lewi Drost, LCSW ? ? ?, ? ? ? ?

## 2021-09-03 ENCOUNTER — Ambulatory Visit (HOSPITAL_COMMUNITY): Payer: Medicaid Other | Admitting: Psychiatry

## 2021-09-10 ENCOUNTER — Telehealth (INDEPENDENT_AMBULATORY_CARE_PROVIDER_SITE_OTHER): Payer: Medicaid Other | Admitting: Psychiatry

## 2021-09-10 ENCOUNTER — Encounter: Payer: Self-pay | Admitting: Psychiatry

## 2021-09-10 DIAGNOSIS — F411 Generalized anxiety disorder: Secondary | ICD-10-CM | POA: Diagnosis not present

## 2021-09-10 DIAGNOSIS — F331 Major depressive disorder, recurrent, moderate: Secondary | ICD-10-CM

## 2021-09-10 DIAGNOSIS — F4381 Prolonged grief disorder: Secondary | ICD-10-CM

## 2021-09-10 MED ORDER — CLONAZEPAM 1 MG PO TABS: 1.0000 mg | ORAL_TABLET | Freq: Three times a day (TID) | ORAL | 1 refills | Status: DC

## 2021-09-10 MED ORDER — BUPROPION HCL ER (XL) 150 MG PO TB24: 150.0000 mg | ORAL_TABLET | Freq: Every day | ORAL | 5 refills | Status: DC

## 2021-09-10 MED ORDER — VENLAFAXINE HCL ER 37.5 MG PO CP24: 37.5000 mg | ORAL_CAPSULE | Freq: Every day | ORAL | 2 refills | Status: DC
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
## 2021-09-10 NOTE — Patient Instructions (Signed)
Continue venlafaxine 37.5 mg daily  ?Continue bupropion 450 mg daily  ?Continue clonazepam 1 mg three times a day as needed for anxiety  ?4. Next appointment:  5/18 at 4:30, video ?

## 2021-09-16 ENCOUNTER — Ambulatory Visit (HOSPITAL_COMMUNITY)
Admission: RE | Admit: 2021-09-16 | Discharge: 2021-09-16 | Disposition: A | Discharge status: Home / Self Care | Payer: Medicaid Other | Source: Ambulatory Visit | Attending: Internal Medicine | Admitting: Internal Medicine

## 2021-09-16 DIAGNOSIS — R1032 Left lower quadrant pain: Secondary | ICD-10-CM

## 2021-09-17 ENCOUNTER — Ambulatory Visit (HOSPITAL_COMMUNITY): Payer: Medicaid Other | Admitting: Psychiatry

## 2021-09-17 ENCOUNTER — Telehealth (HOSPITAL_COMMUNITY): Payer: Self-pay | Admitting: Psychiatry

## 2021-09-17 NOTE — Telephone Encounter (Signed)
Therapist contacted patient via text through caregility platform for scheduled appointment.  Patient reports being very sick.  Patient and therapist agreed to cancel appointment and reschedule. ?

## 2021-10-17 ENCOUNTER — Telehealth: Payer: Medicaid Other | Admitting: Psychiatry

## 2021-10-18 ENCOUNTER — Telehealth (INDEPENDENT_AMBULATORY_CARE_PROVIDER_SITE_OTHER): Payer: Medicaid Other | Admitting: Psychiatry

## 2021-10-18 ENCOUNTER — Encounter: Payer: Self-pay | Admitting: Psychiatry

## 2021-10-18 DIAGNOSIS — F411 Generalized anxiety disorder: Secondary | ICD-10-CM

## 2021-10-18 DIAGNOSIS — F4381 Prolonged grief disorder: Secondary | ICD-10-CM | POA: Diagnosis not present

## 2021-10-18 DIAGNOSIS — F330 Major depressive disorder, recurrent, mild: Secondary | ICD-10-CM | POA: Diagnosis not present

## 2021-10-18 MED ORDER — CLONAZEPAM 1 MG PO TABS: 1.0000 mg | ORAL_TABLET | Freq: Three times a day (TID) | ORAL | 0 refills | Status: DC

## 2021-10-18 NOTE — Patient Instructions (Signed)
Continue venlafaxine 37.5 mg daily  Continue bupropion 450 mg daily  Continue clonazepam 1 mg three times a day as needed for anxiety  4. Next appointment: 6/28 at 4 PM
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
## 2021-11-27 ENCOUNTER — Encounter: Payer: Self-pay | Admitting: Psychiatry

## 2021-11-27 ENCOUNTER — Telehealth (INDEPENDENT_AMBULATORY_CARE_PROVIDER_SITE_OTHER): Payer: Medicaid Other | Admitting: Psychiatry

## 2021-11-27 DIAGNOSIS — F4381 Prolonged grief disorder: Secondary | ICD-10-CM | POA: Diagnosis not present

## 2021-11-27 DIAGNOSIS — F331 Major depressive disorder, recurrent, moderate: Secondary | ICD-10-CM | POA: Diagnosis not present

## 2021-11-27 DIAGNOSIS — F411 Generalized anxiety disorder: Secondary | ICD-10-CM

## 2021-11-27 MED ORDER — CLONAZEPAM 1 MG PO TABS: 1.0000 mg | ORAL_TABLET | Freq: Three times a day (TID) | ORAL | 0 refills | Status: DC

## 2021-11-27 MED ORDER — BUPROPION HCL ER (XL) 300 MG PO TB24: 300.0000 mg | ORAL_TABLET | Freq: Every day | ORAL | 5 refills | Status: DC

## 2021-11-27 MED ORDER — VENLAFAXINE HCL ER 37.5 MG PO CP24: 37.5000 mg | ORAL_CAPSULE | Freq: Every day | ORAL | 5 refills | Status: DC

## 2021-11-27 NOTE — Patient Instructions (Signed)
Continue venlafaxine 37.5 mg daily  Continue bupropion 450 mg daily  Continue clonazepam 1 mg three times a day as needed for anxiety  4. Next appointment: 8/9 at 3:30

## 2021-12-12 ENCOUNTER — Ambulatory Visit (INDEPENDENT_AMBULATORY_CARE_PROVIDER_SITE_OTHER): Payer: Medicaid Other | Admitting: Psychiatry

## 2021-12-12 DIAGNOSIS — F331 Major depressive disorder, recurrent, moderate: Secondary | ICD-10-CM
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
## 2021-12-17 ENCOUNTER — Telehealth (HOSPITAL_COMMUNITY): Payer: Self-pay

## 2021-12-17 ENCOUNTER — Telehealth (HOSPITAL_COMMUNITY): Payer: Self-pay | Admitting: Psychiatry

## 2021-12-17 NOTE — Telephone Encounter (Signed)
Pt states she is having a really hard time as she has had another death in the family. She was extremely tearful as we spoke. She wanted an earlier appointment, however none are available except 10 am on 12/18/21. Pt has a medical appointment at that time and cannot make that. She asks to let Gigi Gin know about her situation and requests a phone call.

## 2021-12-17 NOTE — Telephone Encounter (Signed)
Therapist returned call to patient.  Per patient's report, her daughter's father died yesterday.  Patient reports increased grief and loss issues as well as increased thoughts about her deceased son.  Patient denies any SI.  She reports she and her daughter are supportive to each other.  Therapist expressed condolences and again offered patient an earlier appointment tomorrow but patient has schedule conflict.  Therapist normalized patient's feelings related to grief and loss issues, encouraged patient to use her support system, and review previously mailed material. Patient has appointment with psychiatrist Dr. Vanetta Shawl in August.  Therapist encouraged patient to contact psychiatrist earlier if needed.  Patient agrees to keep scheduled appointment with therapist on 12/26/2021.
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
## 2021-12-25 ENCOUNTER — Telehealth (INDEPENDENT_AMBULATORY_CARE_PROVIDER_SITE_OTHER): Payer: Medicaid Other | Admitting: Psychiatry

## 2021-12-25 ENCOUNTER — Encounter: Payer: Self-pay | Admitting: Psychiatry

## 2021-12-25 DIAGNOSIS — F4381 Prolonged grief disorder: Secondary | ICD-10-CM | POA: Diagnosis not present

## 2021-12-25 DIAGNOSIS — F331 Major depressive disorder, recurrent, moderate: Secondary | ICD-10-CM | POA: Diagnosis not present

## 2021-12-25 DIAGNOSIS — F411 Generalized anxiety disorder: Secondary | ICD-10-CM | POA: Diagnosis not present

## 2021-12-26 ENCOUNTER — Encounter (HOSPITAL_COMMUNITY): Payer: Self-pay

## 2021-12-26 ENCOUNTER — Ambulatory Visit (INDEPENDENT_AMBULATORY_CARE_PROVIDER_SITE_OTHER): Payer: Medicaid Other | Admitting: Psychiatry

## 2021-12-26 DIAGNOSIS — F331 Major depressive disorder, recurrent, moderate: Secondary | ICD-10-CM

## 2021-12-26 NOTE — Plan of Care (Signed)
  Problem: Depression CCP Problem depressed mood, crying spells, anger Goal:  " I want to be able to get through this (losing my ex-husband) move on, find peace, want my life back" AEB pt resuming normal interest/pleasure/involvement in activties 2x per week for 60 days per pt's report" Outcome: Initial Goal: LTG: Rebecca Barrera WILL SCORE LESS THAN 10 ON THE PATIENT HEALTH QUESTIONNAIRE (PHQ-9) Outcome: Initial Goal: STG: Reduce overall depression score by a minimum of 25% on the Patient Health Questionnaire (PHQ-9) or the Montgomery-Asberg Depression Rating Scale (MADRS) Outcome: Initial Goal: STG: Rebecca Barrera WILL PRACTICE BEHAVIORAL ACTIVATION SKILLS 2-3 TIMES PER WEEK FOR THE NEXT 12 WEEKS Outcome: Initial

## 2021-12-26 NOTE — Progress Notes (Signed)
Virtual Visit via Video Note  I connected with Rebecca Barrera on 12/26/21 at 11:15 AM EDT  by a video enabled telemedicine application and verified that I am speaking with the correct person using two identifiers.  Location: Patient:  Home Provider: [facility] [place] office    I discussed the limitations of evaluation and management by telemedicine and the availability of in person appointments. The patient expressed understanding and agreed to proceed.  I provided 53 minutes of non-face-to-face time during this encounter.   [name], LCSW              THERAPIST PROGRESS NOTE     Session Time: Thursday 12/26/2021 11:15 AM - 12:08 PM   Participation Level: Active  Behavioral Response: CasualAlert/sadness, anger/tearfulness  Type of Therapy: Individual Therapy  Treatment Goals addressed:    I want to be able to get through this (losing my ex-husband) move on, find peace, want my life back" AEB pt resuming normal interest/pleasure/involvement in activties 2x per week for 60 days per pt's report"Rebecca Barrera WILL PRACTICE BEHAVIORAL ACTIVATION SKILLS 2-3 TIMES PER WEEK FOR THE NEXT 12 WEEKS      Progress on Goals: Initial   Interventions: Supportive, CBT  Summary: Rebecca Barrera is a 61 y.o. female who initially is referred for services by psychiatrist Dr. Vanetta Shawl due to patient experiencing symptoms of depression. She denies any psychiatric hospitalizations. She participated in outpatient therapy at [facility] for several years. She is a returning patient to this clinician and has been seen intermittently for 2 to 3 years.  Patient reports increased grief and loss issues triggered by the upcoming birthday of her deceased son who died in 2020-03-21.  His birthday is [birth year] 30.  Current symptoms include crying spells, sadness, and tearfulness.  Patient last was seen via virtual visit 2 weeks ago for the assessment appointment.  She reports different grief and loss issues as her  daughter's father died on December 31, 2022.  Although patient  and this man were no longer together, he was still the love of her life per patient's report.  They had a 10 to 12-year relationship before they separated.  They reconnected in the past year and made peace with each other per patient's report.  She reports she last talked to him about a months ago.  Patient reports increased crying spells, depressed mood, and loss of interest/involvement in activities.   suicidal/Homicidal: Nowithout intent/plan  Therapist Response: Reviewed symptoms, facilitated patient expressing thoughts and feelings about grief and loss issues, validated and normalized feelings related to grief, facilitated patient sharing narrative of her daughters father's death, developed treatment plan, obtained patient's permission to electronically sign plan for patient as this was a virtual visit, developed plan with patient to begin using grief deck that was previously mailed to patient  Plan: Return again in 2  weeks   Diagnosis: Axis I: MDD, Recurrent, Moderate    Axis II: No diagnosis  Collaboration of Care: Psychiatrist AEB patient working with psychiatrist Dr. Vanetta Shawl, clinician reviewing chart.  Patient/Guardian was advised Release of Information must be obtained prior to any record release in order to collaborate their care with an outside provider. Patient/Guardian was advised if they have not already done so to contact the registration department to sign all necessary forms in order for [place] to release information regarding their care.   Consent: Patient/Guardian gives verbal consent for treatment and assignment of benefits for services provided during this visit. Patient/Guardian expressed understanding and agreed  to proceed.   Bonne Whack E Amarisa Wilinski, LCSW   ,
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
## 2022-01-08 ENCOUNTER — Encounter: Payer: Self-pay | Admitting: Psychiatry

## 2022-01-08 ENCOUNTER — Telehealth (INDEPENDENT_AMBULATORY_CARE_PROVIDER_SITE_OTHER): Payer: Medicaid Other | Admitting: Psychiatry

## 2022-01-08 DIAGNOSIS — F4381 Prolonged grief disorder: Secondary | ICD-10-CM | POA: Diagnosis not present

## 2022-01-08 DIAGNOSIS — F411 Generalized anxiety disorder: Secondary | ICD-10-CM | POA: Diagnosis not present

## 2022-01-08 DIAGNOSIS — F331 Major depressive disorder, recurrent, moderate: Secondary | ICD-10-CM

## 2022-01-08 MED ORDER — CLONAZEPAM 1 MG PO TABS: 1.0000 mg | ORAL_TABLET | Freq: Three times a day (TID) | ORAL | 1 refills | Status: DC

## 2022-01-08 NOTE — Patient Instructions (Signed)
Continue venlafaxine 37.5 mg daily  Continue bupropion 450 mg daily  Continue clonazepam 1 mg three times a day as needed for anxiety   4. Next appointment: 9/13 at 4'30

## 2022-01-13 ENCOUNTER — Telehealth (HOSPITAL_COMMUNITY): Payer: Medicaid Other | Admitting: Psychiatry

## 2022-01-27 ENCOUNTER — Encounter (HOSPITAL_COMMUNITY): Payer: Self-pay

## 2022-01-27 ENCOUNTER — Ambulatory Visit (HOSPITAL_COMMUNITY): Payer: Medicaid Other | Admitting: Psychiatry

## 2022-01-28 ENCOUNTER — Telehealth (INDEPENDENT_AMBULATORY_CARE_PROVIDER_SITE_OTHER): Payer: Medicaid Other | Admitting: Psychiatry

## 2022-01-28 ENCOUNTER — Encounter: Payer: Self-pay | Admitting: Psychiatry

## 2022-01-28 DIAGNOSIS — F331 Major depressive disorder, recurrent, moderate: Secondary | ICD-10-CM

## 2022-01-28 DIAGNOSIS — F4381 Prolonged grief disorder: Secondary | ICD-10-CM | POA: Diagnosis not present

## 2022-01-28 MED ORDER — VENLAFAXINE HCL ER 75 MG PO CP24: 75.0000 mg | ORAL_CAPSULE | Freq: Every day | ORAL | 0 refills | Status: DC
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
## 2022-02-10 ENCOUNTER — Ambulatory Visit (INDEPENDENT_AMBULATORY_CARE_PROVIDER_SITE_OTHER): Payer: Medicaid Other | Admitting: Psychiatry

## 2022-02-10 DIAGNOSIS — F331 Major depressive disorder, recurrent, moderate: Secondary | ICD-10-CM

## 2022-02-10 NOTE — Progress Notes (Signed)
Virtual Visit via Video Note  I connected with MAURICA OMURA on 02/10/22 at 1:12 PM EDT  by a video enabled telemedicine application and verified that I am speaking with the correct person using two identifiers.  Location: Patient: Home Provider: [facility] [place] office    I discussed the limitations of evaluation and management by telemedicine and the availability of in person appointments. The patient expressed understanding and agreed to proceed.   I provided 45 minutes of non-face-to-face time during this encounter.   [name], LCSW               THERAPIST PROGRESS NOTE     Session Time: Monday  02/10/2022 1:12 PM - 1:57 PM   Participation Level: Active  Behavioral Response: CasualAlert/sadness,/tearfulness  Type of Therapy: Individual Therapy  Treatment Goals addressed:    I want to be able to get through this (losing my ex-husband) move on, find peace, want my life back" AEB pt resuming normal interest/pleasure/involvement in activties 2x per week for 60 days per pt's report"Shelie WILL PRACTICE BEHAVIORAL ACTIVATION SKILLS 2-3 TIMES PER WEEK FOR THE NEXT 12 WEEKS      Progress on Goals: Initial   Interventions: Supportive, CBT  Summary: DELORESE SELLIN is a 61 y.o. female who initially is referred for services by psychiatrist Dr. Vanetta Shawl due to patient experiencing symptoms of depression. She denies any psychiatric hospitalizations. She participated in outpatient therapy at [facility] for several years. She is a returning patient to this clinician and has been seen intermittently for 2 to 3 years.  Patient reports increased grief and loss issues triggered by the upcoming birthday of her deceased son who died in 03-28-20.  His birthday is July 30.  Current symptoms include crying spells, sadness, and tearfulness.  Patient last was seen via virtual visit 5-6 weeks ago.  for the assessment appointment.  She reports continued grief and loss issues.  She expresses  frustration as she reports sometimes feeling as though she was right back where she was when her son died.  Her husband's death has triggered increased memories of son.  However, she is beginning to resume interest in some daily activities. She also has maintained regular contact with her daughter.  They meet at least once every 2 weeks and have movie night.  Patient reports she has been enjoying this and says that even have been able to laugh.  They are already making plans to be with each other for the holidays.  Patient reports the increased dosage of Effexor seems to be helpful.  Although she still has crying spells and remains tearful, she reports no longer crying daily.  Patient reports she has been using the grief deck but has not needed to use it as frequently as she has in the past.  suicidal/Homicidal: Nowithout intent/plan  Therapist Response: Reviewed symptoms, praised and reinforced patient's medication compliance/increased behavioral activation, discussed effects, developed plan with patient to continue efforts with the use of daily planning, praised and reinforced patient's consistent contact with daughter, discussed ways to continue to nurture that relationship, facilitated patient expressing thoughts and feelings about grief and loss issues, validated and normalized feelings related to grief,   Plan: Return again in 2  weeks   Diagnosis: Axis I: MDD, Recurrent, Moderate    Axis II: No diagnosis  Collaboration of Care: Psychiatrist AEB patient working with psychiatrist Dr. Vanetta Shawl, clinician reviewing chart.  Patient/Guardian was advised Release of Information must be obtained prior to any  record release in order to collaborate their care with an outside provider. Patient/Guardian was advised if they have not already done so to contact the registration department to sign all necessary forms in order for [place] to release information regarding their care.   Consent: Patient/Guardian gives  verbal consent for treatment and assignment of benefits for services provided during this visit. Patient/Guardian expressed understanding and agreed to proceed.   Drae Mitzel E Neville Walston, LCSW   ,
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
## 2022-02-12 ENCOUNTER — Telehealth: Payer: Self-pay | Admitting: Psychiatry

## 2022-02-12 ENCOUNTER — Telehealth: Payer: Medicaid Other | Admitting: Psychiatry

## 2022-02-12 NOTE — Telephone Encounter (Signed)
Sent link for video visit through Epic. Patient did not sign in. Called the patient for appointment scheduled today. The patient did not answer the phone. Left voice message to contact the office ([phone]).   ?

## 2022-02-18 ENCOUNTER — Telehealth: Payer: Self-pay

## 2022-02-18 DIAGNOSIS — F331 Major depressive disorder, recurrent, moderate: Secondary | ICD-10-CM

## 2022-02-18 MED ORDER — BUPROPION HCL ER (XL) 150 MG PO TB24: 150.0000 mg | ORAL_TABLET | Freq: Every day | ORAL | 0 refills | Status: DC

## 2022-02-18 MED ORDER — VENLAFAXINE HCL ER 75 MG PO CP24
75.0000 mg | ORAL_CAPSULE | Freq: Every day | ORAL | 0 refills | Status: DC
Start: 2022-02-18 — End: 2022-03-21

## 2022-02-18 NOTE — Telephone Encounter (Signed)
  pt called states she is not going to have enought of the venlafaxine xr75mg  or the wellbutrin 150mg       Disp Refills Start End   venlafaxine XR (EFFEXOR-XR) 75 MG 24 hr capsule 30 capsule 0 01/28/2022 02/27/2022   Sig - Route: Take 1 capsule (75 mg total) by mouth daily with breakfast. - Oral   Sent to pharmacy as: venlafaxine XR (EFFEXOR-XR) 75 MG 24 hr capsule   Notes to Pharmacy: Dose uptitrated   E-Prescribing Status: Receipt confirmed by pharmacy (01/28/2022  2:48 PM EDT)     Disp Refills Start End   buPROPion (WELLBUTRIN XL) 150 MG 24 hr tablet 30 tablet 5 09/17/2021 03/16/2022   Sig - Route: Take 1 tablet (150 mg total) by mouth daily. Total of 450 mg daily. Take along with 300 mg tab - Oral   Sent to pharmacy as: buPROPion (WELLBUTRIN XL) 150 MG 24 hr tablet   E-Prescribing Status: Receipt confirmed by pharmacy (09/10/2021  4:37 PM EDT)

## 2022-02-18 NOTE — Telephone Encounter (Signed)
I have sent venlafaxine 75 mg and Wellbutrin 150 mg to pharmacy.  Will route to Dr. Modesta Messing to address once she is back.

## 2022-02-27 ENCOUNTER — Ambulatory Visit (INDEPENDENT_AMBULATORY_CARE_PROVIDER_SITE_OTHER): Payer: Medicaid Other | Admitting: Psychiatry

## 2022-02-27 DIAGNOSIS — F331 Major depressive disorder, recurrent, moderate: Secondary | ICD-10-CM | POA: Diagnosis not present

## 2022-02-27 NOTE — Progress Notes (Signed)
Virtual Visit via Video Note  I connected with Rebecca Barrera on 02/27/22 at 2:12 PM EDT  by a video enabled telemedicine application and verified that I am speaking with the correct person using two identifiers.  Location: Patient: Home Provider: [facility] Out[facility] [place] office    I discussed the limitations of evaluation and management by telemedicine and the availability of in person appointments. The patient expressed understanding and agreed to proceed.   I provided 51 minutes of non-face-to-face time during this encounter.   [name], LCSW               THERAPIST PROGRESS NOTE     Session Time: Thursday  9/282023 2:12 PM -  3:03 PM   Participation Level: Active  Behavioral Response: CasualAlert/sadness,/tearfulness  Type of Therapy: Individual Therapy  Treatment Goals addressed:    I want to be able to get through this (losing my ex-husband) move on, find peace, want my life back" AEB pt resuming normal interest/pleasure/involvement in activties 2x per week for 60 days per pt's report"Lukisha WILL PRACTICE BEHAVIORAL ACTIVATION SKILLS 2-3 TIMES PER WEEK FOR THE NEXT 12 WEEKS      Progress on Goals: Initial   Interventions: Supportive, CBT  Summary: Rebecca Barrera is a 61 y.o. female who initially is referred for services by psychiatrist Dr. Vanetta Shawl due to patient experiencing symptoms of depression. She denies any psychiatric hospitalizations. She participated in outpatient therapy at [facility] for several years. She is a returning patient to this clinician and has been seen intermittently for 2 to 3 years.  Patient reports increased grief and loss issues triggered by the upcoming birthday of her deceased son who died in 03-29-2020.  His birthday is [birth year] 30.  Current symptoms include crying spells, sadness, and tearfulness.  Patient last was seen via virtual visit   She reports continued grief and loss issues.  She reports continued increased memories of her son and  states dreading the upcoming second anniversary of his death on 31-Mar-2023.  She is particularly emotional and distraught today.  She denies having daily crying spells but reports more frequent crying spells since last session.  She expresses anger and confusion regarding son dying by suicide. She continues to report feelings of abandonment.  She also verbalizes anger regarding not being included in making arrangements regarding his funeral.patient reports she is still trying to maintain involvement in activities.   suicidal/Homicidal: Nowithout intent/plan  Therapist Response: Reviewed symptoms, facilitated patient expressing thoughts and feelings related to the death of her son, validated and normalized feelings, assisted patient verbalized feelings of anger and explore underlying feelings, assisted patient practicing grounding technique to regain composure as she recalls some of the details of son's death, developed plan with patient to practice grounding technique when feeling overwhelmed between sessions, also encouraged patient to maintain involvement in activities   Plan: Return again in 2  weeks   Diagnosis: Axis I: MDD, Recurrent, Moderate    Axis II: No diagnosis  Collaboration of Care: Psychiatrist AEB patient working with psychiatrist Dr. Vanetta Shawl, clinician reviewing chart.  Patient/Guardian was advised Release of Information must be obtained prior to any record release in order to collaborate their care with an outside provider. Patient/Guardian was advised if they have not already done so to contact the registration department to sign all necessary forms in order for [place] to release information regarding their care.   Consent: Patient/Guardian gives verbal consent for treatment and assignment of benefits for services  provided during this visit. Patient/Guardian expressed understanding and agreed to proceed.   Rebecca Speas E Vu Liebman, LCSW   ,

## 2022-03-13 ENCOUNTER — Ambulatory Visit (INDEPENDENT_AMBULATORY_CARE_PROVIDER_SITE_OTHER): Payer: Medicaid Other | Admitting: Psychiatry

## 2022-03-13 DIAGNOSIS — F331 Major depressive disorder, recurrent, moderate: Secondary | ICD-10-CM

## 2022-03-13 NOTE — Progress Notes (Signed)
Virtual Visit via Video Note  I connected with Rebecca Barrera on 03/13/22 at 2:10 PM EDT  by a video enabled telemedicine application and verified that I am speaking with the correct person using two identifiers.  Location: Patient: Home Provider: [place] office    I discussed the limitations of evaluation and management by telemedicine and the availability of in person appointments. The patient expressed understanding and agreed to proceed.   I provided 44 minutes of non-face-to-face time during this encounter.   Alonza Smoker, LCSW               THERAPIST PROGRESS NOTE     Session Time: Thursday  03/13/2022 2:10 PM - 2:54 PM   Participation Level: Active  Behavioral Response: CasualAlert/sadness,/tearfulness  Type of Therapy: Individual Therapy  Treatment Goals addressed:    I want to be able to get through this (losing my ex-husband) move on, find peace, want my life back" AEB pt resuming normal interest/pleasure/involvement in activties 2x per week for 60 days per pt's report"Rebecca Barrera WILL PRACTICE BEHAVIORAL ACTIVATION SKILLS 2-3 TIMES PER WEEK FOR THE NEXT 12 WEEKS      Progress on Goals: Initial   Interventions: Supportive, CBT  Summary: Rebecca Barrera is a 61 y.o. female who initially is referred for services by psychiatrist Dr. Modesta Messing due to patient experiencing symptoms of depression. She denies any psychiatric hospitalizations. She participated in outpatient therapy at [facility] for several years. She is a returning patient to this clinician and has been seen intermittently for 2 to 3 years.  Patient reports increased grief and loss issues triggered by the upcoming birthday of her deceased son who died in 2020/03/12.  His birthday is July 30.  Current symptoms include crying spells, sadness, and tearfulness.  Patient last was seen via virtual visit about 2 to 3 weeks ago.  She reports continued grief and loss issues including increased thoughts about  deceased son, sadness, and tearfulness.  She reports decreased thoughts about deceased ex-husband.  She has continued efforts to remain involved in daily activities such as household tasks, taking care of her cat, and going outside to take the trash.  Patient reports she has intentionally taken the long route to the dumpster as well as stood on her balcony to try to enjoy nature and the moment.  She also reports recently contacting her deceased son's wife with home she has not talked in 2 years.  Per patient's report, the conversation was very comforting and patient reports no longer experiencing anger.  Patient reports dreading the second anniversary of her son's death which is March 24, 2023.  She fears how she may react.  She also reports fears of losing someone else in her family.    suicidal/Homicidal: Nowithout intent/plan  Therapist Response: Reviewed symptoms, facilitated patient expressing thoughts and feelings related to grief and loss issues, normalized feelings related to acute grief as a part of integrated grief, praised and reinforced patient's efforts to remain involved in daily activities, discussed the effects of her involvement on her mood/thoughts/behavior, encouraged patient to maintain consistent efforts, facilitated patient expressing thoughts and feelings about recent conversation with deceased son's wife, validated feelings, discussed approaching and acknowledging rather than avoiding thoughts and feelings on the anniversary of son's death, assisted patient identify ways to cope with the upcoming anniversary of her son"s death, developed plan with patient to possibly plan a phone conversation with his deceased son's wife to share positive memories,   Plan:  Return again in 2  weeks   Diagnosis: Axis I: MDD, Recurrent, Moderate    Axis II: No diagnosis  Collaboration of Care: Psychiatrist AEB patient working with psychiatrist Dr. Modesta Messing, clinician reviewing  chart.  Patient/Guardian was advised Release of Information must be obtained prior to any record release in order to collaborate their care with an outside provider. Patient/Guardian was advised if they have not already done so to contact the registration department to sign all necessary forms in order for [place] to release information regarding their care.   Consent: Patient/Guardian gives verbal consent for treatment and assignment of benefits for services provided during this visit. Patient/Guardian expressed understanding and agreed to proceed.   Rebecca Barrera E Rebecca Zobrist, LCSW   ,
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
## 2022-03-21 ENCOUNTER — Telehealth (INDEPENDENT_AMBULATORY_CARE_PROVIDER_SITE_OTHER): Payer: Medicaid Other | Admitting: Psychiatry

## 2022-03-21 ENCOUNTER — Encounter: Payer: Self-pay | Admitting: Psychiatry

## 2022-03-21 DIAGNOSIS — F331 Major depressive disorder, recurrent, moderate: Secondary | ICD-10-CM

## 2022-03-21 DIAGNOSIS — F411 Generalized anxiety disorder: Secondary | ICD-10-CM | POA: Diagnosis not present

## 2022-03-21 DIAGNOSIS — F4381 Prolonged grief disorder: Secondary | ICD-10-CM

## 2022-03-21 MED ORDER — VENLAFAXINE HCL ER 37.5 MG PO CP24: 112.5000 mg | ORAL_CAPSULE | Freq: Every day | ORAL | 1 refills | Status: DC

## 2022-03-21 MED ORDER — BUPROPION HCL ER (XL) 150 MG PO TB24
150.0000 mg | ORAL_TABLET | Freq: Every day | ORAL | 5 refills | Status: DC
Start: 2022-03-21 — End: 2022-09-17

## 2022-03-21 MED ORDER — CLONAZEPAM 1 MG PO TABS: 1.0000 mg | ORAL_TABLET | Freq: Three times a day (TID) | ORAL | 1 refills | Status: DC

## 2022-03-21 NOTE — Patient Instructions (Signed)
Increase venlafaxine 75 mg daily Continue bupropion 450 mg daily  Continue clonazepam 1 mg three times a day as needed for anxiety  4. Next appointment: 9/13 at 4:30

## 2022-03-27 ENCOUNTER — Telehealth (HOSPITAL_COMMUNITY): Payer: Self-pay | Admitting: Psychiatry

## 2022-03-27 ENCOUNTER — Encounter (HOSPITAL_COMMUNITY): Payer: Self-pay

## 2022-03-27 ENCOUNTER — Ambulatory Visit (HOSPITAL_COMMUNITY): Payer: Medicaid Other | Admitting: Psychiatry

## 2022-03-27 NOTE — Telephone Encounter (Signed)
Therapist contacted patient via text through [facility] for scheduled appointment.  Patient is sick today.  Patient and therapist agreed to cancel today's appointment and reschedule.Marland Kitchen

## 2022-03-31 ENCOUNTER — Telehealth: Payer: Self-pay

## 2022-03-31 NOTE — Telephone Encounter (Signed)
Patient called stating that since the increase she is having tremors and her head feels dizzy she decrease the medication to 2 capsules a day she does feel better as far as the tremors but still no motivation to do anything at all. Please advise.    venlafaxine XR (EFFEXOR-XR) 37.5 MG 24 hr capsule 90 capsule 1 03/21/2022 05/20/2022   Sig - Route: Take 3 capsules (112.5 mg total) by mouth daily with breakfast. - Oral   Sent to pharmacy as: venlafaxine XR (EFFEXOR-XR) 37.5 MG 24 hr capsule   Notes to Pharmacy: Dose uptitrated   E-Prescribing Status: Receipt confirmed by pharmacy (03/21/2022 11:58 AM EDT)

## 2022-03-31 NOTE — Telephone Encounter (Signed)
Please advise her to stay on venlafaxine 75 mg daily at this time then- and make sure she has follow ups with [name] for therapy

## 2022-04-02 NOTE — Telephone Encounter (Signed)
Called to advise patient of message she voiced understanding.

## 2022-04-13 ENCOUNTER — Encounter (INDEPENDENT_AMBULATORY_CARE_PROVIDER_SITE_OTHER): Payer: Self-pay

## 2022-04-14 ENCOUNTER — Telehealth: Payer: Self-pay

## 2022-04-14 NOTE — Telephone Encounter (Signed)
pt called left message that she doesn't think her effexor is working she has no motivation and no interest in doing anything. she wanted to speak with you about taking something else.

## 2022-04-14 NOTE — Telephone Encounter (Signed)
Could either of you contact her to schedule for a sooner visit tomorrow or Wed in available slot? Virtual is fine if that works better for her.

## 2022-04-14 NOTE — Telephone Encounter (Signed)
left message to call office to set up an appt.

## 2022-04-15 ENCOUNTER — Encounter: Payer: Self-pay | Admitting: Psychiatry

## 2022-04-15 ENCOUNTER — Telehealth (INDEPENDENT_AMBULATORY_CARE_PROVIDER_SITE_OTHER): Payer: Medicaid Other | Admitting: Psychiatry

## 2022-04-15 DIAGNOSIS — F4381 Prolonged grief disorder: Secondary | ICD-10-CM | POA: Diagnosis not present

## 2022-04-15 DIAGNOSIS — F331 Major depressive disorder, recurrent, moderate: Secondary | ICD-10-CM

## 2022-04-15 DIAGNOSIS — F411 Generalized anxiety disorder: Secondary | ICD-10-CM | POA: Diagnosis not present

## 2022-04-15 NOTE — Patient Instructions (Signed)
Continue venlafaxine 75 mg daily  Continue bupropion 450 mg daily  Start vraylar 1.5 mg daily ( Continue clonazepam 1 mg three times a day as needed for anxiety
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
## 2022-04-16 ENCOUNTER — Telehealth: Payer: Self-pay

## 2022-04-16 ENCOUNTER — Telehealth: Payer: Self-pay | Admitting: Psychiatry

## 2022-04-16 ENCOUNTER — Other Ambulatory Visit: Payer: Self-pay | Admitting: Psychiatry

## 2022-04-16 MED ORDER — CARIPRAZINE HCL 1.5 MG PO CAPS: 1.5000 mg | ORAL_CAPSULE | Freq: Every day | ORAL | 0 refills | Status: DC

## 2022-04-16 NOTE — Telephone Encounter (Signed)
prior auth approved 04-16-22 to 04-16-23 [place] Case # [id number]

## 2022-04-16 NOTE — Telephone Encounter (Signed)
received fax that a prior auth is needed for the vraylar 1.5mg 

## 2022-04-16 NOTE — Telephone Encounter (Signed)
went online to [email or web address]. submitted the prior auth - pending

## 2022-04-16 NOTE — Telephone Encounter (Signed)
vra

## 2022-04-16 NOTE — Telephone Encounter (Signed)
Patient called stating during appointment it was suggested that she can be put on Vrylar. Patient called the pharmacy, [facility] in [place] on 04-16-22, and they have not received a prescription. Please advise

## 2022-04-17 ENCOUNTER — Ambulatory Visit (INDEPENDENT_AMBULATORY_CARE_PROVIDER_SITE_OTHER): Payer: Medicaid Other | Admitting: Psychiatry

## 2022-04-17 DIAGNOSIS — F331 Major depressive disorder, recurrent, moderate: Secondary | ICD-10-CM | POA: Diagnosis not present

## 2022-04-17 NOTE — Progress Notes (Signed)
Virtual Visit via Video Note  I connected with Rebecca Barrera on 04/17/22 at 1:16 PM EST by a video enabled telemedicine application and verified that I am speaking with the correct person using two identifiers.  Location: Patient: Home Provider: [facility] [place] office    I discussed the limitations of evaluation and management by telemedicine and the availability of in person appointments. The patient expressed understanding and agreed to proceed.  I provided 40 minutes of non-face-to-face time during this encounter.   Rebecca Salvage, LCSW               THERAPIST PROGRESS NOTE     Session Time: Thursday  04/17/2022 1:16 PM - 1:56 PM   Participation Level: Active  Behavioral Response: CasualAlert/sadness,/tearfulness  Type of Therapy: Individual Therapy  Treatment Goals addressed:    I want to be able to get through this (losing my ex-husband) move on, find peace, want my life back" AEB pt resuming normal interest/pleasure/involvement in activties 2x per week for 60 days per pt's report"Rebecca Barrera WILL PRACTICE BEHAVIORAL ACTIVATION SKILLS 2-3 TIMES PER WEEK FOR THE NEXT 12 WEEKS      Progress on Goals: Initial   Interventions: Supportive, CBT  Summary: Rebecca Barrera is a 61 y.o. female who initially is referred for services by psychiatrist Dr. Vanetta Barrera due to patient experiencing symptoms of depression. She denies any psychiatric hospitalizations. She participated in outpatient therapy at [facility] for several years. She is a returning patient to this clinician and has been seen intermittently for 2 to 3 years.  Patient reports increased grief and loss issues triggered by the upcoming birthday of her deceased son who died by suicide in Mar 17, 2020.  His birthday is [birth year] 30.  Current symptoms include crying spells, sadness, and tearfulness.  Patient last was seen via virtual visit about 3-4 weeks ago.  She reports increased depressed mood, sadness, and crying spells since last  session.  She also reports having visual images of deceased son's death. She reports talking with Dr. Vanetta Barrera emergently and will begin taking Vraylar as soon as authorization from carrier has been approved.  Per patient's report, she had loss of interest in previously enjoyed activities and was suffering from poor motivation.  She reports now beginning to resume interest in activities and is planning to celebrate Thanksgiving with her daughter.   suicidal/Homicidal: Nowithout intent/plan  Therapist Response: Reviewed symptoms, facilitated patient expressing thoughts and feelings related to grief and loss issues, normalized feelings related to acute grief as a part of integrated grief, discussed rationale for and assisted patient practice grounding techniques to manage visual images, also encouraged patient to continue using meditation for relaxation techniques, encouraged patient to follow through with plans for the Thanksgiving holiday, assisted patient identify ways to cope with grief and loss issues during the holiday.   Plan: Return again in 2  weeks   Diagnosis: Axis I: MDD, Recurrent, Moderate    Axis II: No diagnosis  Collaboration of Care: Psychiatrist AEB patient working with psychiatrist Dr. Vanetta Barrera, clinician reviewing chart.  Patient/Guardian was advised Release of Information must be obtained prior to any record release in order to collaborate their care with an outside provider. Patient/Guardian was advised if they have not already done so to contact the registration department to sign all necessary forms in order for [place] to release information regarding their care.   Consent: Patient/Guardian gives verbal consent for treatment and assignment of benefits for services provided during this visit. Patient/Guardian expressed  understanding and agreed to proceed.   Rebecca Barrera E Rebecca Wilborn, LCSW   ,

## 2022-04-21 NOTE — Telephone Encounter (Signed)
Called her and left the voice message. She was advised to discontinue the medication (Vraylar) until the next visit.

## 2022-04-21 NOTE — Telephone Encounter (Signed)
pt called states that she can not take the vraylar it is making her dizzyness and so she not taking it. she wants to speak with you about trying something else.

## 2022-05-01 ENCOUNTER — Ambulatory Visit (HOSPITAL_COMMUNITY): Payer: Medicaid Other | Admitting: Psychiatry

## 2022-05-06 ENCOUNTER — Other Ambulatory Visit: Payer: Self-pay | Admitting: Psychiatry

## 2022-05-06 DIAGNOSIS — F411 Generalized anxiety disorder: Secondary | ICD-10-CM
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
## 2022-05-09 ENCOUNTER — Encounter: Payer: Self-pay | Admitting: Psychiatry

## 2022-05-09 ENCOUNTER — Telehealth (INDEPENDENT_AMBULATORY_CARE_PROVIDER_SITE_OTHER): Payer: Medicaid Other | Admitting: Psychiatry

## 2022-05-09 DIAGNOSIS — F4381 Prolonged grief disorder: Secondary | ICD-10-CM | POA: Diagnosis not present

## 2022-05-09 DIAGNOSIS — F330 Major depressive disorder, recurrent, mild: Secondary | ICD-10-CM

## 2022-05-09 DIAGNOSIS — F411 Generalized anxiety disorder: Secondary | ICD-10-CM

## 2022-05-09 MED ORDER — BUPROPION HCL ER (XL) 300 MG PO TB24: 300.0000 mg | ORAL_TABLET | Freq: Every day | ORAL | 5 refills | Status: DC

## 2022-05-09 MED ORDER — VENLAFAXINE HCL ER 75 MG PO CP24: 75.0000 mg | ORAL_CAPSULE | Freq: Every day | ORAL | 0 refills | Status: DC

## 2022-05-09 MED ORDER — CLONAZEPAM 1 MG PO TABS: 1.0000 mg | ORAL_TABLET | Freq: Three times a day (TID) | ORAL | 0 refills | Status: DC

## 2022-05-23 ENCOUNTER — Other Ambulatory Visit (HOSPITAL_COMMUNITY): Payer: Self-pay | Admitting: Family Medicine

## 2022-05-23 ENCOUNTER — Ambulatory Visit (HOSPITAL_COMMUNITY)
Admission: RE | Admit: 2022-05-23 | Discharge: 2022-05-23 | Disposition: A | Discharge status: Home / Self Care | Payer: Medicaid Other | Source: Ambulatory Visit | Attending: Family Medicine | Admitting: Family Medicine

## 2022-05-23 DIAGNOSIS — M542 Cervicalgia: Secondary | ICD-10-CM | POA: Insufficient documentation

## 2022-06-04 ENCOUNTER — Encounter (HOSPITAL_COMMUNITY): Payer: Self-pay | Admitting: Family Medicine

## 2022-06-09 ENCOUNTER — Ambulatory Visit (HOSPITAL_COMMUNITY): Payer: Medicaid Other | Admitting: Psychiatry

## 2022-06-09 ENCOUNTER — Telehealth (HOSPITAL_COMMUNITY): Payer: Self-pay | Admitting: Psychiatry

## 2022-06-09 NOTE — Telephone Encounter (Signed)
Therapist contacted patient via text through [place] for scheduled appointment.  Patient is sick.  Therapist and patient agreed to reschedule appointment.
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
## 2022-06-18 ENCOUNTER — Telehealth (INDEPENDENT_AMBULATORY_CARE_PROVIDER_SITE_OTHER): Payer: Medicaid Other | Admitting: Psychiatry

## 2022-06-18 ENCOUNTER — Encounter: Payer: Self-pay | Admitting: Psychiatry

## 2022-06-18 DIAGNOSIS — F4381 Prolonged grief disorder: Secondary | ICD-10-CM | POA: Diagnosis not present

## 2022-06-18 DIAGNOSIS — F330 Major depressive disorder, recurrent, mild: Secondary | ICD-10-CM

## 2022-06-18 DIAGNOSIS — F411 Generalized anxiety disorder: Secondary | ICD-10-CM

## 2022-06-18 MED ORDER — CLONAZEPAM 1 MG PO TABS: 1.0000 mg | ORAL_TABLET | Freq: Three times a day (TID) | ORAL | 0 refills | Status: DC

## 2022-06-18 MED ORDER — L-METHYLFOLATE 7.5 MG PO TABS: 7.5000 mg | ORAL_TABLET | Freq: Every day | ORAL | 1 refills | Status: AC

## 2022-06-18 NOTE — Patient Instructions (Signed)
Continue venlafaxine 75 mg daily  Continue bupropion 450 mg daily  Start L-methylfolate 7.5 mg daily Continue clonazepam 1 mg three times a day as needed for anxiety   4. Next appointment: 2/14 at 4:30

## 2022-06-23 ENCOUNTER — Ambulatory Visit (INDEPENDENT_AMBULATORY_CARE_PROVIDER_SITE_OTHER): Payer: Medicaid Other | Admitting: Psychiatry

## 2022-06-23 DIAGNOSIS — F4381 Prolonged grief disorder: Secondary | ICD-10-CM

## 2022-06-23 DIAGNOSIS — F330 Major depressive disorder, recurrent, mild: Secondary | ICD-10-CM | POA: Diagnosis not present

## 2022-06-23 NOTE — Progress Notes (Signed)
Virtual Visit via Video Note  I connected with Rebecca Barrera on 06/23/22 at 1:14 PM EST by a video enabled telemedicine application and verified that I am speaking with the correct person using two identifiers.  Location: Patient: Home Provider: [place] office    I discussed the limitations of evaluation and management by telemedicine and the availability of in person appointments. The patient expressed understanding and agreed to proceed.  I provided 38 minutes of non-face-to-face time during this encounter.   Alonza Smoker, LCSW              THERAPIST PROGRESS NOTE     Session Time: Monday 06/23/2022 1:14 PM -  1:52 PM   Participation Level: Active  Behavioral Response: CasualAlert/ pleasant  Type of Therapy: Individual Therapy  Treatment Goals addressed:    I want to be able to get through this (losing my ex-husband) move on, find peace, want my life back" AEB pt resuming normal interest/pleasure/involvement in activties 2x per week for 60 days per pt's report"Rebecca Barrera WILL PRACTICE BEHAVIORAL ACTIVATION SKILLS 2-3 TIMES PER WEEK FOR THE NEXT 12 WEEKS      Progress on Goals: progressing   Interventions: Supportive, CBT  Summary: Rebecca Barrera is a 62 y.o. female who initially is referred for services by psychiatrist Dr. Modesta Messing due to patient experiencing symptoms of depression. She denies any psychiatric hospitalizations. She participated in outpatient therapy at [facility] for several years. She is a returning patient to this clinician and has been seen intermittently for 2 to 3 years.  Patient reports increased grief and loss issues triggered by the upcoming birthday of her deceased son who died by suicide in 04-03-2020.  His birthday is [birth year] 30.  Current symptoms include crying spells, sadness, and tearfulness.  Patient last was seen via virtual visit about 7-8 weeks ago.  She report improved mood, increased motivation, and feeling more relaxed since last  session..  Per patient's report, she manage is the holidays very well.  She reports preparing the holiday meals and enjoying spending time with her daughter.  She reports experiencing some sadness regarding her deceased son but not dwelling on it.  She and her daughter talked about patient's deceased son as well as her daughter's father but made intentional efforts for this not to be the focus of their conversation.  Patient is pleased with her efforts.  She expresses increased acceptance of son's death and verbalizes understanding of integrated grief.  She reports a better realization of her role as a mother to her remaining children and is making intentional efforts to build her relationship with her daughter.  Patient reports beginning to resume interest in other activities now that the holidays have passed.    suicidal/Homicidal: Nowithout intent/plan  Therapist Response: Reviewed symptoms, praised and reinforced patient's helpful coping strategies to manage the holidays, reviewed integrated grief, lysed feelings related to grief and loss, assisted patient identify ways to continue to pursue her value of family with her daughter, discussed the role of consistent behavioral activation, developed plan with patient to resume use of daily planning   Plan: Return again in 2  weeks   Diagnosis: Axis I: MDD, Recurrent, Moderate    Axis II: No diagnosis  Collaboration of Care: Psychiatrist AEB patient working with psychiatrist Dr. Modesta Messing, clinician reviewing chart.  Patient/Guardian was advised Release of Information must be obtained prior to any record release in order to collaborate their care with an outside provider. Patient/Guardian was advised  if they have not already done so to contact the registration department to sign all necessary forms in order for [place] to release information regarding their care.   Consent: Patient/Guardian gives verbal consent for treatment and assignment of benefits for  services provided during this visit. Patient/Guardian expressed understanding and agreed to proceed.   Rebecca Messenger E Jazzmine Kleiman, LCSW   ,

## 2022-07-07 ENCOUNTER — Ambulatory Visit (INDEPENDENT_AMBULATORY_CARE_PROVIDER_SITE_OTHER): Payer: Medicaid Other | Admitting: Psychiatry

## 2022-07-07 DIAGNOSIS — F330 Major depressive disorder, recurrent, mild: Secondary | ICD-10-CM

## 2022-07-07 DIAGNOSIS — F4381 Prolonged grief disorder: Secondary | ICD-10-CM
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
## 2022-07-16 ENCOUNTER — Telehealth (INDEPENDENT_AMBULATORY_CARE_PROVIDER_SITE_OTHER): Payer: Medicaid Other | Admitting: Psychiatry

## 2022-07-16 ENCOUNTER — Encounter: Payer: Self-pay | Admitting: Psychiatry

## 2022-07-16 DIAGNOSIS — F4381 Prolonged grief disorder: Secondary | ICD-10-CM

## 2022-07-16 DIAGNOSIS — F331 Major depressive disorder, recurrent, moderate: Secondary | ICD-10-CM | POA: Diagnosis not present

## 2022-07-16 DIAGNOSIS — F411 Generalized anxiety disorder: Secondary | ICD-10-CM | POA: Diagnosis not present

## 2022-07-16 MED ORDER — CLONAZEPAM 1 MG PO TABS: 1.0000 mg | ORAL_TABLET | Freq: Three times a day (TID) | ORAL | 0 refills | Status: DC

## 2022-07-16 MED ORDER — VENLAFAXINE HCL ER 75 MG PO CP24: 75.0000 mg | ORAL_CAPSULE | Freq: Every day | ORAL | 0 refills | Status: DC

## 2022-07-16 NOTE — Patient Instructions (Signed)
Continue venlafaxine 75 mg daily  Continue bupropion 450 mg daily  Hold  L-methylfolate 7.5 mg daily  Continue clonazepam 1 mg three times a day as needed for anxiety   4. Next appointment: 4/3 at 4 PM

## 2022-07-21 ENCOUNTER — Ambulatory Visit (INDEPENDENT_AMBULATORY_CARE_PROVIDER_SITE_OTHER): Payer: Medicaid Other | Admitting: Psychiatry

## 2022-07-21 DIAGNOSIS — F4381 Prolonged grief disorder: Secondary | ICD-10-CM

## 2022-07-21 NOTE — Progress Notes (Signed)
Virtual Visit via Video Note  I connected with Rebecca Barrera on 07/21/22 at 1:06 PM EST  by a video enabled telemedicine application and verified that I am speaking with the correct person using two identifiers.  Location: Patient: Home Provider: [place] office   I discussed the limitations of evaluation and management by telemedicine and the availability of in person appointments. The patient expressed understanding and agreed to proceed.  I provided 47 minutes of non-face-to-face time during this encounter.   Alonza Smoker, LCSW              THERAPIST PROGRESS NOTE     Session Time: Monday 07/21/2022 1:06 PM - 1:53 PM   Participation Level: Active  Behavioral Response: CasualAlert/depressed, angry, tearful  Type of Therapy: Individual Therapy  Treatment Goals addressed:    I want to be able to get through this (losing my ex-husband) move on, find peace, want my life back" AEB pt resuming normal interest/pleasure/involvement in activties 2x per week for 60 days per pt's report"[facility] WILL PRACTICE BEHAVIORAL ACTIVATION SKILLS 2-3 TIMES PER WEEK FOR THE NEXT 12 WEEKS      Progress on Goals: progressing   Interventions: Supportive, CBT  Summary: Rebecca Barrera is a 62 y.o. female who initially is referred for services by psychiatrist Dr. Modesta Messing due to patient experiencing symptoms of depression. She denies any psychiatric hospitalizations. She participated in outpatient therapy at [facility] for several years. She is a returning patient to this clinician and has been seen intermittently for 2 to 3 years.  Patient reports increased grief and loss issues triggered by the upcoming birthday of her deceased son who died by suicide in April 10, 2020.  His birthday is [birth year].  Current symptoms include crying spells, sadness, and tearfulness.  Patient last was seen via virtual visit about 2 weeks ago.  She reports continued depressed mood, crying spells, and sadness since but  managing better since last session. She reports decreased visual images of son's suicide states she no longer has these at night. She continues to experience these during the day but reports managing better with the use of grounding techniques. She expresses desire to be able to talk about his manner of death and the visual images as she states she knows she needs to deal with this.  She verbalizes questions about why her son did what he did and struggles with accepting his manner of death.  Patient reports she has increased behavioral activation and has been going outside more, cleaning her home, and talking with others.  She also reports continued interaction with daughter as well as reconciling with her remaining son.  suicidal/Homicidal: Nowithout intent/plan  Therapist Response: Reviewed symptoms, discussed stressors, facilitated expression of thoughts and feelings, validated feelings, praised and reinforced patient's efforts to use grounding techniques, discussed effects, discussed language around suicide focusing on dying by suicide rather than committing suicide, discussed son's condition as an illness that caused suicide, praised and reinforced patient's efforts to increase behavioral activation, discussed effects, developed plan with patient to continue consistent efforts regarding daily planning   Plan: Return again in 2  weeks   Diagnosis: Axis I: MDD, Recurrent, Moderate    Axis II: No diagnosis  Collaboration of Care: Psychiatrist AEB patient working with psychiatrist Dr. Modesta Messing, clinician reviewing chart.  Patient/Guardian was advised Release of Information must be obtained prior to any record release in order to collaborate their care with an outside provider. Patient/Guardian was advised if they have  not already done so to contact the registration department to sign all necessary forms in order for [place] to release information regarding their care.   Consent: Patient/Guardian gives  verbal consent for treatment and assignment of benefits for services provided during this visit. Patient/Guardian expressed understanding and agreed to proceed.   Kelleigh Skerritt E Tiauna Whisnant, LCSW   ,

## 2022-08-04 ENCOUNTER — Ambulatory Visit (INDEPENDENT_AMBULATORY_CARE_PROVIDER_SITE_OTHER): Payer: Medicaid Other | Admitting: Psychiatry

## 2022-08-04 DIAGNOSIS — F4381 Prolonged grief disorder: Secondary | ICD-10-CM

## 2022-08-04 NOTE — Progress Notes (Signed)
Virtual Visit via Video Note  I connected with Rebecca Barrera on 08/04/22 at 2:07 PM EST  by a video enabled telemedicine application and verified that I am speaking with the correct person using two identifiers.  Location: Patient: Home Provider: [place] office    I discussed the limitations of evaluation and management by telemedicine and the availability of in person appointments. The patient expressed understanding and agreed to proceed.  I provided 52 minutes of non-face-to-face time during this encounter.   Alonza Smoker, LCSW               THERAPIST PROGRESS NOTE     Session Time: Monday 08/04/2022 2:07 PM - 2:59 PM    Participation Level: Active  Behavioral Response: CasualAlert/depressed, angry, tearful  Type of Therapy: Individual Therapy  Treatment Goals addressed:    I want to be able to get through this (losing my ex-husband) move on, find peace, want my life back" AEB pt resuming normal interest/pleasure/involvement in activties 2x per week for 60 days per pt's report"Rebecca Barrera WILL PRACTICE BEHAVIORAL ACTIVATION SKILLS 2-3 TIMES PER WEEK FOR THE NEXT 12 WEEKS      Progress on Goals: progressing   Interventions: Supportive, CBT  Summary: Rebecca Barrera is a 62 y.o. female who initially is referred for services by psychiatrist Dr. Modesta Messing due to patient experiencing symptoms of depression. She denies any psychiatric hospitalizations. She participated in outpatient therapy at [facility] for several years. She is a returning patient to this clinician and has been seen intermittently for 2 to 3 years.  Patient reports increased grief and loss issues triggered by the upcoming birthday of her deceased son who died by suicide in March 19, 2020.  His birthday is [birth year].  Current symptoms include crying spells, sadness, and tearfulness.  Patient last was seen via virtual visit about 2 weeks ago.  She reports continued depressed mood, crying spells, and sadness decreased  intensity the and frequency.  She also reports continuing to manage symptoms better since last session.  Patient continues to have visual images of son's death but reports decreased frequency.  She reports not feeling as overwhelmed when she has these images.  She reports being able to allow painful feelings to wash over her like a wave and then resuming her normal activities.  She continues to express sadness and becomes tearful when talking about her son's manner of death.  She expresses increased acceptance that his death was the result of illness.  Patient maintains involvement in activities like performing household tasks and checking her mailbox in the apartment complex.  She is pleased she has increased her time walking and reports this has been helpful physically and emotionally.  She expresses frustration and sadness she does not have as much socialization time with daughter due to daughter's increased work demands and changed schedule.  suicidal/Homicidal: Nowithout intent/plan  Therapist Response: Reviewed symptoms, praised and reinforced patient's efforts to sit with her feelings and use grounding techniques, discussed effects, facilitated patient sharing more information regarding details about son's death, assisted patient practice grounding techniques to regain composure , facilitated patient identifying and verbalizing feelings of anger, praised and reinforced patient's continued efforts to increase behavioral activation, discussed effects, began to explore ways for patient to expand social network and increase socialization,  Plan: Return again in 2  weeks   Diagnosis: Axis I: MDD, Recurrent, Moderate    Axis II: No diagnosis  Collaboration of Care: Psychiatrist AEB patient working with psychiatrist  Dr. Modesta Messing, clinician reviewing chart.  Patient/Guardian was advised Release of Information must be obtained prior to any record release in order to collaborate their care with an outside  provider. Patient/Guardian was advised if they have not already done so to contact the registration department to sign all necessary forms in order for [place] to release information regarding their care.   Consent: Patient/Guardian gives verbal consent for treatment and assignment of benefits for services provided during this visit. Patient/Guardian expressed understanding and agreed to proceed.   Mariellen Blaney E Jailah Willis, LCSW   ,

## 2022-08-12 ENCOUNTER — Telehealth: Payer: Self-pay

## 2022-08-12 NOTE — Telephone Encounter (Signed)
pt called left message that she not doing well with the wellbutrin and the effexor and she thinks she needs a higher dosage of wellbutrin maybe. she like to speak with a provider

## 2022-08-12 NOTE — Telephone Encounter (Signed)
pt appt was moved up to 08-20-22.

## 2022-08-12 NOTE — Telephone Encounter (Signed)
Attempted to contact patient to discuss her concern, had to leave a voicemail.
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
## 2022-08-20 ENCOUNTER — Encounter: Payer: Self-pay | Admitting: Psychiatry

## 2022-08-20 ENCOUNTER — Telehealth (INDEPENDENT_AMBULATORY_CARE_PROVIDER_SITE_OTHER): Payer: Medicaid Other | Admitting: Psychiatry

## 2022-08-20 DIAGNOSIS — F411 Generalized anxiety disorder: Secondary | ICD-10-CM

## 2022-08-20 DIAGNOSIS — F331 Major depressive disorder, recurrent, moderate: Secondary | ICD-10-CM

## 2022-08-20 DIAGNOSIS — F4381 Prolonged grief disorder: Secondary | ICD-10-CM | POA: Diagnosis not present

## 2022-08-20 MED ORDER — CLONAZEPAM 1 MG PO TABS: 1.0000 mg | ORAL_TABLET | Freq: Three times a day (TID) | ORAL | 0 refills | Status: DC

## 2022-08-20 NOTE — Patient Instructions (Signed)
Continue venlafaxine 75 mg daily  Continue bupropion 450 mg daily  Continue clonazepam 1 mg three times a day as needed for anxiety  4. Next appointment: 4/17 at 4 30

## 2022-08-21 ENCOUNTER — Ambulatory Visit (INDEPENDENT_AMBULATORY_CARE_PROVIDER_SITE_OTHER): Payer: Medicaid Other | Admitting: Psychiatry

## 2022-08-21 ENCOUNTER — Encounter (INDEPENDENT_AMBULATORY_CARE_PROVIDER_SITE_OTHER): Payer: Medicaid Other

## 2022-08-21 ENCOUNTER — Telehealth: Payer: Self-pay | Admitting: Psychiatry

## 2022-08-21 DIAGNOSIS — F4381 Prolonged grief disorder: Secondary | ICD-10-CM | POA: Diagnosis not present

## 2022-08-21 DIAGNOSIS — F331 Major depressive disorder, recurrent, moderate: Secondary | ICD-10-CM | POA: Diagnosis not present

## 2022-08-21 MED ORDER — VORTIOXETINE HBR 5 MG PO TABS: 5.0000 mg | ORAL_TABLET | Freq: Every day | ORAL | 1 refills | Status: DC

## 2022-08-21 MED ORDER — VENLAFAXINE HCL ER 37.5 MG PO CP24: 37.5000 mg | ORAL_CAPSULE | Freq: Every day | ORAL | 0 refills | Status: DC

## 2022-08-21 NOTE — Progress Notes (Signed)
Virtual Visit via Video Note  I connected with Rebecca Barrera on 08/21/22 at 2:11 PM EDT  by a video enabled telemedicine application and verified that I am speaking with the correct person using two identifiers.  Location: Patient: Home Provider: [place] office    I discussed the limitations of evaluation and management by telemedicine and the availability of in person appointments. The patient expressed understanding and agreed to proceed.  I provided 32 minutes of non-face-to-face time during this encounter.   Rebecca Smoker, LCSW              THERAPIST PROGRESS NOTE     Session Time: Thursday 08/21/2022 2:11 PM - 2:43 PM   Participation Level: Active  Behavioral Response: CasualAlert/depressed, angry, tearful  Type of Therapy: Individual Therapy  Treatment Goals addressed:    I want to be able to get through this (losing my ex-husband) move on, find peace, want my life back" AEB pt resuming normal interest/pleasure/involvement in activties 2x per week for 60 days per pt's report"Rebecca Barrera WILL PRACTICE BEHAVIORAL ACTIVATION SKILLS 2-3 TIMES PER WEEK FOR THE NEXT 12 WEEKS      Progress on Goals: progressing   Interventions: Supportive, CBT  Summary: Rebecca Barrera is a 62 y.o. female who initially is referred for services by psychiatrist Dr. Modesta Messing due to patient experiencing symptoms of depression. She denies any psychiatric hospitalizations. She participated in outpatient therapy at [facility] for several years. She is a returning patient to this clinician and has been seen intermittently for 2 to 3 years.  Patient reports increased grief and loss issues triggered by the upcoming birthday of her deceased son who died by suicide in 03-07-2020.  His birthday is [birth year] 30.  Current symptoms include crying spells, sadness, and tearfulness.  Patient last was seen via virtual visit about 2 weeks ago.  She reports increased depressed mood, sadness, and tearfulness.  Patient  reports trigger as being the beginning of the spring season as her deceased son likes this time of year.  She verbalizes thoughts of son not being here to see it.  She also reports additional stress related to conflict with daughter yesterday.  Per patient's report, daughter does not understand patient still grieving over her son.  Patient reports she has maintained involvement in activity and has been trying to go outside more since the weather has improved.  Therapist and patient agreed to end session early as patient has a severe migraine.  suicidal/Homicidal: Nowithout intent/plan  Therapist Response: Reviewed symptoms, discussed stressors, facilitated expression of thoughts and feelings, validated and normalized feelings related to grief, assisted patient began to identify positive memories regarding son and Spring, developed plan with patient to make a collage of activities/items her son like to do/sleep/use in the spring and bring to next session   Plan: Return again in 2  weeks   Diagnosis: Axis I: MDD, Recurrent, Moderate    Axis II: No diagnosis  Collaboration of Care: Psychiatrist AEB patient working with psychiatrist Dr. Modesta Messing, clinician reviewing chart.  Patient/Guardian was advised Release of Information must be obtained prior to any record release in order to collaborate their care with an outside provider. Patient/Guardian was advised if they have not already done so to contact the registration department to sign all necessary forms in order for [place] to release information regarding their care.   Consent: Patient/Guardian gives verbal consent for treatment and assignment of benefits for services provided during this visit. Patient/Guardian expressed understanding  and agreed to proceed.   Rebecca Barrera E Rebecca [name], LCSW   ,

## 2022-08-21 NOTE — Telephone Encounter (Signed)
Received a lab result.  TSH 2.32 07/03/2022.

## 2022-08-22 ENCOUNTER — Telehealth: Payer: Self-pay

## 2022-08-22 NOTE — Telephone Encounter (Signed)
Patient called to report that after her visit she thought about the discussion of switching her medication from Effexor to Trintellix and she has made the decision of trying the Trintellix at a low dose. Please advise  Last visit 08/21/22  Next visit 09/17/22

## 2022-08-22 NOTE — Telephone Encounter (Signed)
Please advise her to see my chart message.She asked the same there, and I replied back to her.

## 2022-08-25 NOTE — Telephone Encounter (Signed)
Called to inform patient of the message  no answer left voicemail for patient to return call to office

## 2022-09-03 ENCOUNTER — Telehealth: Payer: Medicaid Other | Admitting: Psychiatry

## 2022-09-04 ENCOUNTER — Ambulatory Visit (INDEPENDENT_AMBULATORY_CARE_PROVIDER_SITE_OTHER): Payer: Medicaid Other | Admitting: Psychiatry

## 2022-09-04 DIAGNOSIS — F331 Major depressive disorder, recurrent, moderate: Secondary | ICD-10-CM

## 2022-09-04 DIAGNOSIS — F4381 Prolonged grief disorder: Secondary | ICD-10-CM | POA: Diagnosis not present

## 2022-09-04 NOTE — Progress Notes (Signed)
Virtual Visit via Video Note  I connected with Rebecca Barrera on 09/04/22 at 2:12 PM EDT  by a video enabled telemedicine application and verified that I am speaking with the correct person using two identifiers.  Location: Patient: Home Provider: [place] office    I discussed the limitations of evaluation and management by telemedicine and the availability of in person appointments. The patient expressed understanding and agreed to proceed.    I provided 51 minutes of non-face-to-face time during this encounter.   Alonza Smoker, LCSW               THERAPIST PROGRESS NOTE     Session Time: Thursday 09/04/2022 2:12 PM - 3:03 PM  Participation Level: Active  Behavioral Response: CasualAlert/depressed, angry, tearful  Type of Therapy: Individual Therapy  Treatment Goals addressed:    I want to be able to get through this (losing my ex-husband) move on, find peace, want my life back" AEB pt resuming normal interest/pleasure/involvement in activties 2x per week for 60 days per pt's report"Keven WILL PRACTICE BEHAVIORAL ACTIVATION SKILLS 2-3 TIMES PER WEEK FOR THE NEXT 12 WEEKS      Progress on Goals: progressing   Interventions: Supportive, CBT  Summary: Rebecca Barrera is a 62 y.o. female who initially is referred for services by psychiatrist Dr. Modesta Messing due to patient experiencing symptoms of depression. She denies any psychiatric hospitalizations. She participated in outpatient therapy at [facility] for several years. She is a returning patient to this clinician and has been seen intermittently for 2 to 3 years.  Patient reports increased grief and loss issues triggered by the upcoming birthday of her deceased son who died by suicide in April 09, 2020.  His birthday is [birth year].  Current symptoms include crying spells, sadness, and tearfulness.  Patient last was seen via virtual visit about 2 weeks ago.  She reports continued  depressed mood, sadness, and tearfulness.  She  reports she was not able to complete the collage as she would become overwhelmed with sadness and tears every time she started working on the collage.  She has tried to maintain involvement and doing light household task.  She reports she has been walking around her apartment complex 1-2 times per day about 2-3 times per week.  She also reports reconnecting with her daughter but reports they have little time together due to daughter's work schedule.  Patient reports little to no other social involvement.   suicidal/Homicidal: Nowithout intent/plan  Therapist Response: Reviewed symptoms, facilitated patient expressing thoughts and feelings about deceased son, validated feelings and normalized feelings related to grief, praised and reinforced patient's efforts to increase physical activity through walking, developed plan with patient to increase walking to daily if possible weather permitting, also discussed the role of social connections and involvement, began to assist patient identify other ways to increase social activity and social contacts as part of the task of reengaging in life, developed a plan with patient to pursue some of the avenues for social contact discussed in session  Plan: Return again in 2  weeks   Diagnosis: Axis I: MDD, Recurrent, Moderate    Axis II: No diagnosis  Collaboration of Care: Psychiatrist AEB patient working with psychiatrist Dr. Modesta Messing, clinician reviewing chart.  Patient/Guardian was advised Release of Information must be obtained prior to any record release in order to collaborate their care with an outside provider. Patient/Guardian was advised if they have not already done so to contact the registration department  to sign all necessary forms in order for [place] to release information regarding their care.   Consent: Patient/Guardian gives verbal consent for treatment and assignment of benefits for services provided during this visit. Patient/Guardian expressed  understanding and agreed to proceed.   Aubryn Spinola E Algie Cales, LCSW   ,

## 2022-09-08 NOTE — Telephone Encounter (Signed)
I've spent a total time of 9 minutes providing service to this patient-generated inquiry in the MyChart message
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
## 2022-09-16 ENCOUNTER — Ambulatory Visit: Payer: Medicaid Other | Admitting: Neurology

## 2022-09-17 ENCOUNTER — Encounter: Payer: Self-pay | Admitting: Psychiatry

## 2022-09-17 ENCOUNTER — Telehealth (INDEPENDENT_AMBULATORY_CARE_PROVIDER_SITE_OTHER): Payer: Medicaid Other | Admitting: Psychiatry

## 2022-09-17 DIAGNOSIS — F411 Generalized anxiety disorder: Secondary | ICD-10-CM | POA: Diagnosis not present

## 2022-09-17 DIAGNOSIS — F4381 Prolonged grief disorder: Secondary | ICD-10-CM

## 2022-09-17 DIAGNOSIS — F330 Major depressive disorder, recurrent, mild: Secondary | ICD-10-CM

## 2022-09-17 MED ORDER — CLONAZEPAM 1 MG PO TABS: 1.0000 mg | ORAL_TABLET | Freq: Two times a day (BID) | ORAL | 0 refills | Status: DC

## 2022-09-17 MED ORDER — VENLAFAXINE HCL ER 75 MG PO CP24: 75.0000 mg | ORAL_CAPSULE | Freq: Every day | ORAL | 2 refills | Status: DC

## 2022-09-17 MED ORDER — BUPROPION HCL ER (XL) 150 MG PO TB24: 150.0000 mg | ORAL_TABLET | Freq: Every day | ORAL | 5 refills | Status: DC

## 2022-09-17 NOTE — Patient Instructions (Signed)
Continue venlafaxine 75 mg daily  Continue bupropion 450 mg daily  Decrease clonazepam 1 mg twice a day as needed for anxiety 4. Next appointment: 5/17 at 10 am

## 2022-09-22 ENCOUNTER — Ambulatory Visit (INDEPENDENT_AMBULATORY_CARE_PROVIDER_SITE_OTHER): Payer: Medicaid Other | Admitting: Psychiatry

## 2022-09-22 DIAGNOSIS — F4381 Prolonged grief disorder: Secondary | ICD-10-CM

## 2022-09-22 NOTE — Progress Notes (Signed)
Virtual Visit via Video Note  I connected with [name] on 09/22/22 at 3:07 PM EDT  by a video enabled telemedicine application and verified that I am speaking with the correct person using two identifiers.  Location: Patient: Home Provider: [facility] [place] office    I discussed the limitations of evaluation and management by telemedicine and the availability of in person appointments. The patient expressed understanding and agreed to proceed.   I provided 43 minutes of non-face-to-face time during this encounter.   Rebecca Salvage, LCSW                THERAPIST PROGRESS NOTE     Session Time: Monday  09/22/2022 3:07 PM - 3:50 PM   Participation Level: Active  Behavioral Response: CasualAlert/depressed, tearful  Type of Therapy: Individual Therapy  Treatment Goals addressed:    I want to be able to get through this (losing my ex-husband) move on, find peace, want my life back" AEB pt resuming normal interest/pleasure/involvement in activties 2x per week for 60 days per pt's report"Tomara WILL PRACTICE BEHAVIORAL ACTIVATION SKILLS 2-3 TIMES PER WEEK FOR THE NEXT 12 WEEKS      Progress on Goals: progressing   Interventions: Supportive, CBT  Summary: Rebecca Barrera is a 62 y.o. female who initially is referred for services by psychiatrist Dr. Vanetta Shawl due to patient experiencing symptoms of depression. She denies any psychiatric hospitalizations. She participated in outpatient therapy at [facility] for several years. She is a returning patient to this clinician and has been seen intermittently for 2 to 3 years.  Patient reports increased grief and loss issues triggered by the upcoming birthday of her deceased son who died by suicide in 04-09-20.  His birthday is [birth year] 30.  Current symptoms include crying spells, sadness, and tearfulness.  Patient last was seen via virtual visit about 2 weeks ago.  She reports improved mood, increased motivation, and increased behavioral  activation since last session.  Patient reports completing household task regularly.  She also reports doing more activities with her daughter since her daughter's work hours have  been reduced.  She states daughter was with her most of the day yesterday and reports enjoying watching a movie with daughter yesterday.  They also have begun exploring different activities they can pursue.  Patient reports walking consistently 3 days a week around her apartment complex and walking for longer distances since last session.  She also initiated contact with one of her neighbor's sons and asked him about his mother.  Patient reports speaking to this neighbor frequently prior to patient's son's death.   suicidal/Homicidal: Nowithout intent/plan  Therapist Response: Reviewed symptoms, reinforced patient's increased behavioral activation/socialization, discussed effects, praised and reinforced patient's efforts to initiate contact with her neighbors, discussed effects, praised and reinforced patient's involvement with her daughter, encouraged patient to follow through with plans regarding activities with daughter, facilitated patient verbalizing her past dependence on her son who now deceased and the effects of that dependence on her functioning upon his death, assisted patient identify ways to expand her support system/social contact wellness activities beyond her daughter, developed plan with patient to follow through on plans to contact her neighbor and discussed possible activities to pursue,  Plan: Return again in 2  weeks   Diagnosis: Axis I: MDD, Recurrent, Moderate    Axis II: No diagnosis  Collaboration of Care: Psychiatrist AEB patient working with psychiatrist Dr. Vanetta Shawl, clinician reviewing chart.  Patient/Guardian was advised Release of Information must  be obtained prior to any record release in order to collaborate their care with an outside provider. Patient/Guardian was advised if they have not  already done so to contact the registration department to sign all necessary forms in order for [place] to release information regarding their care.   Consent: Patient/Guardian gives verbal consent for treatment and assignment of benefits for services provided during this visit. Patient/Guardian expressed understanding and agreed to proceed.   Fryda Molenda E Kinzey Sheriff, LCSW   ,

## 2022-10-13 ENCOUNTER — Ambulatory Visit (INDEPENDENT_AMBULATORY_CARE_PROVIDER_SITE_OTHER): Payer: Medicaid Other | Admitting: Psychiatry

## 2022-10-13 DIAGNOSIS — F4381 Prolonged grief disorder: Secondary | ICD-10-CM

## 2022-10-13 NOTE — Progress Notes (Signed)
Virtual Visit via Video Note  I connected with Rebecca Barrera on 10/13/22 at 2:12 PM EDT  by a video enabled telemedicine application and verified that I am speaking with the correct person using two identifiers.  Location: Patient: Home Provider: [facility] [place] office   I discussed the limitations of evaluation and management by telemedicine and the availability of in person appointments. The patient expressed understanding and agreed to proceed.  I provided 23 minutes of non-face-to-face time during this encounter.   [name], LCSW                 THERAPIST PROGRESS NOTE     Session Time: Monday  10/13/2022 2:12 PM -  2:35 PM   Participation Level: Active  Behavioral Response: CasualAlert/depressed, tearful  Type of Therapy: Individual Therapy  Treatment Goals addressed:    I want to be able to get through this (losing my ex-husband) move on, find peace, want my life back" AEB pt resuming normal interest/pleasure/involvement in activties 2x per week for 60 days per pt's report"Kristen WILL PRACTICE BEHAVIORAL ACTIVATION SKILLS 2-3 TIMES PER WEEK FOR THE NEXT 12 WEEKS      Progress on Goals: progressing   Interventions: Supportive, CBT  Summary: Rebecca Barrera is a 62 y.o. female who initially is referred for services by psychiatrist Dr. Vanetta Shawl due to patient experiencing symptoms of depression. She denies any psychiatric hospitalizations. She participated in outpatient therapy at [facility] for several years. She is a returning patient to this clinician and has been seen intermittently for 2 to 3 years.  Patient reports increased grief and loss issues triggered by the upcoming birthday of her deceased son who died by suicide in 04/06/20.  His birthday is [birth year].  Current symptoms include crying spells, sadness, and tearfulness.  Patient last was seen via virtual visit about 2-3 weeks ago.  She reports continued  improved mood, increased motivation, and increased  behavioral activation since last session.  She continues to walk consistently 3 to 4 days a week and reports recently increasing the amount of time and distance she walks.  She reports recently meeting a new neighbor and initiating conversation.  She also attempted to contact another neighbor but reports no one came to the door.  She and her daughter continue to participate in activities together.  However, they were unable to meet yesterday on Mother's Day due to daughter's work schedule.  But, patient reports managing this very well.  She denies being overwhelmed with sadness and reports enjoying watching [employer] Mother's Day movies that day.  She has continued to perform household task and take care of her pet.  She is very excited today as she recently ordered a patio set and it is being delivered today.  Patient is looking forward to decorating her deck and has planned various activities to do on her deck.  She expresses hope, optimism, and excitement.    suicidal/Homicidal: Nowithout intent/plan  Therapist Response: Reviewed symptoms, praised and reinforced patient's increased behavioral activation/socialization, discussed effects, praised and reinforced patient's efforts to initiate contact with her neighbors, discussed effects, praised and reinforced patient's involvement with her daughter, discussed maintaining consistent efforts, also encouraged patient to continue to consider other possible social contacts  Plan: Return again in 2  weeks   Diagnosis: Axis I: MDD, Recurrent, Moderate    Axis II: No diagnosis  Collaboration of Care: Psychiatrist AEB patient working with psychiatrist Dr. Vanetta Shawl, clinician reviewing chart.  Patient/Guardian was advised  Release of Information must be obtained prior to any record release in order to collaborate their care with an outside provider. Patient/Guardian was advised if they have not already done so to contact the registration department to sign all  necessary forms in order for [place] to release information regarding their care.   Consent: Patient/Guardian gives verbal consent for treatment and assignment of benefits for services provided during this visit. Patient/Guardian expressed understanding and agreed to proceed.   Rachel Rison E Margo Lama, LCSW   ,

## 2022-10-15 IMAGING — DX DG TMJ OPEN & CLOSE BILAT
5 series · 5 of 5 positions shown · non-contrast
Comparison: None.

CLINICAL DATA: Left temporomandibular joint arthralgia

EXAM:
TEMPOROMANDIBULAR JOINTS

[tm-joint open (1 of 2)]
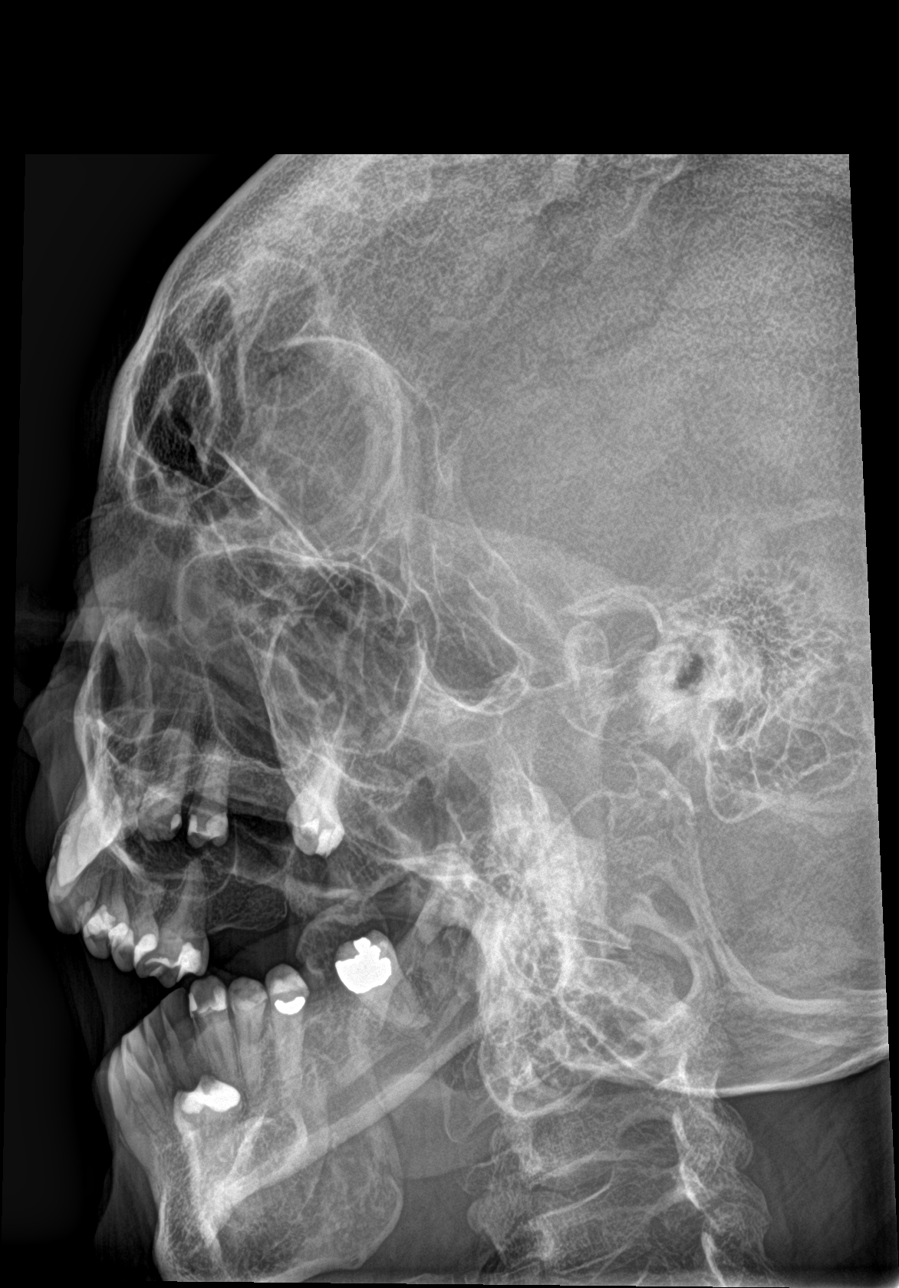

[tm-joint close (1 of 2)]
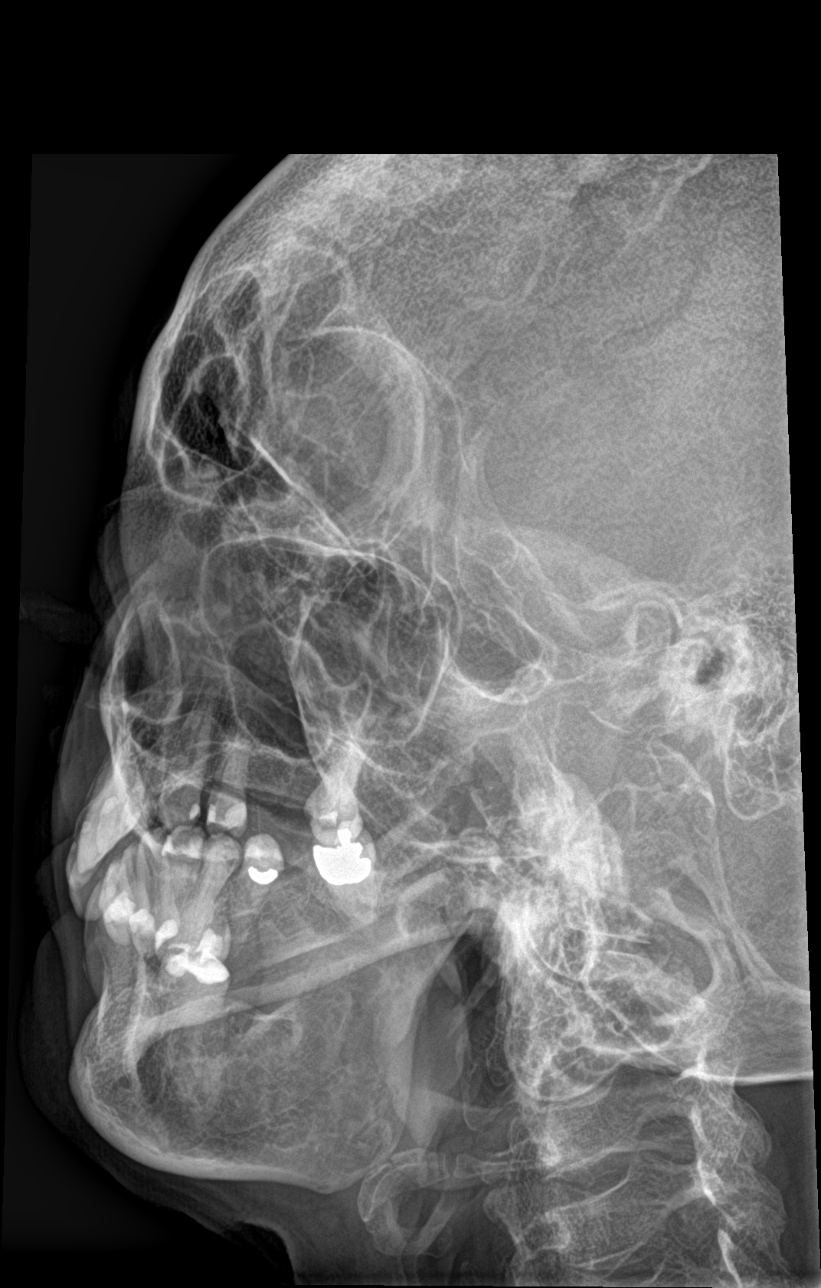

[tm-joint open (2 of 2)]
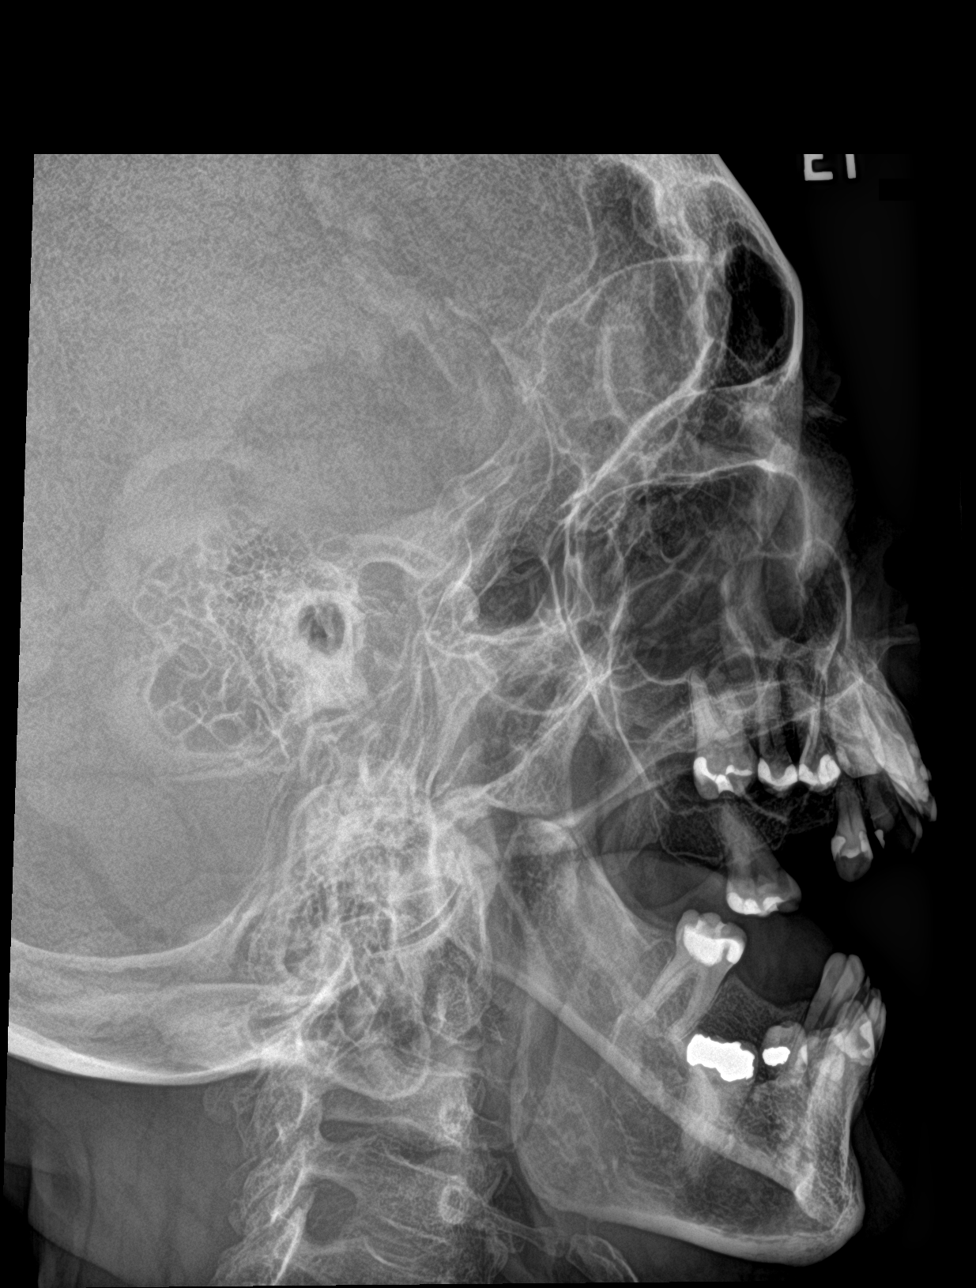

[tm-joint close (2 of 2)]
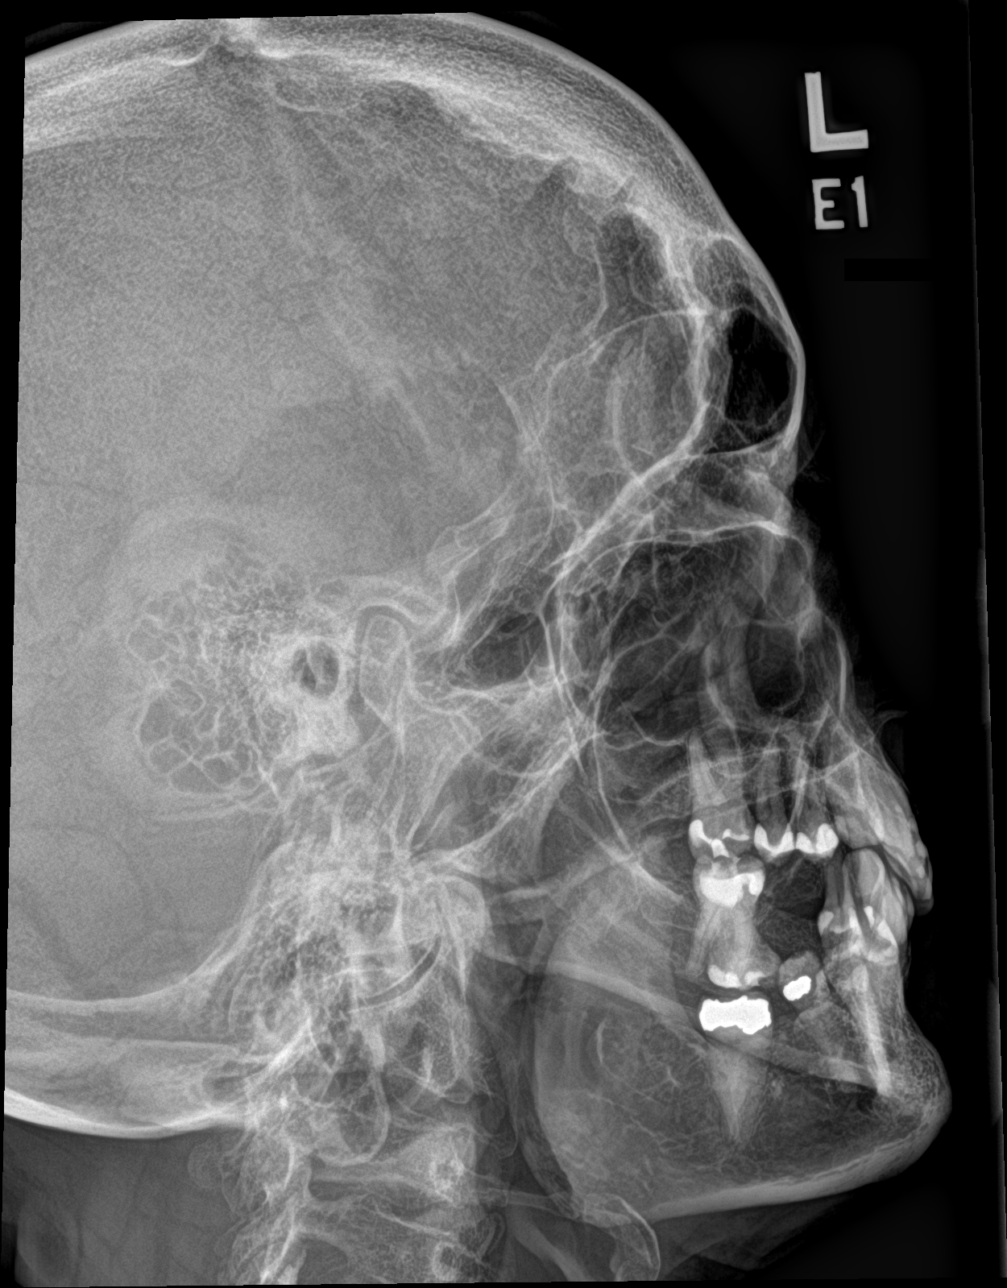

[skull townes]
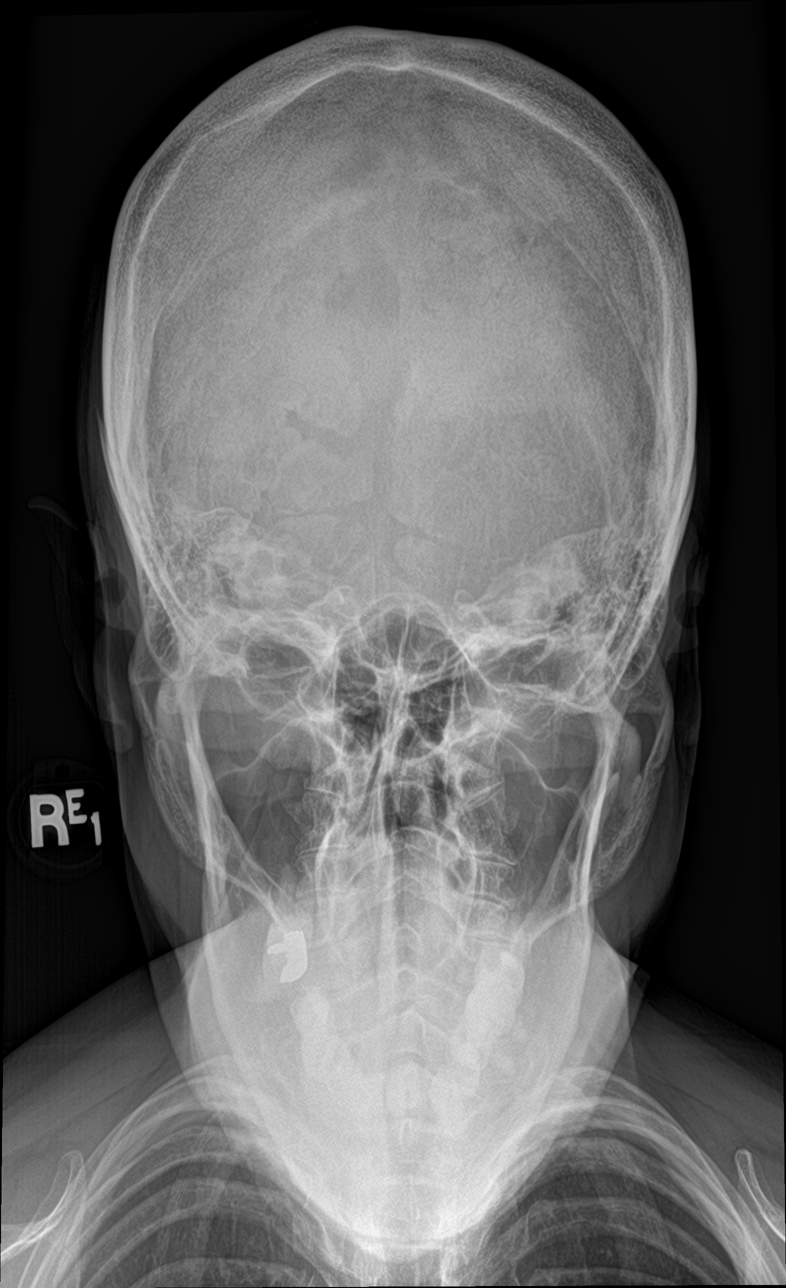

[5 of 5 positions shown; findings below may reference images not displayed]

FINDINGS: Albrecht and bilateral oblique views of the temporomandibular joints
are obtained, in the open and closed mouth positions. Alignment is
anatomic. Joint spaces are symmetrical and well preserved. There are
no acute bony abnormalities.
IMPRESSION: 1. Unremarkable bilateral temporomandibular joints.

## 2022-10-15 IMAGING — DX DG CERVICAL SPINE 2 OR 3 VIEWS
4 series · 4 of 4 positions shown · non-contrast
Comparison: None

CLINICAL DATA: Next stiffness for 1 month

EXAM:
CERVICAL SPINE - 2-3 VIEW

[c-spine lat]
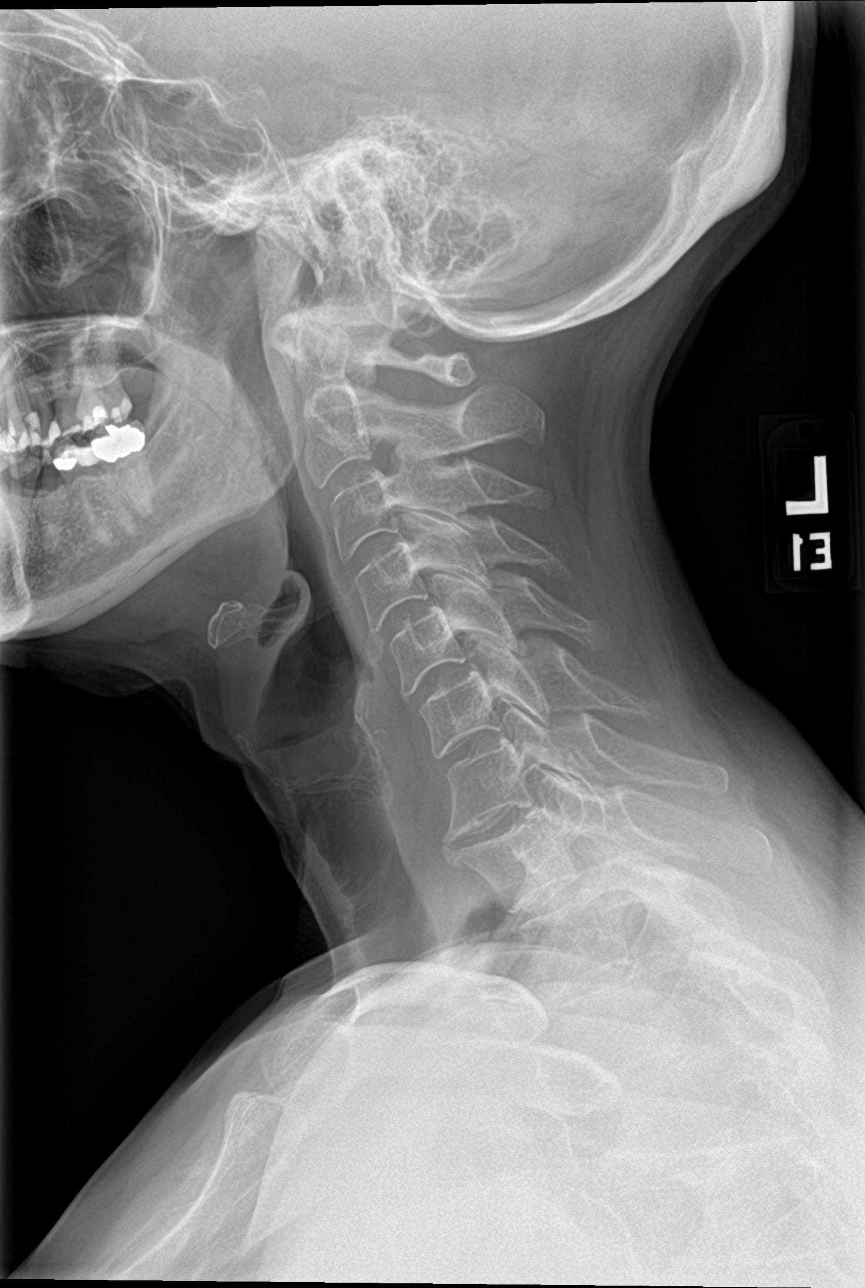

[c-spine ap]
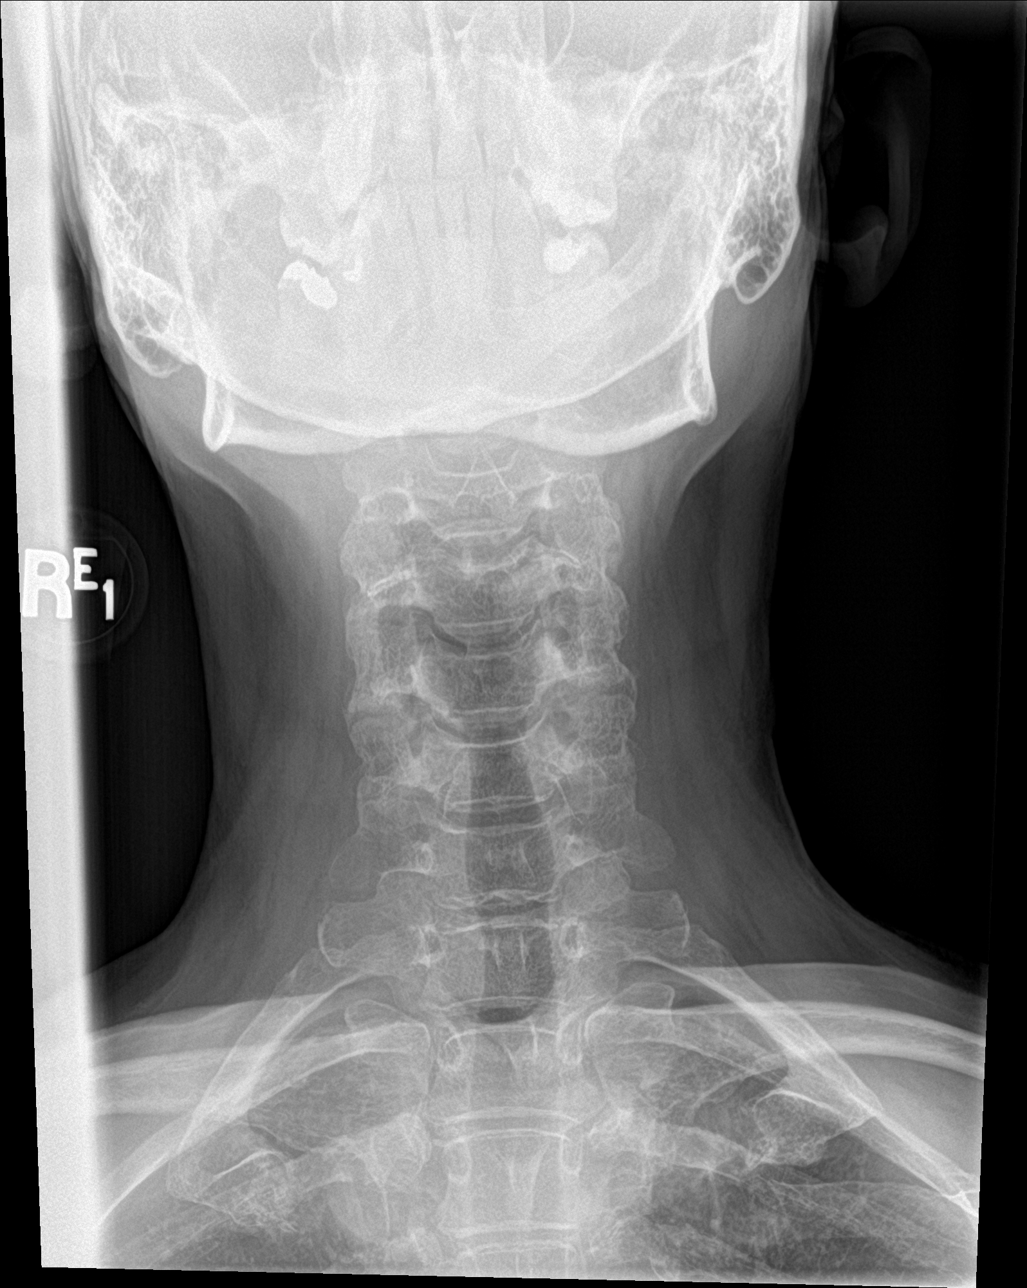

[c-spine open mouth (1 of 2)]
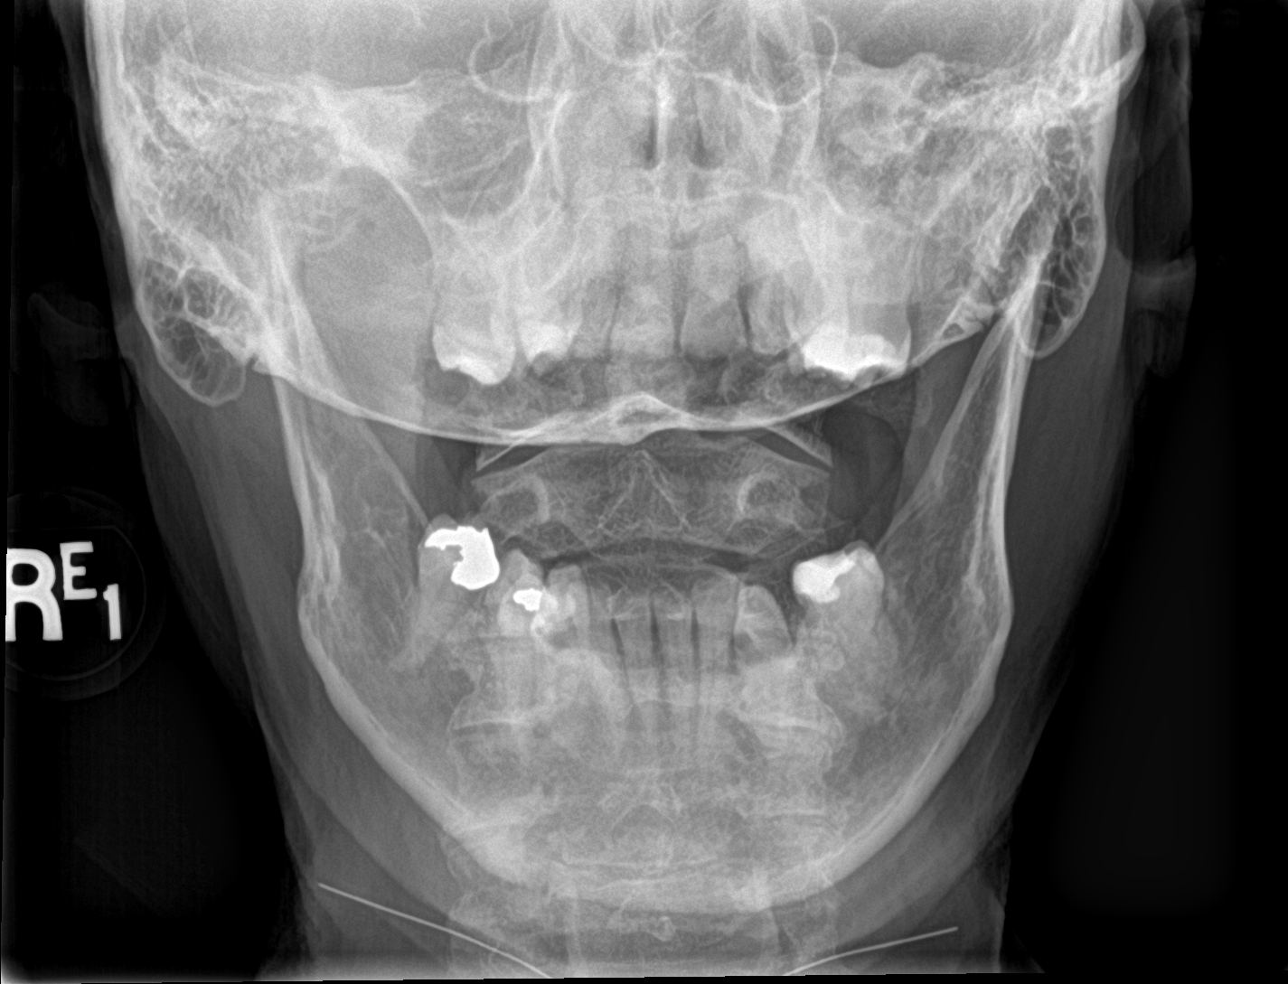

[c-spine open mouth (2 of 2)]
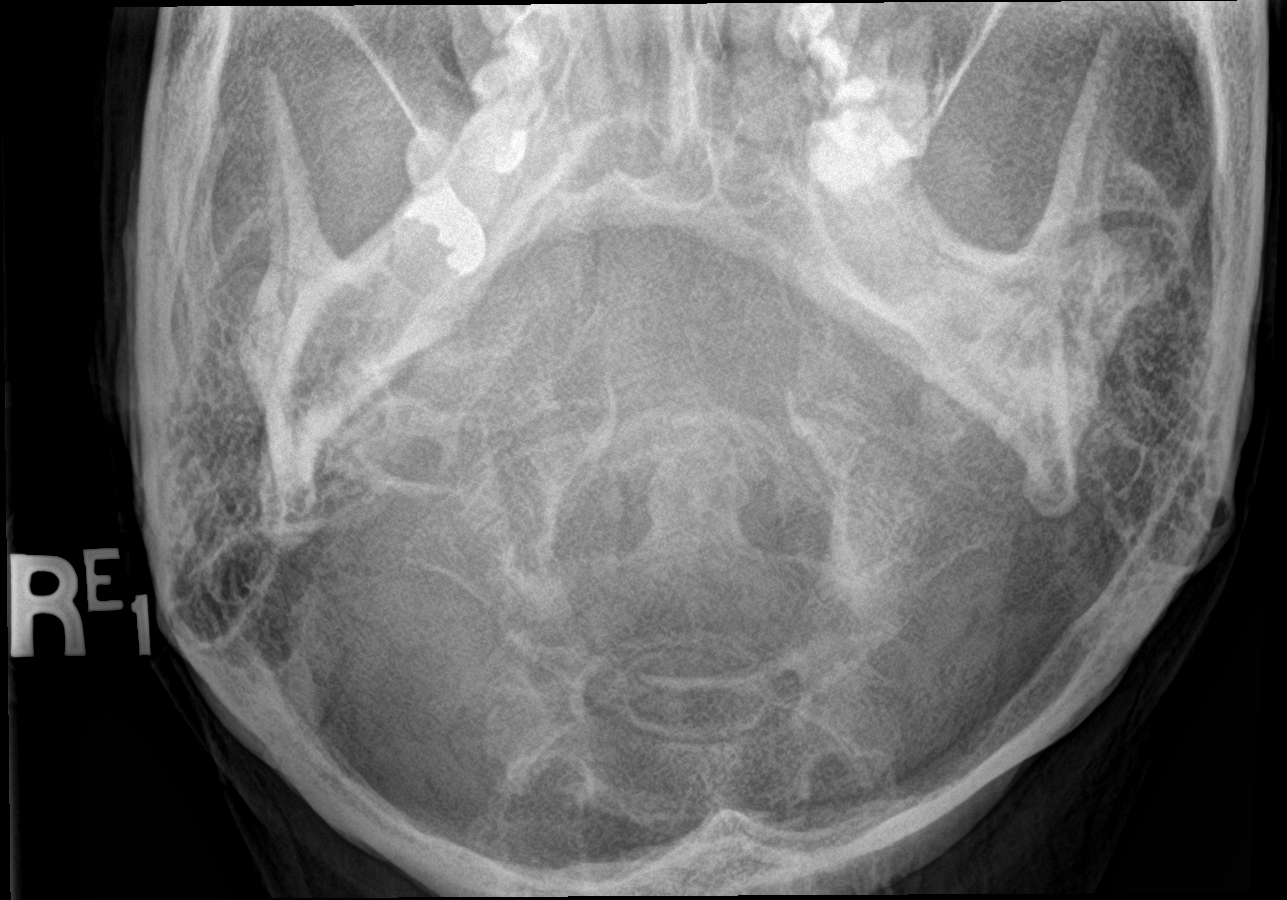

[4 of 4 positions shown; findings below may reference images not displayed]

FINDINGS: Frontal and lateral views of the cervical spine are obtained.
Alignment is anatomic to the cervicothoracic junction. There is
prominent spondylosis at C7-T1. Mild diffuse facet hypertrophy is
seen, greatest from C4 through C6. No acute fractures. The
prevertebral soft tissues are unremarkable. Lung apices are clear.
IMPRESSION: 1. No acute cervical spine fracture.
2. Mild facet hypertrophic changes of the mid cervical spine.
3. Moderate spondylosis at C7-T1.
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
## 2022-10-17 ENCOUNTER — Encounter: Payer: Self-pay | Admitting: Psychiatry

## 2022-10-17 ENCOUNTER — Telehealth (INDEPENDENT_AMBULATORY_CARE_PROVIDER_SITE_OTHER): Payer: Medicaid Other | Admitting: Psychiatry

## 2022-10-17 DIAGNOSIS — F4381 Prolonged grief disorder: Secondary | ICD-10-CM | POA: Diagnosis not present

## 2022-10-17 DIAGNOSIS — F330 Major depressive disorder, recurrent, mild: Secondary | ICD-10-CM | POA: Diagnosis not present

## 2022-10-17 DIAGNOSIS — F411 Generalized anxiety disorder: Secondary | ICD-10-CM | POA: Diagnosis not present

## 2022-10-17 MED ORDER — CLONAZEPAM 1 MG PO TABS: 1.0000 mg | ORAL_TABLET | Freq: Two times a day (BID) | ORAL | 1 refills | Status: DC

## 2022-10-17 NOTE — Patient Instructions (Addendum)
Continue venlafaxine 75 mg daily  Continue bupropion 450 mg daily  Continue clonazepam 1 mg twice a day as needed for anxiety  Next appointment: 6/28 at 10 30

## 2022-11-03 ENCOUNTER — Ambulatory Visit (INDEPENDENT_AMBULATORY_CARE_PROVIDER_SITE_OTHER): Payer: Medicaid Other | Admitting: Psychiatry

## 2022-11-03 DIAGNOSIS — F4381 Prolonged grief disorder: Secondary | ICD-10-CM

## 2022-11-03 NOTE — Progress Notes (Signed)
Virtual Visit via Video Note  I connected with Rebecca Barrera on 11/03/22 at 1:07 PM EDT  by a video enabled telemedicine application and verified that I am speaking with the correct person using two identifiers.  Location: Patient: Home Provider: [facility] Outpatient  office    I discussed the limitations of evaluation and management by telemedicine and the availability of in person appointments. The patient expressed understanding and agreed to proceed.  I provided 35 minutes of non-face-to-face time during this encounter.   [name], LCSW  \                 THERAPIST PROGRESS NOTE     Session Time: Monday  11/03/2022 1:07 PM  - 1:42 PM   Participation Level: Active  Behavioral Response: CasualAlert/euthymic, pleasant   Type of Therapy: Individual Therapy  Treatment Goals addressed:    I want to be able to get through this (losing my ex-husband) move on, find peace, want my life back" AEB pt resuming normal interest/pleasure/involvement in activties 2x per week for 60 days per pt's report"Rebecca Barrera WILL PRACTICE BEHAVIORAL ACTIVATION SKILLS 2-3 TIMES PER WEEK FOR THE NEXT 12 WEEKS      Progress on Goals: progressing   Interventions: Supportive, CBT  Summary: Rebecca Barrera is a 62 y.o. female who initially is referred for services by psychiatrist Dr. Vanetta Shawl due to patient experiencing symptoms of depression. She denies any psychiatric hospitalizations. She participated in outpatient therapy at [facility] for several years. She is a returning patient to this clinician and has been seen intermittently for 2 to 3 years.  Patient reports increased grief and loss issues triggered by the upcoming birthday of her deceased son who died by suicide in 03-17-2020.  His birthday is [birth year] 30.  Current symptoms include crying spells, sadness, and tearfulness.  Patient last was seen via virtual visit about 2-3 weeks ago.  She reports continued  improved mood, increased motivation, and  increased behavioral activation since last session.  She continues to walk consistently 3 to 4 days a week. She reports increased social contact with her neighbors and cites recent incidents of talking to neighbors and children on the playground.  She continues to do activities with her daughter but reports this is limited due to daughter's work schedule.  Patient reports she is enjoying her new patio set and often sits on her deck.  She also reports increased contact with her daughter-in-law.  She continues to become tearful at times when discussing her deceased son but reports not being overwhelmed by this.  Patient is very pleased with her progress in treatment.   suicidal/Homicidal: Nowithout intent/plan  Therapist Response: Reviewed symptoms, praised and reinforced patient's increased behavioral activation/socialization, discussed effects, reviewed treatment plan, obtained patient's permission to electronically signed plan for patient as this was a virtual visit, discussed patient's progress and treatment, discussed next steps for treatment and stepdown plan to include 3 more sessions focusing on relapse prevention, began to discuss ways to increase social contacts, explored possibility of attending her church or going to [facility], developed plan with patient to contact [employer] regarding possible group classes, encouraged patient to maintain consistency regarding daily schedule and encouraged patient to review activity menu regarding other possible activities   Plan: Return again in 2  weeks   Diagnosis: Axis I: MDD, Recurrent, Moderate    Axis II: No diagnosis  Collaboration of Care: Psychiatrist AEB patient working with psychiatrist Dr. Vanetta Shawl, clinician reviewing chart.  Patient/Guardian was  advised Release of Information must be obtained prior to any record release in order to collaborate their care with an outside provider. Patient/Guardian was advised if they have not already done so to contact the  registration department to sign all necessary forms in order for [place] to release information regarding their care.   Consent: Patient/Guardian gives verbal consent for treatment and assignment of benefits for services provided during this visit. Patient/Guardian expressed understanding and agreed to proceed.   Marshayla Mitschke E Lenya Sterne, LCSW   ,

## 2022-11-05 ENCOUNTER — Telehealth: Payer: Self-pay | Admitting: Psychiatry

## 2022-11-05 NOTE — Telephone Encounter (Signed)
Reviewed lab.  TSH 2.320 06/2022

## 2022-11-17 ENCOUNTER — Ambulatory Visit (INDEPENDENT_AMBULATORY_CARE_PROVIDER_SITE_OTHER): Payer: Medicaid Other | Admitting: Psychiatry

## 2022-11-17 DIAGNOSIS — F4381 Prolonged grief disorder: Secondary | ICD-10-CM | POA: Diagnosis not present

## 2022-11-17 NOTE — Progress Notes (Signed)
Virtual Visit via Video Note  I connected with Rebecca Barrera on 11/17/22 at 3:11 PM EDT by a video enabled telemedicine application and verified that I am speaking with the correct person using two identifiers.  Location: Patient: Home Provider: [facility] Outpatient [place] office    I discussed the limitations of evaluation and management by telemedicine and the availability of in person appointments. The patient expressed understanding and agreed to proceed.  I provided 38 minutes of non-face-to-face time during this encounter.   [name], LCSW                THERAPIST PROGRESS NOTE     Session Time: Monday  11/17/2022 3:11 PM - 3:49 PM   Participation Level: Active  Behavioral Response: CasualAlert/euthymic, pleasant   Type of Therapy: Individual Therapy  Treatment Goals addressed:    I want to be able to get through this (losing my ex-husband) move on, find peace, want my life back" AEB pt resuming normal interest/pleasure/involvement in activties 2x per week for 60 days per pt's report"Rebecca Barrera WILL PRACTICE BEHAVIORAL ACTIVATION SKILLS 2-3 TIMES PER WEEK FOR THE NEXT 12 WEEKS      Progress on Goals: progressing   Interventions: Supportive, CBT  Summary: Rebecca Barrera is a 62 y.o. female who initially is referred for services by psychiatrist Dr. Vanetta Shawl due to patient experiencing symptoms of depression. She denies any psychiatric hospitalizations. She participated in outpatient therapy at [facility] for several years. She is a returning patient to this clinician and has been seen intermittently for 2 to 3 years.  Patient reports increased grief and loss issues triggered by the upcoming birthday of her deceased son who died by suicide in 04/13/20.  His birthday is [birth year] 30.  Current symptoms include crying spells, sadness, and tearfulness.  Patient last was seen via virtual visit about 2 ago.  She reports continued  improved mood, increased motivation, and increased behavioral  activation since last session.  She continues to walk consistently 3 to 4 days a week. She reports increased social contact with her daughter.  Per her report, they are making plans to schedule day trips.  She continues to enjoy her new patio set and sits on her deck.  She continues to have thoughts about son but still reports not being overwhelmed by this.  She acknowledges her feelings and then participates in distracting activities.  Patient still plans to expand her social network and says she plans to try to attend church.    Patient remains pleased with her progress in treatment.   suicidal/Homicidal: Nowithout intent/plan  Therapist Response: Reviewed symptoms, praised and reinforced patient's continued  behavioral activation/socialization, encouraged patient to follow through with her plans to expand her social network, praised and reinforced patient's use of healthy coping strategies to cope with thoughts and feelings related to son, discussed effects, discussed an understanding of mindfulness and the window of tolerance as being helpful to avoid relapse, provided psychoeducation on mindfulness and the window of tolerance, assisted patient identify signs of when she is out of the window of tolerance, assisted patient identify grounding techniques to use when outside of the window of tolerance    Plan: Return again in 2  weeks   Diagnosis: Axis I: MDD, Recurrent, Moderate    Axis II: No diagnosis  Collaboration of Care: Psychiatrist AEB patient working with psychiatrist Dr. Vanetta Shawl, clinician reviewing chart.  Patient/Guardian was advised Release of Information must be obtained prior to any record release in  order to collaborate their care with an outside provider. Patient/Guardian was advised if they have not already done so to contact the registration department to sign all necessary forms in order for [place] to release information regarding their care.   Consent: Patient/Guardian gives verbal  consent for treatment and assignment of benefits for services provided during this visit. Patient/Guardian expressed understanding and agreed to proceed.   Marcy Bogosian E Rosalynn Sergent, LCSW   ,
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
## 2022-11-28 ENCOUNTER — Encounter: Payer: Self-pay | Admitting: Psychiatry

## 2022-11-28 ENCOUNTER — Telehealth (INDEPENDENT_AMBULATORY_CARE_PROVIDER_SITE_OTHER): Payer: Medicaid Other | Admitting: Psychiatry

## 2022-11-28 DIAGNOSIS — F330 Major depressive disorder, recurrent, mild: Secondary | ICD-10-CM | POA: Diagnosis not present

## 2022-11-28 DIAGNOSIS — F4381 Prolonged grief disorder: Secondary | ICD-10-CM

## 2022-11-28 DIAGNOSIS — F411 Generalized anxiety disorder: Secondary | ICD-10-CM

## 2022-11-28 MED ORDER — VENLAFAXINE HCL ER 75 MG PO CP24: 75.0000 mg | ORAL_CAPSULE | Freq: Every day | ORAL | 5 refills | Status: DC

## 2022-11-28 MED ORDER — BUPROPION HCL ER (XL) 300 MG PO TB24
300.0000 mg | ORAL_TABLET | Freq: Every day | ORAL | 5 refills | Status: DC
Start: 2022-12-12 — End: 2023-05-20

## 2022-11-28 MED ORDER — CLONAZEPAM 1 MG PO TABS
1.0000 mg | ORAL_TABLET | Freq: Two times a day (BID) | ORAL | 0 refills | Status: DC | PRN
Start: 2022-12-28 — End: 2023-01-30

## 2022-11-28 NOTE — Patient Instructions (Signed)
Continue venlafaxine 75 mg daily (tremor, dizziness from 112.5 mg)  Continue bupropion 450 mg daily  Continue clonazepam 1 mg twice a day as needed for anxiety  Next appointment: 8/30 at 11 30, video

## 2022-12-01 ENCOUNTER — Ambulatory Visit (INDEPENDENT_AMBULATORY_CARE_PROVIDER_SITE_OTHER): Payer: Medicaid Other | Admitting: Psychiatry

## 2022-12-01 ENCOUNTER — Telehealth (HOSPITAL_COMMUNITY): Payer: Self-pay | Admitting: Psychiatry

## 2022-12-01 DIAGNOSIS — F4381 Prolonged grief disorder: Secondary | ICD-10-CM

## 2022-12-01 NOTE — Telephone Encounter (Signed)
Therapist attempted to contact patient via text through caregility platform for scheduled appointment, no response.  Therapist left message indicating attempt and requesting patient call office. ?

## 2022-12-01 NOTE — Progress Notes (Signed)
Virtual Visit via Video Note  I connected with Rebecca Barrera on 12/01/22 at 1:19 PM EDT by a video enabled telemedicine application and verified that I am speaking with the correct person using two identifiers.  Location: Patient: Home Provider: [facility] Outpatient [place] office    I discussed the limitations of evaluation and management by telemedicine and the availability of in person appointments. The patient expressed understanding and agreed to proceed.  I provided 34 minutes of non-face-to-face time during this encounter.   [name], LCSW                 THERAPIST PROGRESS NOTE     Session Time: Monday  12/01/2022 1:19 PM - 1:53 PM   Participation Level: Active  Behavioral Response: CasualAlert/euthymic, pleasant   Type of Therapy: Individual Therapy  Treatment Goals addressed:    I want to be able to get through this (losing my ex-husband) move on, find peace, want my life back" AEB pt resuming normal interest/pleasure/involvement in activties 2x per week for 60 days per pt's report"[place] WILL PRACTICE BEHAVIORAL ACTIVATION SKILLS 2-3 TIMES PER WEEK FOR THE NEXT 12 WEEKS      Progress on Goals: progressing   Interventions: Supportive, CBT  Summary: Rebecca Barrera is a 62 y.o. female who initially is referred for services by psychiatrist Dr. Vanetta Shawl due to patient experiencing symptoms of depression. She denies any psychiatric hospitalizations. She participated in outpatient therapy at [facility] for several years. She is a returning patient to this clinician and has been seen intermittently for 2 to 3 years.  Patient reports increased grief and loss issues triggered by the upcoming birthday of her deceased son who died by suicide in 2020/03/22.  His birthday is July 30.  Current symptoms include crying spells, sadness, and tearfulness.  Patient last was seen via virtual visit about 3 weeks ago.  She reports continued  improved mood, increased motivation, and increased  behavioral activation since last session.  She continues to walk consistently 3 to 4 days a week. She enjoys sitting on her deck. She reports continued social contact with her daughter.  She also has contacted the [facility] and is in the process of contacting transportation services for assistance in going to the [place]. Pt is pleased with her progress in treatment. She reports increased awareness of her window of tolerance and more easily recognizes when she is outside the window. She also reports successful use of grounding techniques when outside the window. Pt continues to miss son but reports now being able to share funny memories with daughter. suicidal/Homicidal: Nowithout intent/plan  Therapist Response: Reviewed symptoms, praised and reinforced patient's continued  behavioral activation/socialization, using reinforced patient's efforts to contact [employer] and secure transportation, encouraged patient to follow through with plans to attend the [employer], praised and reinforced patient's increased awareness of her window of tolerance and efforts to practice grounding techniques when outside window, discussed effects of use, discussed rationale for and assisted patient practice mindfulness activities to improve mindfulness skills, developed plan with patient to practice a mindfulness activity daily between sessions, processed patient's feelings about upcoming termination, will send patient mental health maintenance plan in preparation for next session. Plan: Return again in 2  weeks   Diagnosis: Axis I: MDD, Recurrent, Moderate    Axis II: No diagnosis  Collaboration of Care: Psychiatrist AEB patient working with psychiatrist Dr. Vanetta Shawl, clinician reviewing chart.  Patient/Guardian was advised Release of Information must be obtained prior to any record release  in order to collaborate their care with an outside provider. Patient/Guardian was advised if they have not already done so to contact the registration  department to sign all necessary forms in order for [place] to release information regarding their care.   Consent: Patient/Guardian gives verbal consent for treatment and assignment of benefits for services provided during this visit. Patient/Guardian expressed understanding and agreed to proceed.   Tysean Vandervliet E Katheryne Gorr, LCSW   ,

## 2022-12-18 ENCOUNTER — Ambulatory Visit: Payer: Medicaid Other | Admitting: Neurology

## 2022-12-19 ENCOUNTER — Encounter (INDEPENDENT_AMBULATORY_CARE_PROVIDER_SITE_OTHER): Payer: Medicaid Other

## 2022-12-19 DIAGNOSIS — F419 Anxiety disorder, unspecified: Secondary | ICD-10-CM | POA: Diagnosis not present

## 2022-12-22 ENCOUNTER — Other Ambulatory Visit: Payer: Self-pay | Admitting: Psychiatry

## 2022-12-22 MED ORDER — CLONAZEPAM 1 MG PO TABS: 1.0000 mg | ORAL_TABLET | Freq: Three times a day (TID) | ORAL | 0 refills | Status: DC | PRN

## 2022-12-22 NOTE — Telephone Encounter (Signed)
I've spent a total time of 5 minutes providing service to this patient-generated inquiry in the MyChart message   I have utilized the [place] Controlled Substances Reporting System (PMP AWARxE) to confirm adherence regarding the patient's medication. My review reveals appropriate prescription fills.

## 2023-01-01 ENCOUNTER — Ambulatory Visit (INDEPENDENT_AMBULATORY_CARE_PROVIDER_SITE_OTHER): Payer: Medicaid Other | Admitting: Psychiatry

## 2023-01-01 DIAGNOSIS — F4381 Prolonged grief disorder: Secondary | ICD-10-CM | POA: Diagnosis not present

## 2023-01-01 NOTE — Progress Notes (Signed)
Virtual Visit via Video Note  I connected with Rebecca Barrera on 01/01/23 at 2:02 PM EDT  by a video enabled telemedicine application and verified that I am speaking with the correct person using two identifiers.  Location: Patient: Home Provider: [facility] Outpatient [place] office    I discussed the limitations of evaluation and management by telemedicine and the availability of in person appointments. The patient expressed understanding and agreed to proceed.  I provided 41 minutes of non-face-to-face time during this encounter.   [name], LCSW                  THERAPIST PROGRESS NOTE     Session Time: Thursday 01/01/2023 2:02 PM - 2:43 PM   Participation Level: Active  Behavioral Response: CasualAlert/euthymic, pleasant   Type of Therapy: Individual Therapy  Treatment Goals addressed:    I want to be able to get through this (losing my ex-husband) move on, find peace, want my life back" AEB pt resuming normal interest/pleasure/involvement in activties 2x per week for 60 days per pt's report"[name] WILL PRACTICE BEHAVIORAL ACTIVATION SKILLS 2-3 TIMES PER WEEK FOR THE NEXT 12 WEEKS      Progress on Goals: progressing   Interventions: Supportive, CBT  Summary: Rebecca Barrera is a 62 y.o. female who initially is referred for services by psychiatrist Dr. Vanetta Shawl due to patient experiencing symptoms of depression. She denies any psychiatric hospitalizations. She participated in outpatient therapy at [facility] for several years. She is a returning patient to this clinician and has been seen intermittently for 2 to 3 years.  Patient reports increased grief and loss issues triggered by the upcoming birthday of her deceased son who died by suicide in March 30, 2020.  His birthday is July 30.  Current symptoms include crying spells, sadness, and tearfulness.  Patient last was seen via virtual visit about 4 weeks ago.  She reports continuing to do well since last session.  She has maintained  involvement in activities and positive self-care.  She states not having more interest in her appearance  and reports recently putting on making up like she used to prior to her son's death.  Patient verbalizes positive statements about self and states feeling better about self.  She is pleased with the way she coped and managed her deceased son's birthday last week.  She reports experiencing appropriate sadness but was not overwhelmed by this.  She reports acknowledging her feelings and then being able to manage the rest of her day by engaging in activities she enjoys.  She reports she has been practicing mindfulness skills and reports this has been very helpful in managing her emotions.  She reports continued socialization with her neighbors as well as with her daughter.  Patient is very pleased with her progress in therapy.  She states now feeling stronger since going through the grief process and expresses confidence in her ability to successfully use coping strategies.  Patient expresses hope and optimism about the future and her continued growth.    suicidal/Homicidal: Nowithout intent/plan  Therapist Response: praised and reinforced patient's continued  behavioral activation/socialization, praised and reinforced patient's efforts to practice mindfulness activities, assisted patient identify effects, praised and reinforced patient's use of healthy coping strategies, discussed patient's progress in therapy, assisted patient develop mental health maintenance plan, did termination, encouraged patient to contact this practice should she need psychotherapy services in the future,   Plan: Patient and therapist agreed to terminate as patient has met her.  Patient  will continue to see psychiatrist Dr. Vanetta Shawl for medication management.  Patient is encouraged to contact this practice should she need psychotherapy services in the future.  Diagnosis: Axis I: MDD,     Prolonged Grief Disorder     Axis II: No  diagnosis  Collaboration of Care: Psychiatrist AEB patient working with psychiatrist Dr. Vanetta Shawl, clinician reviewing chart.  Patient/Guardian was advised Release of Information must be obtained prior to any record release in order to collaborate their care with an outside provider. Patient/Guardian was advised if they have not already done so to contact the registration department to sign all necessary forms in order for [place] to release information regarding their care.   Consent: Patient/Guardian gives verbal consent for treatment and assignment of benefits for services provided during this visit. Patient/Guardian expressed understanding and agreed to proceed.   [name], LCSW   Outpatient Therapist Discharge Summary  Rebecca Barrera    12-07-1960   Admission Date: 12/27/2018 Discharge Date: 01/01/2023 Reason for Discharge: Pt accomplished goals Diagnosis:  Axis I:  Prolonged grief disorder   Comments:  Patient will continue to see psychiatrist Dr. Vanetta Shawl for medication management.  Patient is encouraged to contact this practice should she need psychotherapy services in the future.  Jasime Westergren E Celese Banner LCSW
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
## 2023-01-30 ENCOUNTER — Telehealth (INDEPENDENT_AMBULATORY_CARE_PROVIDER_SITE_OTHER): Payer: Medicaid Other | Admitting: Psychiatry

## 2023-01-30 ENCOUNTER — Encounter: Payer: Self-pay | Admitting: Psychiatry

## 2023-01-30 DIAGNOSIS — F4381 Prolonged grief disorder: Secondary | ICD-10-CM

## 2023-01-30 DIAGNOSIS — Z79899 Other long term (current) drug therapy: Secondary | ICD-10-CM

## 2023-01-30 DIAGNOSIS — F330 Major depressive disorder, recurrent, mild: Secondary | ICD-10-CM

## 2023-01-30 DIAGNOSIS — F419 Anxiety disorder, unspecified: Secondary | ICD-10-CM | POA: Diagnosis not present

## 2023-01-30 MED ORDER — CLONAZEPAM 1 MG PO TABS: 1.0000 mg | ORAL_TABLET | Freq: Two times a day (BID) | ORAL | 1 refills | Status: DC | PRN

## 2023-01-30 MED ORDER — CLONAZEPAM 1 MG PO TABS: 1.0000 mg | ORAL_TABLET | Freq: Three times a day (TID) | ORAL | 0 refills | Status: DC | PRN

## 2023-01-30 NOTE — Patient Instructions (Addendum)
Continue venlafaxine 75 mg daily  Continue bupropion 450 mg daily  Continue clonazepam 1 mg three times a day as needed for anxiety for a month, then reduce to  twice a day as needed for anxiety  Next appointment:10/30 at 11 am

## 2023-02-08 ENCOUNTER — Encounter: Payer: Self-pay | Admitting: Psychiatry

## 2023-02-08 LAB — URINE DRUG PANEL 7
Amphetamines, Urine: NEGATIVE ng/mL
Barbiturate Quant, Ur: NEGATIVE ng/mL
Benzodiazepine Quant, Ur: NEGATIVE ng/mL
Cannabinoid Quant, Ur: NEGATIVE ng/mL
Cocaine (Metab.): NEGATIVE ng/mL
Opiate Quant, Ur: NEGATIVE ng/mL
PCP Quant, Ur: NEGATIVE ng/mL

## 2023-03-09 ENCOUNTER — Ambulatory Visit (HOSPITAL_COMMUNITY): Payer: Medicaid Other | Admitting: Psychiatry

## 2023-03-17 ENCOUNTER — Ambulatory Visit: Payer: Medicaid Other | Admitting: Neurology
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
## 2023-03-30 ENCOUNTER — Other Ambulatory Visit: Payer: Self-pay | Admitting: Psychiatry

## 2023-03-30 DIAGNOSIS — F330 Major depressive disorder, recurrent, mild: Secondary | ICD-10-CM

## 2023-04-01 ENCOUNTER — Telehealth: Payer: Medicaid Other | Admitting: Psychiatry

## 2023-04-01 ENCOUNTER — Encounter: Payer: Self-pay | Admitting: Psychiatry

## 2023-04-01 DIAGNOSIS — F419 Anxiety disorder, unspecified: Secondary | ICD-10-CM

## 2023-04-01 DIAGNOSIS — F4381 Prolonged grief disorder: Secondary | ICD-10-CM | POA: Diagnosis not present

## 2023-04-01 DIAGNOSIS — F3341 Major depressive disorder, recurrent, in partial remission: Secondary | ICD-10-CM | POA: Diagnosis not present

## 2023-04-01 NOTE — Patient Instructions (Signed)
Continue venlafaxine 75 mg daily Continue bupropion 450 mg daily  Continue clonazepam 1 mg twice a day Next appointment:12/18 at 3 30

## 2023-04-03 ENCOUNTER — Encounter: Payer: Self-pay | Admitting: Neurology

## 2023-04-04 ENCOUNTER — Telehealth (HOSPITAL_COMMUNITY): Payer: Self-pay

## 2023-04-04 NOTE — Telephone Encounter (Signed)
prior Rebecca Barrera was submitted and pending

## 2023-04-04 NOTE — Telephone Encounter (Signed)
received fax that a prior auth was needed for the vraylar ?

## 2023-04-05 ENCOUNTER — Other Ambulatory Visit: Payer: Self-pay | Admitting: Psychiatry

## 2023-04-06 ENCOUNTER — Ambulatory Visit: Payer: Medicaid Other | Admitting: Neurology

## 2023-04-06 ENCOUNTER — Telehealth: Payer: Self-pay | Admitting: Neurology

## 2023-04-06 NOTE — Telephone Encounter (Signed)
4 cancelled appt since April 2024, 2 were cancelled the day of appointment.   Will ask office to discharge patient.  She may ask her pcp to refer her to different office for migraine management.

## 2023-04-06 NOTE — Telephone Encounter (Signed)
Please contact and verify with the pharmacy- there should be orders which she has not filled yet.

## 2023-04-06 NOTE — Telephone Encounter (Signed)
received a fax that prior auth for vraylar was approved from 04-04-2023 to 04-03-24

## 2023-04-06 NOTE — Telephone Encounter (Signed)
Called pharmacy as instructed by provider spoke to Zella Ball she stated that the patient has a refill on fill

## 2023-04-06 NOTE — Telephone Encounter (Signed)
Pt cancelled appt due to family issues

## 2023-04-08 ENCOUNTER — Encounter: Payer: Self-pay | Admitting: Neurology

## 2023-04-15 ENCOUNTER — Encounter: Payer: Self-pay | Admitting: Neurology
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
## 2023-05-20 ENCOUNTER — Encounter: Payer: Self-pay | Admitting: Psychiatry

## 2023-05-20 ENCOUNTER — Telehealth (INDEPENDENT_AMBULATORY_CARE_PROVIDER_SITE_OTHER): Payer: Medicaid Other | Admitting: Psychiatry

## 2023-05-20 DIAGNOSIS — F3341 Major depressive disorder, recurrent, in partial remission: Secondary | ICD-10-CM | POA: Diagnosis not present

## 2023-05-20 DIAGNOSIS — F4381 Prolonged grief disorder: Secondary | ICD-10-CM | POA: Diagnosis not present

## 2023-05-20 DIAGNOSIS — F419 Anxiety disorder, unspecified: Secondary | ICD-10-CM

## 2023-05-20 MED ORDER — VENLAFAXINE HCL ER 75 MG PO CP24: 75.0000 mg | ORAL_CAPSULE | Freq: Every day | ORAL | 5 refills | Status: DC

## 2023-05-20 MED ORDER — BUPROPION HCL ER (XL) 300 MG PO TB24: 300.0000 mg | ORAL_TABLET | Freq: Every day | ORAL | 5 refills | Status: DC

## 2023-05-20 MED ORDER — CLONAZEPAM 1 MG PO TABS: 1.0000 mg | ORAL_TABLET | Freq: Two times a day (BID) | ORAL | 1 refills | Status: DC

## 2023-05-20 NOTE — Patient Instructions (Signed)
Continue venlafaxine 75 mg daily  Continue bupropion 450 mg daily  Continue clonazepam 1 mg twice a day Next appointment:2/12 at 4 pm
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
## 2023-07-14 ENCOUNTER — Other Ambulatory Visit (HOSPITAL_COMMUNITY): Payer: Self-pay | Admitting: Family Medicine

## 2023-07-14 DIAGNOSIS — R0781 Pleurodynia: Secondary | ICD-10-CM

## 2023-07-15 ENCOUNTER — Encounter: Payer: Self-pay | Admitting: Psychiatry

## 2023-07-15 ENCOUNTER — Telehealth (INDEPENDENT_AMBULATORY_CARE_PROVIDER_SITE_OTHER): Payer: Medicaid Other | Admitting: Psychiatry

## 2023-07-15 DIAGNOSIS — F3341 Major depressive disorder, recurrent, in partial remission: Secondary | ICD-10-CM

## 2023-07-15 DIAGNOSIS — F419 Anxiety disorder, unspecified: Secondary | ICD-10-CM

## 2023-07-15 DIAGNOSIS — F4381 Prolonged grief disorder: Secondary | ICD-10-CM | POA: Diagnosis not present

## 2023-07-15 MED ORDER — CLONAZEPAM 1 MG PO TABS
1.0000 mg | ORAL_TABLET | Freq: Two times a day (BID) | ORAL | 1 refills | Status: DC
Start: 2023-08-07 — End: 2023-09-10

## 2023-07-15 NOTE — Patient Instructions (Signed)
Continue venlafaxine 75 mg daily  Continue bupropion 450 mg daily  Continue clonazepam 1 mg twice a day Next appointment: 4/10 at 1 pm

## 2023-07-21 ENCOUNTER — Ambulatory Visit: Payer: Medicaid Other | Admitting: Neurology

## 2023-07-24 ENCOUNTER — Ambulatory Visit (HOSPITAL_COMMUNITY)
Admission: RE | Admit: 2023-07-24 | Discharge: 2023-07-24 | Disposition: A | Discharge status: Home / Self Care | Payer: Medicaid Other | Source: Ambulatory Visit | Attending: Family Medicine | Admitting: Family Medicine

## 2023-07-24 DIAGNOSIS — R0781 Pleurodynia: Secondary | ICD-10-CM | POA: Diagnosis present

## 2023-07-27 NOTE — Telephone Encounter (Signed)
 The next schedule is supposed to be in person visit as she stated. Could you change the visit type, and notify the patient? Thanks.

## 2023-08-11 ENCOUNTER — Telehealth

## 2023-08-11 ENCOUNTER — Telehealth: Admitting: Physician Assistant

## 2023-08-11 DIAGNOSIS — B9689 Other specified bacterial agents as the cause of diseases classified elsewhere: Secondary | ICD-10-CM

## 2023-08-11 DIAGNOSIS — R051 Acute cough: Secondary | ICD-10-CM

## 2023-08-11 DIAGNOSIS — J019 Acute sinusitis, unspecified: Secondary | ICD-10-CM | POA: Diagnosis not present

## 2023-08-11 MED ORDER — ALEVE 220 MG PO TABS: 220.0000 mg | ORAL_TABLET | Freq: Two times a day (BID) | ORAL | 0 refills | Status: DC | PRN

## 2023-08-11 MED ORDER — FLUTICASONE PROPIONATE 50 MCG/ACT NA SUSP: 2.0000 | Freq: Every day | NASAL | 0 refills | Status: DC

## 2023-08-11 MED ORDER — BENZONATATE 100 MG PO CAPS: 100.0000 mg | ORAL_CAPSULE | Freq: Three times a day (TID) | ORAL | 0 refills | Status: DC | PRN

## 2023-08-11 MED ORDER — AMOXICILLIN-POT CLAVULANATE 875-125 MG PO TABS: 1.0000 | ORAL_TABLET | Freq: Two times a day (BID) | ORAL | 0 refills | Status: DC

## 2023-08-11 NOTE — Patient Instructions (Signed)
 [name], thank you for joining [name], [name] for today's virtual visit.  While this provider is not your primary care provider (PCP), if your PCP is located in our provider database this encounter information will be shared with them immediately following your visit.   A [place] MyChart account gives you access to today's visit and all your visits, tests, and labs performed at [facility] " click here if you don't have a [place] MyChart account or go to [email or web address]  Consent: (Patient) Rebecca Barrera provided verbal consent for this virtual visit at the beginning of the encounter.  Current Medications:  Current Outpatient Medications:    ALEVE 220 MG tablet, Take 1 tablet (220 mg total) by mouth 2 (two) times daily as needed. Patient prefers brand Aleve, Disp: 30 tablet, Rfl: 0   amoxicillin-clavulanate (AUGMENTIN) 875-125 MG tablet, Take 1 tablet by mouth 2 (two) times daily., Disp: 14 tablet, Rfl: 0   benzonatate (TESSALON) 100 MG capsule, Take 1-2 capsules (100-200 mg total) by mouth 3 (three) times daily as needed., Disp: 30 capsule, Rfl: 0   fluticasone (FLONASE) 50 MCG/ACT nasal spray, Place 2 sprays into both nostrils daily., Disp: 16 g, Rfl: 0   buPROPion (WELLBUTRIN XL) 150 MG 24 hr tablet, Take 1 tablet (150 mg total) by mouth daily. Total of 450 mg daily. Take along with 300 mg tab, Disp: 30 tablet, Rfl: 5   buPROPion (WELLBUTRIN XL) 300 MG 24 hr tablet, Take 1 tablet (300 mg total) by mouth daily. Total of 450 mg daily. Take along with 150 mg tab, Disp: 30 tablet, Rfl: 5   clonazePAM (KLONOPIN) 1 MG tablet, Take 1 tablet (1 mg total) by mouth 2 (two) times daily., Disp: 60 tablet, Rfl: 1   linaclotide (LINZESS) 72 MCG capsule, Take 72 mcg by mouth daily before breakfast., Disp: , Rfl:    Multiple Vitamins-Minerals (CENTRUM SILVER 50+WOMEN PO), Take by mouth daily., Disp: , Rfl:    pravastatin (PRAVACHOL) 20 MG tablet, Take 20 mg by  mouth daily., Disp: , Rfl:    promethazine (PHENERGAN) 25 MG tablet, Take 1 tablet (25 mg total) by mouth every 6 (six) hours as needed for nausea., Disp: 30 tablet, Rfl: 0   rizatriptan (MAXALT) 10 MG tablet, Take 10 mg by mouth as needed for migraine. May repeat in 2 hours if needed, Disp: , Rfl:    venlafaxine XR (EFFEXOR-XR) 75 MG 24 hr capsule, Take 1 capsule (75 mg total) by mouth daily with breakfast., Disp: 30 capsule, Rfl: 5   Medications ordered in this encounter:  Meds ordered this encounter  Medications   amoxicillin-clavulanate (AUGMENTIN) 875-125 MG tablet    Sig: Take 1 tablet by mouth 2 (two) times daily.    Dispense:  14 tablet    Refill:  0    Supervising Provider:   Merrilee Jansky [[id number]]   fluticasone (FLONASE) 50 MCG/ACT nasal spray    Sig: Place 2 sprays into both nostrils daily.    Dispense:  16 g    Refill:  0    Supervising Provider:   Merrilee Jansky [[id number]]   benzonatate (TESSALON) 100 MG capsule    Sig: Take 1-2 capsules (100-200 mg total) by mouth 3 (three) times daily as needed.    Dispense:  30 capsule    Refill:  0    Supervising Provider:   LAMPTEY, PHILIP O [[id number]]   ALEVE 220 MG tablet    Sig: Take 1  tablet (220 mg total) by mouth 2 (two) times daily as needed. Patient prefers brand Aleve    Dispense:  30 tablet    Refill:  0    Supervising Provider:   Merrilee Jansky [[id number]]     *If you need refills on other medications prior to your next appointment, please contact your pharmacy*  Follow-Up: Call back or seek an in-person evaluation if the symptoms worsen or if the condition fails to improve as anticipated.  [facility] [phone]  Other Instructions Sinus Infection, Adult A sinus infection, also called sinusitis, is inflammation of your sinuses. Sinuses are hollow spaces in the bones around your face. Your sinuses are located: Around your eyes. In the middle of your forehead. Behind your nose. In your  cheekbones. Mucus normally drains out of your sinuses. When your nasal tissues become inflamed or swollen, mucus can become trapped or blocked. This allows bacteria, viruses, and fungi to grow, which leads to infection. Most infections of the sinuses are caused by a virus. A sinus infection can develop quickly. It can last for up to 4 weeks (acute) or for more than 12 weeks (chronic). A sinus infection often develops after a cold. What are the causes? This condition is caused by anything that creates swelling in the sinuses or stops mucus from draining. This includes: Allergies. Asthma. Infection from bacteria or viruses. Deformities or blockages in your nose or sinuses. Abnormal growths in the nose (nasal polyps). Pollutants, such as chemicals or irritants in the air. Infection from fungi. This is rare. What increases the risk? You are more likely to develop this condition if you: Have a weak body defense system (immune system). Do a lot of swimming or diving. Overuse nasal sprays. Smoke. What are the signs or symptoms? The main symptoms of this condition are pain and a feeling of pressure around the affected sinuses. Other symptoms include: Stuffy nose or congestion that makes it difficult to breathe through your nose. Thick yellow or greenish drainage from your nose. Tenderness, swelling, and warmth over the affected sinuses. A cough that may get worse at night. Decreased sense of smell and taste. Extra mucus that collects in the throat or the back of the nose (postnasal drip) causing a sore throat or bad breath. Tiredness (fatigue). Fever. How is this diagnosed? This condition is diagnosed based on: Your symptoms. Your medical history. A physical exam. Tests to find out if your condition is acute or chronic. This may include: Checking your nose for nasal polyps. Viewing your sinuses using a device that has a light (endoscope). Testing for allergies or bacteria. Imaging tests,  such as an MRI or CT scan. In rare cases, a bone biopsy may be done to rule out more serious types of fungal sinus disease. How is this treated? Treatment for a sinus infection depends on the cause and whether your condition is chronic or acute. If caused by a virus, your symptoms should go away on their own within 10 days. You may be given medicines to relieve symptoms. They include: Medicines that shrink swollen nasal passages (decongestants). A spray that eases inflammation of the nostrils (topical intranasal corticosteroids). Rinses that help get rid of thick mucus in your nose (nasal saline washes). Medicines that treat allergies (antihistamines). Over-the-counter pain relievers. If caused by bacteria, your health care provider may recommend waiting to see if your symptoms improve. Most bacterial infections will get better without antibiotic medicine. You may be given antibiotics if you have:  A severe infection. A weak immune system. If caused by narrow nasal passages or nasal polyps, surgery may be needed. Follow these instructions at home: Medicines Take, use, or apply over-the-counter and prescription medicines only as told by your health care provider. These may include nasal sprays. If you were prescribed an antibiotic medicine, take it as told by your health care provider. Do not stop taking the antibiotic even if you start to feel better. Hydrate and humidify  Drink enough fluid to keep your urine pale yellow. Staying hydrated will help to thin your mucus. Use a cool mist humidifier to keep the humidity level in your home above 50%. Inhale steam for 10-15 minutes, 3-4 times a day, or as told by your health care provider. You can do this in the bathroom while a hot shower is running. Limit your exposure to cool or dry air. Rest Rest as much as possible. Sleep with your head raised (elevated). Make sure you get enough sleep each night. General instructions  Apply a warm, moist  washcloth to your face 3-4 times a day or as told by your health care provider. This will help with discomfort. Use nasal saline washes as often as told by your health care provider. Wash your hands often with soap and water to reduce your exposure to germs. If soap and water are not available, use hand sanitizer. Do not smoke. Avoid being around people who are smoking (secondhand smoke). Keep all follow-up visits. This is important. Contact a health care provider if: You have a fever. Your symptoms get worse. Your symptoms do not improve within 10 days. Get help right away if: You have a severe headache. You have persistent vomiting. You have severe pain or swelling around your face or eyes. You have vision problems. You develop confusion. Your neck is stiff. You have trouble breathing. These symptoms may be an emergency. Get help right away. Call 911. Do not wait to see if the symptoms will go away. Do not drive yourself to the hospital. Summary A sinus infection is soreness and inflammation of your sinuses. Sinuses are hollow spaces in the bones around your face. This condition is caused by nasal tissues that become inflamed or swollen. The swelling traps or blocks the flow of mucus. This allows bacteria, viruses, and fungi to grow, which leads to infection. If you were prescribed an antibiotic medicine, take it as told by your health care provider. Do not stop taking the antibiotic even if you start to feel better. Keep all follow-up visits. This is important. This information is not intended to replace advice given to you by your health care provider. Make sure you discuss any questions you have with your health care provider. Document Revised: 04/23/2021 Document Reviewed: 04/23/2021 Elsevier Patient Education  2024 [employer] Inc.   If you have been instructed to have an in-person evaluation today at a local Urgent Care facility, please use the link below. It will take you to a  list of all of our available [facility], including address, phone number and hours of operation. Please do not delay care.  [facility]  If you or a family member do not have a primary care provider, use the link below to schedule a visit and establish care. When you choose a [facility] primary care physician or advanced practice provider, you gain a long-term partner in health. Find a Primary Care Provider  Learn more about [facility]'s in-office and virtual care options: [facility] - Get Care Now

## 2023-08-11 NOTE — Progress Notes (Signed)
 Virtual Visit Consent   [name], you are scheduled for a virtual visit with a [facility] provider today. Just as with appointments in the office, your consent must be obtained to participate. Your consent will be active for this visit and any virtual visit you may have with one of our providers in the next 365 days. If you have a MyChart account, a copy of this consent can be sent to you electronically.  As this is a virtual visit, video technology does not allow for your provider to perform a traditional examination. This may limit your provider's ability to fully assess your condition. If your provider identifies any concerns that need to be evaluated in person or the need to arrange testing (such as labs, EKG, etc.), we will make arrangements to do so. Although advances in technology are sophisticated, we cannot ensure that it will always work on either your end or our end. If the connection with a video visit is poor, the visit may have to be switched to a telephone visit. With either a video or telephone visit, we are not always able to ensure that we have a secure connection.  By engaging in this virtual visit, you consent to the provision of healthcare and authorize for your insurance to be billed (if applicable) for the services provided during this visit. Depending on your insurance coverage, you may receive a charge related to this service.  I need to obtain your verbal consent now. Are you willing to proceed with your visit today? Rebecca Barrera has provided verbal consent on 08/11/2023 for a virtual visit (video or telephone). [name], [name]  Date: 08/11/2023 12:14 PM   Virtual Visit via Video Note   I, [name], connected with  Rebecca Barrera  ([id number], 1961-03-22) on 08/11/23 at 12:00 PM EDT by a video-enabled telemedicine application and verified that I am speaking with the correct person using two identifiers.  Location: Patient: Virtual Visit Location  Patient: Home Provider: Virtual Visit Location Provider: Home Office   I discussed the limitations of evaluation and management by telemedicine and the availability of in person appointments. The patient expressed understanding and agreed to proceed.    History of Present Illness: Rebecca Barrera is a 63 y.o. who identifies as a female who was assigned female at birth, and is being seen today for sinus congestion.  HPI: Sinusitis This is a new problem. The current episode started 1 to 4 weeks ago. The problem has been gradually worsening since onset. There has been no fever. The pain is moderate. Associated symptoms include congestion, coughing (from post nasal drainage), ear pain (fullness and pressure), headaches, neck pain, sinus pressure and swollen glands. Pertinent negatives include no chills, diaphoresis, hoarse voice or sore throat. Treatments tried: ice packs and heat to neck, advil, tylenol. The treatment provided no relief.     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 Patient prefers brand Aleve, Disp: 30 tablet, Rfl: 0   amoxicillin-clavulanate (AUGMENTIN) 875-125 MG tablet, Take 1 tablet  by mouth 2 (two) times daily., Disp: 14 tablet, Rfl: 0   benzonatate (TESSALON) 100 MG capsule, Take 1-2 capsules (100-200 mg total) by mouth 3 (three) times daily as needed., Disp: 30 capsule, Rfl: 0   fluticasone (FLONASE) 50 MCG/ACT nasal spray, Place 2 sprays into both nostrils daily., Disp: 16 g, Rfl: 0   buPROPion (WELLBUTRIN XL) 150 MG 24 hr tablet, Take 1 tablet (150 mg total) by mouth daily. Total of 450 mg daily. Take along with 300 mg tab, Disp: 30 tablet, Rfl: 5   buPROPion (WELLBUTRIN XL) 300 MG 24 hr tablet, Take 1 tablet (300 mg total) by mouth daily. Total of 450 mg daily. Take along with 150 mg tab, Disp: 30 tablet, Rfl: 5   clonazePAM (KLONOPIN) 1 MG tablet, Take 1 tablet (1 mg total) by mouth 2 (two) times daily., Disp: 60 tablet, Rfl: 1   linaclotide (LINZESS) 72 MCG capsule, Take 72 mcg by mouth daily before breakfast., Disp: , Rfl:    Multiple Vitamins-Minerals (CENTRUM SILVER 50+WOMEN PO), Take by mouth daily., Disp: , Rfl:    pravastatin (PRAVACHOL) 20 MG tablet, Take 20 mg by mouth daily., Disp: , Rfl:    promethazine (PHENERGAN) 25 MG tablet, Take 1 tablet (25 mg total) by mouth every 6 (six) hours as needed for nausea., Disp: 30 tablet, Rfl: 0   rizatriptan (MAXALT) 10 MG tablet, Take 10 mg by mouth as needed for migraine. May repeat in 2 hours if needed, Disp: , Rfl:    venlafaxine XR (EFFEXOR-XR) 75 MG 24 hr capsule, Take 1 capsule (75 mg total) by mouth daily with breakfast., Disp: 30 capsule, Rfl: 5  Observations/Objective: Patient is well-developed, well-nourished in no acute distress.  Resting comfortably at home.  Head is normocephalic, atraumatic.  Patient is moving head and neck while talking (low suspicion for meningitis since moving and no fever) No labored breathing.  Speech is clear and coherent with logical content.  Patient is alert and oriented at baseline.    Assessment and Plan: 1. Acute bacterial sinusitis (Primary) - amoxicillin-clavulanate  (AUGMENTIN) 875-125 MG tablet; Take 1 tablet by mouth 2 (two) times daily.  Dispense: 14 tablet; Refill: 0 - fluticasone (FLONASE) 50 MCG/ACT nasal spray; Place 2 sprays into both nostrils daily.  Dispense: 16 g; Refill: 0 - ALEVE 220 MG tablet; Take 1 tablet (220 mg total) by mouth 2 (two) times daily as needed. Patient prefers brand Aleve  Dispense: 30 tablet; Refill: 0  2. Acute cough - benzonatate (TESSALON) 100 MG capsule; Take 1-2 capsules (100-200 mg total) by mouth 3 (three) times daily as needed.  Dispense: 30 capsule; Refill: 0  - Worsening symptoms that have not responded to OTC medications.  - Will give Augmentin and Flonase - Aleve for neck pain - Tessalon perles for cough - Continue allergy medications.  - Steam and humidifier can help - Stay well hydrated and get plenty of rest.  - Seek in person evaluation if no symptom improvement or if symptoms worsen   Follow Up Instructions: I discussed the assessment and treatment plan with the patient. The patient was provided an  opportunity to ask questions and all were answered. The patient agreed with the plan and demonstrated an understanding of the instructions.  A copy of instructions were sent to the patient via MyChart unless otherwise noted below.    The patient was advised to call back or seek an in-person evaluation if the symptoms worsen or if the condition fails to improve as anticipated.    [name], [name]

## 2023-08-17 NOTE — Telephone Encounter (Signed)
 Could you please confirm that the April appointment is set as an in-person visit and notify the patient? This is her second time inquiring, fyi. Thanks!

## 2023-08-18 ENCOUNTER — Ambulatory Visit (HOSPITAL_COMMUNITY)
Admission: RE | Admit: 2023-08-18 | Discharge: 2023-08-18 | Disposition: A | Discharge status: Home / Self Care | Source: Ambulatory Visit | Attending: Family Medicine | Admitting: Family Medicine

## 2023-08-18 ENCOUNTER — Other Ambulatory Visit (HOSPITAL_COMMUNITY): Payer: Self-pay | Admitting: Family Medicine

## 2023-08-18 DIAGNOSIS — M542 Cervicalgia: Secondary | ICD-10-CM

## 2023-08-18 DIAGNOSIS — Z1231 Encounter for screening mammogram for malignant neoplasm of breast: Secondary | ICD-10-CM

## 2023-08-26 NOTE — Progress Notes (Unsigned)
 NEUROLOGY CONSULTATION NOTE  Rebecca Barrera MRN: [id number] DOB: [birth year]  Referring provider: Kirstie Peri, MD Primary care provider: Kirstie Peri, MD  Reason for consult:  migraines  Assessment/Plan:   Migraine without aura, without status migrainosus, not intractable Cervicalgia  Migraine prevention:  Will defer for now as they are not frequent. Migraine rescue:  Will provide samples of Nurtec to take alone or in addition to rizatriptan.  Promethazine for nausea Limit use of pain relievers to no more than 2 days out of week to prevent risk of rebound or medication-overuse headache. Keep headache diary Consider physical therapy for neck pain Follow up 6 months.    Subjective:  Rebecca Barrera is a 63 year old right-handed female with HTN, HLD, Bipolar disorder, tremor, depression and anxiety who presents for migraines.  History supplemented by referring provider's note.  Onset:  81-93 years old Location:  bilateral temples, bilateral occipital, bilateral retro-orbital Quality:  squeezing, "vice", pressure Intensity:  6-7/10 Aura:  absent Prodrome:  absent Associated symptoms:  Nausea, photophobia, phonophobia, osmophobia.  She denies associated vomiting, visual disturbance, neck pain, dizziness, unilateral numbness or weakness. Duration:  2 days with rizatriptan Frequency:  1 to 2 times a month. Frequency of abortive medication: 2 to 4 days a month Triggers:  stress, chocolate, caffeine Relieving factors:  Maxalt Activity:  aggravates  Remote CT head on 08/13/2001 to assess headache was personally reviewed and was normal.  Imaging of TMJs on 01/25/2021 was unremarkable.    Past NSAIDS/analgesics:  Fioricet, naproxen, tramadol Past abortive triptans:  sumatriptan 100mg , sumatriptan 85mg -naproxen 500mg  Past abortive ergotamine:  none Past muscle relaxants:  none Past anti-emetic:  none Past antihypertensive medications:  lisinopril (caused hypotension) Past antidepressant  medications:  nortriptyline, sertraline, duloxetine, escitalopram, Trintellix, mirtazapine Past anticonvulsant medications:  none Past anti-CGRP:  none Past vitamins/Herbal/Supplements:  none Past antihistamines/decongestants:  loratadine, Flonase Other past therapies:  none  Current NSAIDS/analgesics:  Aleve (usually for neck) Current triptans:  rizatriptan-MLT 10mg  Current ergotamine:  none Current anti-emetic:  promethazine 25mg  Current muscle relaxants:  none Current Antihypertensive medications:  none Current Antidepressant medications:  venlafaxine XR 75mg  daily, bupropion XL 300mg  daily Current Anticonvulsant medications:  none Current anti-CGRP:  none Current Antihistamines/Decongestants:  Flonase Other medications:  clonazepam   Caffeine:  None Depression:  stable; Anxiety:  stable Other pain:  hip pain, arm pain, TMJ dysfunction.  Recent onset of neck stiffness.  Cervical X-ray on 08/18/2023 showed mild degenerative joint changes at C7-T1.   Sleep hygiene:  good.  Aggravated by neck pain.  Uses orthopedic pillows Family history of headache:  mom      PAST MEDICAL HISTORY: Past Medical History:  Diagnosis Date   Anemia    Anxiety    Depression    Hiatal hernia    High cholesterol    Leg pain    Migraine    Migraines    Panic attacks    Varicose veins     PAST SURGICAL HISTORY: Past Surgical History:  Procedure Laterality Date   COLONOSCOPY  2008   Dr. Karilyn Cota   ESOPHAGOGASTRODUODENOSCOPY  2008   Dr. Rehman--> superficial ulceration at the GE junction a large hiatal hernia with dependent segment on the left side with food debris and coffee-ground material, swollen and erythematous folds, few antral erosions.   ESOPHAGOGASTRODUODENOSCOPY  02/08/2008   Normal esophageal mucosa aside from Schatzki's ring not manipulated (the patient not dysphagic) Large diaphragmatic and likely paraesophageal hernia, gastric      mucosa  appeared normal, otherwise pylorus  patent, normal D1 and D2.   HERNIA REPAIR     HIATAL HERNIA REPAIR  06/2008   paraesophageal hernia repair   TUBAL LIGATION      MEDICATIONS: Current Outpatient Medications on File Prior to Visit  Medication Sig Dispense Refill   ALEVE 220 MG tablet Take 1 tablet (220 mg total) by mouth 2 (two) times daily as needed. Patient prefers brand Aleve 30 tablet 0   amoxicillin-clavulanate (AUGMENTIN) 875-125 MG tablet Take 1 tablet by mouth 2 (two) times daily. 14 tablet 0   benzonatate (TESSALON) 100 MG capsule Take 1-2 capsules (100-200 mg total) by mouth 3 (three) times daily as needed. 30 capsule 0   buPROPion (WELLBUTRIN XL) 150 MG 24 hr tablet Take 1 tablet (150 mg total) by mouth daily. Total of 450 mg daily. Take along with 300 mg tab 30 tablet 5   buPROPion (WELLBUTRIN XL) 300 MG 24 hr tablet Take 1 tablet (300 mg total) by mouth daily. Total of 450 mg daily. Take along with 150 mg tab 30 tablet 5   clonazePAM (KLONOPIN) 1 MG tablet Take 1 tablet (1 mg total) by mouth 2 (two) times daily. 60 tablet 1   fluticasone (FLONASE) 50 MCG/ACT nasal spray Place 2 sprays into both nostrils daily. 16 g 0   linaclotide (LINZESS) 72 MCG capsule Take 72 mcg by mouth daily before breakfast.     Multiple Vitamins-Minerals (CENTRUM SILVER 50+WOMEN PO) Take by mouth daily.     pravastatin (PRAVACHOL) 20 MG tablet Take 20 mg by mouth daily.     promethazine (PHENERGAN) 25 MG tablet Take 1 tablet (25 mg total) by mouth every 6 (six) hours as needed for nausea. 30 tablet 0   rizatriptan (MAXALT) 10 MG tablet Take 10 mg by mouth as needed for migraine. May repeat in 2 hours if needed     venlafaxine XR (EFFEXOR-XR) 75 MG 24 hr capsule Take 1 capsule (75 mg total) by mouth daily with breakfast. 30 capsule 5   No current facility-administered medications on file prior to visit.    ALLERGIES: Allergies  Allergen Reactions   Aspirin Other (See Comments)    Due to hernia   Hydrocodone-Acetaminophen Itching    Lorazepam Other (See Comments)    migranes   Vicodin [Hydrocodone-Acetaminophen]     Pt states it gives her a headache    FAMILY HISTORY: Family History  Problem Relation Age of Onset   Hyperlipidemia Mother    Hypertension Mother    Stroke Mother    Dementia Mother    Migraines Mother    Anxiety disorder Mother    Depression Mother    Hyperlipidemia Son    Hypertension Son    Heart disease Father    Heart disease Sister    Alcohol abuse Sister    Epilepsy Brother    Heart disease Brother    Colon cancer Neg Hx     Objective:  Blood pressure 120/79, pulse 83, height 5' (1.524 m), weight 169 lb (76.7 kg), SpO2 100%. General: No acute distress.  Patient appears well-groomed.   Head:  Normocephalic/atraumatic Eyes:  fundi examined but not visualized Neck: supple, no paraspinal tenderness, full range of motion, tenderness to palpation of left trapezius Heart: regular rate and rhythm Neurological Exam: Mental status: alert and oriented to person, place, and time, speech fluent and not dysarthric, language intact. Cranial nerves: CN I: not tested CN II: pupils equal, round and reactive to light, visual  fields intact CN III, IV, VI:  full range of motion, no nystagmus, no ptosis CN V: facial sensation intact. CN VII: upper and lower face symmetric CN VIII: hearing intact CN IX, X: gag intact, uvula midline CN XI: sternocleidomastoid and trapezius muscles intact CN XII: tongue midline Bulk & Tone: normal, no fasciculations. Motor:  muscle strength 5/5 throughout Sensation:  Pinprick and vibratory sensation intact. Deep Tendon Reflexes:  2+ throughout,  toes downgoing.   Finger to nose testing:  Without dysmetria.    Gait:  Normal station and stride.  Romberg negative.    Thank you for allowing me to take part in the care of this patient.  Shon Millet, DO  CC: Kirstie Peri, MD

## 2023-08-28 ENCOUNTER — Encounter: Payer: Self-pay | Admitting: Neurology

## 2023-08-28 ENCOUNTER — Ambulatory Visit: Payer: Medicaid Other | Admitting: Neurology

## 2023-08-28 VITALS — BP 120/79 | HR 83 | Ht 60.0 in | Wt 169.0 lb

## 2023-08-28 DIAGNOSIS — M542 Cervicalgia: Secondary | ICD-10-CM

## 2023-08-28 DIAGNOSIS — G43009 Migraine without aura, not intractable, without status migrainosus: Secondary | ICD-10-CM

## 2023-08-28 NOTE — Progress Notes (Signed)
 Medication Samples have been provided to the patient.  Drug name: Nurtec       Strength: 75 mg        Qty: 2  LOT: [id number]  Exp.Date: 11/27  Dosing instructions: as needed  The patient has been instructed regarding the correct time, dose, and frequency of taking this medication, including desired effects and most common side effects.   Leida Lauth 3:25 PM 08/28/2023

## 2023-08-28 NOTE — Patient Instructions (Signed)
 At earliest onset of migraine, take Nurtec (one daily as needed).  If ineffective, then next time you have a migraine, take with the Maxalt.  May still repeat maxalt in 2 hours but Nurtec is only once in 24 hours.  Let me know if it works Consider physical therapy for neck alert and oriented.  Speech fluent and not dysarthric, language intact.  CN II-XII intact. Bulk and tone normal, muscle strength 5/5 throughout.  Sensation to light touch intact.  Deep tendon reflexes 2+ throughout.  Finger to nose testing intact.  Gait normal, Romberg negative. Keep headache diary Follow up 6 months.

## 2023-09-04 NOTE — Telephone Encounter (Signed)
 Could you contact her to let her know that we can do a video visit and schedule her accordingly? Thanks.
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
## 2023-09-10 ENCOUNTER — Telehealth: Payer: Medicaid Other | Admitting: Psychiatry

## 2023-09-10 ENCOUNTER — Encounter: Payer: Self-pay | Admitting: Psychiatry

## 2023-09-10 DIAGNOSIS — F4381 Prolonged grief disorder: Secondary | ICD-10-CM

## 2023-09-10 DIAGNOSIS — F419 Anxiety disorder, unspecified: Secondary | ICD-10-CM

## 2023-09-10 DIAGNOSIS — F330 Major depressive disorder, recurrent, mild: Secondary | ICD-10-CM | POA: Diagnosis not present

## 2023-09-10 MED ORDER — CLONAZEPAM 1 MG PO TABS: ORAL_TABLET | ORAL | 0 refills | Status: DC

## 2023-09-10 MED ORDER — BUPROPION HCL ER (XL) 150 MG PO TB24: 150.0000 mg | ORAL_TABLET | Freq: Every day | ORAL | 5 refills | Status: DC

## 2023-09-24 ENCOUNTER — Other Ambulatory Visit: Payer: Self-pay | Admitting: Psychiatry

## 2023-09-24 DIAGNOSIS — F330 Major depressive disorder, recurrent, mild: Secondary | ICD-10-CM

## 2023-10-02 ENCOUNTER — Telehealth: Admitting: Family Medicine

## 2023-10-02 DIAGNOSIS — K219 Gastro-esophageal reflux disease without esophagitis: Secondary | ICD-10-CM | POA: Diagnosis not present

## 2023-10-02 MED ORDER — SUCRALFATE 1 GM/10ML PO SUSP: 1.0000 g | Freq: Three times a day (TID) | ORAL | 0 refills | Status: DC

## 2023-10-02 NOTE — Patient Instructions (Signed)
 GERD in Adults: What to Know  Gastroesophageal reflux (GER) is when acid from your stomach flows up into your esophagus. Your esophagus is the part of your body that moves food from your mouth to your stomach. Normally, food goes down and stays in your stomach to be digested. But with GER, food and stomach acid may go back up. You may have a disease called gastroesophageal reflux disease (GERD) if the reflux: Happens often. Causes very bad symptoms. Makes your esophagus sore and swollen. Over time, GERD can make small holes called ulcers in the lining of your esophagus. What are the causes? GERD is caused by a problem with the muscle between your esophagus and stomach. This muscle is called the lower esophageal sphincter (LES). When it's weak or not normal, it doesn't close like it should. This means food and stomach acid can go back up into your esophagus. The muscle can be weak if: You smoke or use products with tobacco in them. You're pregnant. You have a type of hernia called a hiatal hernia. You eat certain foods and drinks. These include: Alcohol. Coffee. Chocolate. Onions. Peppermint. What increases the risk? Being overweight. Having a disease that affects your connective tissue. Taking NSAIDs, such as ibuprofen. What are the signs or symptoms? Heartburn. Trouble swallowing. Pain when you swallow. The feeling of having a lump in your throat. A bitter taste in your mouth. Bad breath. Having an upset or bloated stomach. Burping. Chest pain. Other conditions can also cause chest pain. Make sure you see your health care provider if you have chest pain. Wheezing. This is when you make high-pitched whistling sounds when you breathe, most often when you breathe out. A long-term cough or a cough at night. How is this diagnosed? GERD may be diagnosed based on your medical history and a physical exam. You may also have tests. These may include: An endoscopy. This test looks at your  stomach and esophagus with a small camera. A barium swallow test. This shows the shape and size of your esophagus and how well it's working. Tests of your esophagus to check for: Acid levels. Pressure. How is this treated? Treatment may depend on how bad your symptoms are. It may include: Changes to your diet and daily life. Medicines. Surgery. Follow these instructions at home: Eating and drinking Follow an eating plan as told by your provider. You may need to avoid certain foods and drinks. These may include: Coffee and tea, with or without caffeine. Alcohol. Energy drinks and sports drinks. Fizzy drinks or sodas. Chocolate and cocoa. Peppermint and mint flavorings. Garlic and onions. Horseradish. Spicy and acidic foods. These include: Peppers. Chili powder and curry powder. Vinegar. Hot sauces and BBQ sauce. Citrus fruits and juices. These include: Oranges. Lemons. Limes. Tomato-based foods. These include: Red sauce and pizza with red sauce. Chili. Salsa. Fried and fatty foods. These include: Donuts. [place] fries. Potato chips. High-fat dressings. High-fat meats. These include: Hot dogs and sausage. Rib eye steak. Ham and bacon. High-fat dairy items. These include: Whole milk. Butter. Cream cheese. Eat small meals often. Avoid eating big meals. Avoid drinking lots of liquid with your meals. Try not to eat meals during the 2-3 hours before bedtime. Try not to lie down right after you eat. Do not exercise right after you eat. Lifestyle  If you're overweight, lose an amount of weight that's healthy for you. Ask your provider about a safe weight loss goal. Do not smoke, vape, or use nicotine or tobacco. Wear  loose clothes. Do not wear things that are tight around your waist. When you sleep, try: Raising the head of your bed about 6 inches (15 cm). You can use a wedge to do this. Lying down on your left side. Try to lower your stress. If you need help doing  this, ask your provider. General instructions Take your medicines only as told. Do not take aspirin or ibuprofen unless you're told to. Watch for any changes in your symptoms. Do not bend over if it makes your symptoms worse. Contact a health care provider if: You have new symptoms. You have trouble: Drinking. Swallowing. Eating. It hurts to swallow. You have wheezing. You have a cough that won't go away. Your voice is hoarse. Your symptoms don't get better with treatment. Get help right away if: You have pain all of a sudden in your: Arm. Neck. Jaw. Teeth. Back. You feel sweaty, dizzy, or light-headed all of a sudden. You faint. You have chest pain or shortness of breath. You vomit and the vomit is: Green, yellow, or black. Looks like blood or coffee grounds. Your poop is red, bloody, or black. These symptoms may be an emergency. Call 911 right away. Do not wait to see if the symptoms will go away. Do not drive yourself to the hospital. This information is not intended to replace advice given to you by your health care provider. Make sure you discuss any questions you have with your health care provider. Document Revised: 03/31/2023 Document Reviewed: 10/15/2022 Elsevier Patient Education  2024 [employer].

## 2023-10-02 NOTE — Progress Notes (Signed)
 Virtual Visit Consent   Rebecca Barrera, you are scheduled for a virtual visit with a [facility] provider today. Just as with appointments in the office, your consent must be obtained to participate. Your consent will be active for this visit and any virtual visit you may have with one of our providers in the next 365 days. If you have a MyChart account, a copy of this consent can be sent to you electronically.  As this is a virtual visit, video technology does not allow for your provider to perform a traditional examination. This may limit your provider's ability to fully assess your condition. If your provider identifies any concerns that need to be evaluated in person or the need to arrange testing (such as labs, EKG, etc.), we will make arrangements to do so. Although advances in technology are sophisticated, we cannot ensure that it will always work on either your end or our end. If the connection with a video visit is poor, the visit may have to be switched to a telephone visit. With either a video or telephone visit, we are not always able to ensure that we have a secure connection.  By engaging in this virtual visit, you consent to the provision of healthcare and authorize for your insurance to be billed (if applicable) for the services provided during this visit. Depending on your insurance coverage, you may receive a charge related to this service.  I need to obtain your verbal consent now. Are you willing to proceed with your visit today? Rebecca Barrera has provided verbal consent on 10/02/2023 for a virtual visit (video or telephone). Albertha Huger, FNP  Date: 10/02/2023 2:19 PM   Virtual Visit via Video Note   I, Albertha Huger, connected with  Rebecca Barrera  ([id number], February 06, 1961) on 10/02/23 at  2:15 PM EDT by a video-enabled telemedicine application and verified that I am speaking with the correct person using two identifiers.  Location: Patient: Virtual Visit Location Patient: Home Provider:  Virtual Visit Location Provider: Home Office   I discussed the limitations of evaluation and management by telemedicine and the availability of in person appointments. The patient expressed understanding and agreed to proceed.    History of Present Illness: Rebecca Barrera is a 63 y.o. who identifies as a female who was assigned female at birth, and is being seen today for gerd flare. She was on meds in the past and stopped meds. She ate something that caused a flare. Her pcp sent protonix . She wants carafate  which she has had before. 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 Patient prefers brand Aleve , Disp: 30 tablet, Rfl: 0   [START ON 10/06/2023] buPROPion  (WELLBUTRIN  XL) 150 MG 24 hr tablet, Take 1 tablet (150 mg total) by mouth daily. Total of 450 mg daily. Take along with 300 mg tab, Disp: 30 tablet, Rfl: 5   buPROPion  (WELLBUTRIN  XL) 300 MG 24 hr tablet, Take 1 tablet (300 mg total) by mouth daily. Total of 450 mg daily. Take along with 150 mg tab, Disp: 30 tablet, Rfl: 5   [START ON 10/06/2023]  clonazePAM  (KLONOPIN ) 1 MG tablet, May take 1 tablet (1 mg total) by mouth daily as needed for anxiety. May also take 0.5 tablets (0.5 mg total) at bedtime as needed for anxiety., Disp: 45 tablet, Rfl: 0   ergocalciferol (VITAMIN D2) 1.25 MG (50000 UT) capsule, Take 50,000 Units by mouth once a week., Disp: , Rfl:    fluticasone  (FLONASE ) 50 MCG/ACT nasal spray, Place 2 sprays into both nostrils daily., Disp: 16 g, Rfl: 0   linaclotide (LINZESS) 72 MCG capsule, Take 72 mcg by mouth daily before breakfast., Disp: , Rfl:    Multiple Vitamins-Minerals (CENTRUM SILVER 50+WOMEN PO), Take by mouth daily., Disp: , Rfl:    pravastatin (PRAVACHOL) 20 MG tablet, Take 20 mg by mouth daily., Disp: , Rfl:    promethazine  (PHENERGAN ) 25 MG tablet, Take 1 tablet (25 mg total) by mouth every 6 (six) hours as needed for nausea., Disp: 30 tablet, Rfl: 0   rizatriptan (MAXALT) 10 MG tablet, Take 10 mg by mouth as needed for migraine. May repeat in 2 hours if needed, Disp: , Rfl:    venlafaxine  XR (EFFEXOR -XR) 75 MG 24 hr capsule, Take 1 capsule (75 mg total) by mouth daily with breakfast., Disp: 30 capsule, Rfl: 5  Observations/Objective: Patient is well-developed, well-nourished in no acute distress.  Resting comfortably  at home.  Head is normocephalic, atraumatic.  No labored breathing.  Speech is clear and coherent with logical content.  Patient is alert and oriented at baseline.    Assessment and Plan: 1. Gastroesophageal reflux disease, unspecified whether esophagitis present (Primary)  Increase fluids, nothing acidic, start protonix  and carafate ., ED if sx worsen.   Follow Up Instructions: I discussed the assessment and treatment plan with the patient. The patient was provided an opportunity to ask questions and all were answered. The patient agreed with the plan and demonstrated an understanding of the instructions.  A copy of instructions were sent to the patient via MyChart unless otherwise noted  below.     The patient was advised to call back or seek an in-person evaluation if the symptoms worsen or if the condition fails to improve as anticipated.    Ardean Melroy, FNP

## 2023-10-03 ENCOUNTER — Encounter (HOSPITAL_COMMUNITY): Payer: Self-pay | Admitting: Emergency Medicine

## 2023-10-03 ENCOUNTER — Other Ambulatory Visit: Payer: Self-pay

## 2023-10-03 ENCOUNTER — Emergency Department (HOSPITAL_COMMUNITY)
Admission: EM | Admit: 2023-10-03 | Discharge: 2023-10-03 | Disposition: A | Discharge status: Home / Self Care | Attending: Emergency Medicine | Admitting: Emergency Medicine

## 2023-10-03 ENCOUNTER — Telehealth: Admitting: Nurse Practitioner

## 2023-10-03 DIAGNOSIS — R110 Nausea: Secondary | ICD-10-CM | POA: Diagnosis not present

## 2023-10-03 DIAGNOSIS — K219 Gastro-esophageal reflux disease without esophagitis: Secondary | ICD-10-CM | POA: Diagnosis not present

## 2023-10-03 DIAGNOSIS — R1013 Epigastric pain: Secondary | ICD-10-CM | POA: Diagnosis present

## 2023-10-03 DIAGNOSIS — F419 Anxiety disorder, unspecified: Secondary | ICD-10-CM | POA: Diagnosis not present

## 2023-10-03 DIAGNOSIS — R112 Nausea with vomiting, unspecified: Secondary | ICD-10-CM | POA: Insufficient documentation

## 2023-10-03 DIAGNOSIS — R072 Precordial pain: Secondary | ICD-10-CM | POA: Diagnosis not present

## 2023-10-03 DIAGNOSIS — I10 Essential (primary) hypertension: Secondary | ICD-10-CM | POA: Insufficient documentation

## 2023-10-03 LAB — COMPREHENSIVE METABOLIC PANEL WITH GFR
ALT: 24 U/L (ref 0–44)
AST: 26 U/L (ref 15–41)
Albumin: 4.4 g/dL (ref 3.5–5.0)
Alkaline Phosphatase: 62 U/L (ref 38–126)
Anion gap: 12 (ref 5–15)
BUN: 19 mg/dL (ref 6–20)
CO2: 27 mmol/L (ref 22–32)
Calcium: 9.7 mg/dL (ref 8.9–10.3)
Chloride: 103 mmol/L (ref 98–111)
Creatinine, Ser: 0.79 mg/dL (ref 0.44–1.00)
GFR, Estimated: >60 mL/min (ref >60)
Glucose, Bld: 122 mg/dL — ABNORMAL HIGH (ref 70–99)
Potassium: 3.1 mmol/L — ABNORMAL LOW (ref 3.5–5.1)
Sodium: 142 mmol/L (ref 135–145)
Total Bilirubin: 0.9 mg/dL (ref 0.0–1.2)
Total Protein: 7.7 g/dL (ref 6.5–8.1)

## 2023-10-03 LAB — CBC WITH DIFFERENTIAL/PLATELET
Abs Immature Granulocytes: 0.02 10*3/uL (ref 0.00–0.07)
Basophils Absolute: 0 10*3/uL (ref 0.0–0.1)
Basophils Relative: 0 %
Eosinophils Absolute: 0 10*3/uL (ref 0.0–0.5)
Eosinophils Relative: 0 %
HCT: 47.5 % — ABNORMAL HIGH (ref 36.0–46.0)
Hemoglobin: 15.5 g/dL — ABNORMAL HIGH (ref 12.0–15.0)
Immature Granulocytes: 0 %
Lymphocytes Relative: 20 %
Lymphs Abs: 1.5 10*3/uL (ref 0.7–4.0)
MCH: 31 pg (ref 26.0–34.0)
MCHC: 32.6 g/dL (ref 30.0–36.0)
MCV: 95 fL (ref 80.0–100.0)
Monocytes Absolute: 0.3 10*3/uL (ref 0.1–1.0)
Monocytes Relative: 5 %
Neutro Abs: 5.3 10*3/uL (ref 1.7–7.7)
Neutrophils Relative %: 75 %
Platelets: 208 10*3/uL (ref 150–400)
RBC: 5 MIL/uL (ref 3.87–5.11)
RDW: 12.4 % (ref 11.5–15.5)
WBC: 7.2 10*3/uL (ref 4.0–10.5)
nRBC: 0 % (ref 0.0–0.2)

## 2023-10-03 LAB — LIPASE, BLOOD: Lipase: 33 U/L (ref 11–51)

## 2023-10-03 MED ORDER — SUCRALFATE 1 GM/10ML PO SUSP
1.0000 g | Freq: Once | ORAL | Status: AC
  Administered 2023-10-03: 1 g via ORAL
  Filled 2023-10-03: qty 10

## 2023-10-03 MED ORDER — VONOPRAZAN FUMARATE 10 MG PO TABS: 10.0000 mg | ORAL_TABLET | Freq: Every day | ORAL | 0 refills | Status: DC

## 2023-10-03 MED ORDER — CLONAZEPAM 0.5 MG PO TABS
1.0000 mg | ORAL_TABLET | Freq: Once | ORAL | Status: AC
  Administered 2023-10-03: 1 mg via ORAL
  Filled 2023-10-03: qty 2

## 2023-10-03 MED ORDER — FAMOTIDINE 40 MG PO TABS: 40.0000 mg | ORAL_TABLET | Freq: Every day | ORAL | 0 refills | Status: DC

## 2023-10-03 MED ORDER — ONDANSETRON HCL 4 MG/2ML IJ SOLN
4.0000 mg | Freq: Once | INTRAMUSCULAR | Status: AC
  Administered 2023-10-03: 4 mg via INTRAVENOUS
  Filled 2023-10-03: qty 2

## 2023-10-03 MED ORDER — ONDANSETRON 4 MG PO TBDP
4.0000 mg | ORAL_TABLET | Freq: Three times a day (TID) | ORAL | 0 refills | Status: DC | PRN
Start: 2023-10-03 — End: 2023-10-13

## 2023-10-03 MED ORDER — SODIUM CHLORIDE 0.9 % IV BOLUS
1000.0000 mL | Freq: Once | INTRAVENOUS | Status: AC
  Administered 2023-10-03: 1000 mL via INTRAVENOUS

## 2023-10-03 MED ORDER — FAMOTIDINE IN NACL 20-0.9 MG/50ML-% IV SOLN
20.0000 mg | Freq: Once | INTRAVENOUS | Status: AC
  Administered 2023-10-03: 20 mg via INTRAVENOUS
  Filled 2023-10-03: qty 50

## 2023-10-03 NOTE — ED Triage Notes (Signed)
 Pt BIB [facility] for acid reflux since yesterday with hx of same, pt began Carafate  yesterday with no relief, vs stable en route, EDP at bedside

## 2023-10-03 NOTE — Discharge Instructions (Signed)
 You have been prescribed new medication for assistance with your heartburn relief.  If this is unavailable at your pharmacy, please obtain and use Pepcid, 20 mg, taken twice daily until you have seen your primary care physician or gastroenterologist.  Return here for concerning changes in your condition.

## 2023-10-03 NOTE — ED Provider Notes (Signed)
 [place] EMERGENCY DEPARTMENT AT [facility] Provider Note   CSN: [id number] Arrival date & time: 10/03/23  1316     History  Chief Complaint  Patient presents with   Heartburn    Rebecca Barrera is a 63 y.o. female.  HPI Patient presents with sternal burning sensation.  She notes a history of gastroesophageal reflux, waxing and waning severity over the years.  This is worsened over the past few days, and she attributes it to eating several hamburgers at once. She has been seen, evaluated by her physician, started medication, but she notes that she has had nausea, vomiting, prohibiting taking all of the medications.  No other chest pain, no dyspnea, no syncope.  Initially she is alone, she is subsequently joined by family members.  She presents via EMS and history is obtained with those individuals as well.    Home Medications Prior to Admission medications   Medication Sig Start Date End Date Taking? Authorizing Provider  Vonoprazan Fumarate 10 MG TABS Take 10 mg by mouth daily. 10/03/23  Yes Dorenda Gandy, MD  alendronate (FOSAMAX) 70 MG tablet Take 70 mg by mouth once a week. 08/13/23   [provider]  ALEVE  220 MG tablet Take 1 tablet (220 mg total) by mouth 2 (two) times daily as needed. Patient prefers brand Aleve  08/11/23   Angelia Kelp, [name]  buPROPion  (WELLBUTRIN  XL) 150 MG 24 hr tablet Take 1 tablet (150 mg total) by mouth daily. Total of 450 mg daily. Take along with 300 mg tab 10/06/23 04/03/24  Todd Fossa, MD  buPROPion  (WELLBUTRIN  XL) 300 MG 24 hr tablet Take 1 tablet (300 mg total) by mouth daily. Total of 450 mg daily. Take along with 150 mg tab 06/10/23 12/07/23  Todd Fossa, MD  clonazePAM  (KLONOPIN ) 1 MG tablet May take 1 tablet (1 mg total) by mouth daily as needed for anxiety. May also take 0.5 tablets (0.5 mg total) at bedtime as needed for anxiety. 10/06/23 11/05/23  Hisada, Reina, MD  ergocalciferol (VITAMIN D2) 1.25 MG (50000 UT) capsule Take  50,000 Units by mouth once a week.    [provider]  famotidine (PEPCID) 40 MG tablet Take 1 tablet (40 mg total) by mouth daily. 10/03/23   Fleming, Zelda W, NP  fluticasone  (FLONASE ) 50 MCG/ACT nasal spray Place 2 sprays into both nostrils daily. 08/11/23   Angelia Kelp, [name]  linaclotide (LINZESS) 72 MCG capsule Take 72 mcg by mouth daily before breakfast.    [provider]  Multiple Vitamins-Minerals (CENTRUM SILVER 50+WOMEN PO) Take by mouth daily.    [provider]  ondansetron  (ZOFRAN -ODT) 4 MG disintegrating tablet Take 1 tablet (4 mg total) by mouth every 8 (eight) hours as needed for nausea or vomiting. 10/03/23   Fleming, Zelda W, NP  pravastatin (PRAVACHOL) 20 MG tablet Take 20 mg by mouth daily.    [provider]  promethazine  (PHENERGAN ) 25 MG tablet Take 1 tablet (25 mg total) by mouth every 6 (six) hours as needed for nausea. 08/15/12   Dorenda Gandy, MD  rizatriptan (MAXALT) 10 MG tablet Take 10 mg by mouth as needed for migraine. May repeat in 2 hours if needed    [provider]  sucralfate  (CARAFATE ) 1 GM/10ML suspension Take 10 mLs (1 g total) by mouth 4 (four) times daily -  with meals and at bedtime for 10 days. 10/02/23 10/12/23  Blair, Diane W, FNP  venlafaxine  XR (EFFEXOR -XR) 75 MG 24 hr  capsule Take 1 capsule (75 mg total) by mouth daily with breakfast. 06/14/23 12/11/23  Todd Fossa, MD      Allergies    Aspirin, Hydrocodone -acetaminophen , Lorazepam, and Vicodin [hydrocodone -acetaminophen ]    Review of Systems   Review of Systems  Physical Exam Updated Vital Signs BP 135/79   Pulse 70   Temp 98 F (36.7 C) (Oral)   Resp 17   Ht 5' (1.524 m)   Wt 76.7 kg   SpO2 97%   BMI 33.01 kg/m  Physical Exam Vitals and nursing note reviewed.  Constitutional:      General: She is not in acute distress.    Appearance: She is well-developed.  HENT:     Head: Normocephalic and atraumatic.  Eyes:      Conjunctiva/sclera: Conjunctivae normal.  Cardiovascular:     Rate and Rhythm: Normal rate and regular rhythm.  Pulmonary:     Effort: Pulmonary effort is normal. No respiratory distress.     Breath sounds: Normal breath sounds. No stridor.  Abdominal:     General: There is no distension.     Tenderness: There is no guarding.    Skin:    General: Skin is warm and dry.  Neurological:     Mental Status: She is alert and oriented to person, place, and time.     Cranial Nerves: No cranial nerve deficit.  Psychiatric:        Mood and Affect: Mood is anxious.     ED Results / Procedures / Treatments   Labs (all labs ordered are listed, but only abnormal results are displayed) Labs Reviewed  COMPREHENSIVE METABOLIC PANEL WITH GFR - Abnormal; Notable for the following components:      Result Value   Potassium 3.1 (*)    Glucose, Bld 122 (*)    All other components within normal limits  CBC WITH DIFFERENTIAL/PLATELET - Abnormal; Notable for the following components:   Hemoglobin 15.5 (*)    HCT 47.5 (*)    All other components within normal limits  LIPASE, BLOOD    EKG None  Radiology No results found.  Procedures Procedures    Medications Ordered in ED Medications  sodium chloride  0.9 % bolus 1,000 mL (0 mLs Intravenous Stopped 10/03/23 1453)  famotidine (PEPCID) IVPB 20 mg premix (0 mg Intravenous Stopped 10/03/23 1454)  sucralfate  (CARAFATE ) 1 GM/10ML suspension 1 g (1 g Oral Given 10/03/23 1355)  ondansetron  (ZOFRAN ) injection 4 mg (4 mg Intravenous Given 10/03/23 1458)  clonazePAM  (KLONOPIN ) tablet 1 mg (1 mg Oral Given 10/03/23 1509)    ED Course/ Medical Decision Making/ A&P                                 Medical Decision Making Patient with a history of GERD, hypertension presents with sternal discomfort, epigastric pain, nausea, vomiting, concern for gastroesophageal process, though atypical ACS, other intrathoracic etiology considered. Patient had labs Carafate   Pepcid IV, fluids, improved markedly, was hemodynamically stable throughout, had reassuring lab studies. Troponin normal, rhythm strip nonischemic, and low suspicion for atypical ACS given improvement with GI focused treatment.  Patient received initial medication, will follow-up with primary care and GI this week.   Amount and/or Complexity of Data Reviewed Independent Historian: EMS External Data Reviewed: notes. Labs: ordered. Decision-making details documented in ED Course.  Risk Prescription drug management. Decision regarding hospitalization.   Final Clinical Impression(s) / ED Diagnoses Final diagnoses:  Epigastric pain    Rx / DC Orders ED Discharge Orders          Ordered    Vonoprazan Fumarate 10 MG TABS  Daily        10/03/23 1538              Dorenda Gandy, MD 10/03/23 1538

## 2023-10-03 NOTE — Patient Instructions (Signed)
 Rebecca Barrera, thank you for joining Collins Dean, NP for today's virtual visit.  While this provider is not your primary care provider (PCP), if your PCP is located in our provider database this encounter information will be shared with them immediately following your visit.   A [facility] MyChart account gives you access to today's visit and all your visits, tests, and labs performed at [facility] " click here if you don't have a [facility] MyChart account or go to [email or web address]  Consent: (Patient) Rebecca Barrera provided verbal consent for this virtual visit at the beginning of the encounter.  Current Medications:  Current Outpatient Medications:    famotidine (PEPCID) 40 MG tablet, Take 1 tablet (40 mg total) by mouth daily., Disp: 30 tablet, Rfl: 0   ondansetron  (ZOFRAN -ODT) 4 MG disintegrating tablet, Take 1 tablet (4 mg total) by mouth every 8 (eight) hours as needed for nausea or vomiting., Disp: 30 tablet, Rfl: 0   alendronate (FOSAMAX) 70 MG tablet, Take 70 mg by mouth once a week., Disp: , Rfl:    ALEVE  220 MG tablet, Take 1 tablet (220 mg total) by mouth 2 (two) times daily as needed. Patient prefers brand Aleve , Disp: 30 tablet, Rfl: 0   [START ON 10/06/2023] buPROPion  (WELLBUTRIN  XL) 150 MG 24 hr tablet, Take 1 tablet (150 mg total) by mouth daily. Total of 450 mg daily. Take along with 300 mg tab, Disp: 30 tablet, Rfl: 5   buPROPion  (WELLBUTRIN  XL) 300 MG 24 hr tablet, Take 1 tablet (300 mg total) by mouth daily. Total of 450 mg daily. Take along with 150 mg tab, Disp: 30 tablet, Rfl: 5   [START ON 10/06/2023] clonazePAM  (KLONOPIN ) 1 MG tablet, May take 1 tablet (1 mg total) by mouth daily as needed for anxiety. May also take 0.5 tablets (0.5 mg total) at bedtime as needed for anxiety., Disp: 45 tablet, Rfl: 0   ergocalciferol (VITAMIN D2) 1.25 MG (50000 UT) capsule, Take 50,000 Units by mouth once a week., Disp: , Rfl:    fluticasone  (FLONASE ) 50 MCG/ACT  nasal spray, Place 2 sprays into both nostrils daily., Disp: 16 g, Rfl: 0   linaclotide (LINZESS) 72 MCG capsule, Take 72 mcg by mouth daily before breakfast., Disp: , Rfl:    Multiple Vitamins-Minerals (CENTRUM SILVER 50+WOMEN PO), Take by mouth daily., Disp: , Rfl:    pravastatin (PRAVACHOL) 20 MG tablet, Take 20 mg by mouth daily., Disp: , Rfl:    promethazine  (PHENERGAN ) 25 MG tablet, Take 1 tablet (25 mg total) by mouth every 6 (six) hours as needed for nausea., Disp: 30 tablet, Rfl: 0   rizatriptan (MAXALT) 10 MG tablet, Take 10 mg by mouth as needed for migraine. May repeat in 2 hours if needed, Disp: , Rfl:    sucralfate  (CARAFATE ) 1 GM/10ML suspension, Take 10 mLs (1 g total) by mouth 4 (four) times daily -  with meals and at bedtime for 10 days., Disp: 400 mL, Rfl: 0   venlafaxine  XR (EFFEXOR -XR) 75 MG 24 hr capsule, Take 1 capsule (75 mg total) by mouth daily with breakfast., Disp: 30 capsule, Rfl: 5   Medications ordered in this encounter:  Meds ordered this encounter  Medications   famotidine (PEPCID) 40 MG tablet    Sig: Take 1 tablet (40 mg total) by mouth daily.    Dispense:  30 tablet    Refill:  0    Supervising Provider:   Corine Dice [phone]  ondansetron  (ZOFRAN -ODT) 4 MG disintegrating tablet    Sig: Take 1 tablet (4 mg total) by mouth every 8 (eight) hours as needed for nausea or vomiting.    Dispense:  30 tablet    Refill:  0    Supervising Provider:   LAMPTEY, PHILIP O [[id number]]     *If you need refills on other medications prior to your next appointment, please contact your pharmacy*  Follow-Up: Call back or seek an in-person evaluation if the symptoms worsen or if the condition fails to improve as anticipated.  [facility] [phone]  Other Instructions INSTRUCTIONS: Avoid GERD Triggers: acidic, spicy or fried foods, caffeine , coffee, sodas,  alcohol and chocolate.   Discussed brat diet   If you have been instructed to have an  in-person evaluation today at a local Urgent Care facility, please use the link below. It will take you to a list of all of our available [facility], including address, phone number and hours of operation. Please do not delay care.  [facility]  If you or a family member do not have a primary care provider, use the link below to schedule a visit and establish care. When you choose a  primary care physician or advanced practice provider, you gain a long-term partner in health. Find a Primary Care Provider  Learn more about 's in-office and virtual care options:  - Get Care Now

## 2023-10-03 NOTE — Progress Notes (Signed)
 Virtual Visit Consent   Rebecca Barrera, you are scheduled for a virtual visit with a [facility] provider today. Just as with appointments in the office, your consent must be obtained to participate. Your consent will be active for this visit and any virtual visit you may have with one of our providers in the next 365 days. If you have a MyChart account, a copy of this consent can be sent to you electronically.  As this is a virtual visit, video technology does not allow for your provider to perform a traditional examination. This may limit your provider's ability to fully assess your condition. If your provider identifies any concerns that need to be evaluated in person or the need to arrange testing (such as labs, EKG, etc.), we will make arrangements to do so. Although advances in technology are sophisticated, we cannot ensure that it will always work on either your end or our end. If the connection with a video visit is poor, the visit may have to be switched to a telephone visit. With either a video or telephone visit, we are not always able to ensure that we have a secure connection.  By engaging in this virtual visit, you consent to the provision of healthcare and authorize for your insurance to be billed (if applicable) for the services provided during this visit. Depending on your insurance coverage, you may receive a charge related to this service.  I need to obtain your verbal consent now. Are you willing to proceed with your visit today? [name] has provided verbal consent on 10/03/2023 for a virtual visit (video or telephone). Collins Dean, NP  Date: 10/03/2023 12:35 PM   Virtual Visit via Video Note   I, Collins Dean, connected with  [name]  ([id number], 14-Aug-1960) on 10/03/23 at  1:00 PM EDT by a video-enabled telemedicine application and verified that I am speaking with the correct person using two identifiers.  Location: Patient: Virtual Visit Location Patient:  Home Provider: Virtual Visit Location Provider: Home Office   I discussed the limitations of evaluation and management by telemedicine and the availability of in person appointments. The patient expressed understanding and agreed to proceed.    History of Present Illness: Rebecca Barrera is a 63 y.o. who identifies as a female who was assigned female at birth, and is being seen today for GERD and Nausea.   [name] states that her acid reflux is flaring up.  She was prescribed super fate yesterday by a virtual provider however she states this is "making me sick".  She is requesting a medication that "works faster than the PPIs".  She endorses current symptoms of nausea, heartburn and indigestion, sour taste in mouth and sore throat.    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   S/P repair of paraesophageal hernia 01/29/2011    Allergies:  Allergies  Allergen Reactions   Aspirin Other (See Comments)    Due to hernia   Hydrocodone -Acetaminophen  Itching   Lorazepam Other (See Comments)    migranes   Vicodin [Hydrocodone -Acetaminophen ]     Pt states it gives her a headache   Medications:  Current Outpatient Medications:    famotidine (PEPCID) 40 MG tablet, Take 1 tablet (40 mg total) by mouth daily., Disp: 30 tablet, Rfl: 0   ondansetron  (ZOFRAN -ODT) 4 MG disintegrating tablet, Take 1 tablet (4 mg total) by mouth every 8 (eight) hours as needed for nausea or vomiting., Disp: 30 tablet, Rfl: 0   alendronate (FOSAMAX) 70 MG tablet, Take 70 mg by mouth once a week., Disp: , Rfl:    ALEVE  220 MG tablet, Take 1 tablet (220 mg total) by  mouth 2 (two) times daily as needed. Patient prefers brand Aleve , Disp: 30 tablet, Rfl: 0   [START ON 10/06/2023] buPROPion  (WELLBUTRIN  XL) 150 MG 24 hr tablet, Take 1 tablet (150 mg total) by mouth daily. Total of 450 mg daily. Take along with 300 mg tab, Disp: 30 tablet, Rfl: 5   buPROPion  (WELLBUTRIN  XL) 300 MG 24 hr tablet, Take 1 tablet (300 mg total) by mouth daily. Total of 450 mg daily. Take along with 150 mg tab, Disp: 30 tablet, Rfl: 5   [START ON 10/06/2023] clonazePAM  (KLONOPIN ) 1 MG tablet, May take 1 tablet (1 mg total) by mouth daily as needed for anxiety. May also take 0.5 tablets (0.5 mg total) at bedtime as needed for anxiety., Disp: 45 tablet, Rfl: 0   ergocalciferol (VITAMIN D2) 1.25 MG (50000 UT) capsule, Take 50,000 Units by mouth once a week., Disp: , Rfl:    fluticasone  (FLONASE ) 50 MCG/ACT nasal spray, Place 2 sprays into both nostrils daily., Disp: 16 g, Rfl: 0   linaclotide (LINZESS) 72 MCG capsule, Take 72 mcg by mouth daily before breakfast., Disp: , Rfl:    Multiple Vitamins-Minerals (CENTRUM SILVER 50+WOMEN PO), Take by mouth daily., Disp: , Rfl:    pravastatin (PRAVACHOL) 20 MG tablet, Take 20 mg by mouth daily., Disp: , Rfl:    promethazine  (PHENERGAN ) 25 MG tablet, Take 1 tablet (25 mg total) by mouth every 6 (six) hours as needed for nausea., Disp: 30 tablet, Rfl: 0   rizatriptan (MAXALT) 10 MG tablet, Take 10 mg by mouth as needed for migraine. May repeat in 2 hours if needed, Disp: , Rfl:    sucralfate  (CARAFATE ) 1 GM/10ML suspension, Take 10 mLs (1 g total) by mouth 4 (four) times daily -  with meals and at bedtime for 10 days., Disp: 400 mL, Rfl: 0   venlafaxine  XR (EFFEXOR -XR) 75 MG 24 hr capsule, Take 1 capsule (75 mg total) by mouth daily with breakfast., Disp: 30 capsule, Rfl: 5  Observations/Objective: Patient is well-developed, well-nourished in no acute distress.  Resting comfortably at home.  Head is normocephalic, atraumatic.  No labored breathing.   Speech is clear and coherent with logical content.  Patient is alert and oriented at baseline.    Assessment and Plan: 1. GERD without esophagitis (Primary) - famotidine (PEPCID) 40 MG tablet; Take 1 tablet (40 mg total) by mouth daily.  Dispense: 30 tablet; Refill: 0  2. Nausea - ondansetron  (ZOFRAN -ODT) 4 MG disintegrating tablet; Take 1 tablet (4 mg total) by mouth every 8 (eight) hours as needed for nausea or vomiting.  Dispense: 30 tablet; Refill: 0  INSTRUCTIONS: Avoid GERD Triggers: acidic, spicy or fried foods, caffeine , coffee, sodas,  alcohol and chocolate.    Discussed brat diet   Follow Up Instructions: I discussed the assessment and treatment plan with the patient. The patient was provided an opportunity to ask questions and all were answered. The patient agreed with the plan and demonstrated an understanding of  the instructions.  A copy of instructions were sent to the patient via MyChart unless otherwise noted below.    The patient was advised to call back or seek an in-person evaluation if the symptoms worsen or if the condition fails to improve as anticipated.    Aimy Sweeting W Tiondra Fang, NP

## 2023-10-05 ENCOUNTER — Telehealth: Admitting: Family Medicine

## 2023-10-05 ENCOUNTER — Telehealth: Admitting: Physician Assistant

## 2023-10-05 DIAGNOSIS — K449 Diaphragmatic hernia without obstruction or gangrene: Secondary | ICD-10-CM

## 2023-10-05 DIAGNOSIS — M62838 Other muscle spasm: Secondary | ICD-10-CM | POA: Diagnosis not present

## 2023-10-05 DIAGNOSIS — K2100 Gastro-esophageal reflux disease with esophagitis, without bleeding: Secondary | ICD-10-CM | POA: Diagnosis not present

## 2023-10-05 DIAGNOSIS — Z91199 Patient's noncompliance with other medical treatment and regimen due to unspecified reason: Secondary | ICD-10-CM

## 2023-10-05 MED ORDER — BACLOFEN 10 MG PO TABS: 10.0000 mg | ORAL_TABLET | Freq: Three times a day (TID) | ORAL | 0 refills | Status: DC

## 2023-10-05 NOTE — Progress Notes (Signed)
 The patient no-showed for appointment despite this provider sending direct link, reaching out via phone with no response and waiting for at least 10 minutes from appointment time for patient to join. They will be marked as a NS for this appointment/time.   Freddy Finner, NP

## 2023-10-05 NOTE — Progress Notes (Signed)
 Virtual Visit Consent   Rebecca Barrera, you are scheduled for a virtual visit with a [facility] provider today. Just as with appointments in the office, your consent must be obtained to participate. Your consent will be active for this visit and any virtual visit you may have with one of our providers in the next 365 days. If you have a MyChart account, a copy of this consent can be sent to you electronically.  As this is a virtual visit, video technology does not allow for your provider to perform a traditional examination. This may limit your provider's ability to fully assess your condition. If your provider identifies any concerns that need to be evaluated in person or the need to arrange testing (such as labs, EKG, etc.), we will make arrangements to do so. Although advances in technology are sophisticated, we cannot ensure that it will always work on either your end or our end. If the connection with a video visit is poor, the visit may have to be switched to a telephone visit. With either a video or telephone visit, we are not always able to ensure that we have a secure connection.  By engaging in this virtual visit, you consent to the provision of healthcare and authorize for your insurance to be billed (if applicable) for the services provided during this visit. Depending on your insurance coverage, you may receive a charge related to this service.  I need to obtain your verbal consent now. Are you willing to proceed with your visit today? Rebecca Barrera has provided verbal consent on 10/05/2023 for a virtual visit (video or telephone). Angelia Kelp, [name]  Date: 10/05/2023 3:17 PM   Virtual Visit via Video Note   I, Angelia Kelp, connected with  Rebecca Barrera  ([id number], 09-14-1960) on 10/05/23 at  2:30 PM EDT by a video-enabled telemedicine application and verified that I am speaking with the correct person using two identifiers.  Location: Patient: Virtual Visit Location Patient:  Home Provider: Virtual Visit Location Provider: Home Office   I discussed the limitations of evaluation and management by telemedicine and the availability of in person appointments. The patient expressed understanding and agreed to proceed.    History of Present Illness: Rebecca Barrera is a 63 y.o. who identifies as a female who was assigned female at birth, and is being seen today for neck spasms. She has been having recurrent, hard to control GERD. It is felt her GERD is secondary to a worsening hiatal hernia. She is awaiting a referral to GI. She is having neck tension in her neck and shoulders area from this. She is requesting a muscle relaxer to help. She is already on Vonoprazan 10mg , Sucralfate  QID, Famotidine 10mg , and Zofran .    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   S/P repair of paraesophageal hernia 01/29/2011    Allergies:  Allergies  Allergen Reactions   Aspirin Other (See Comments)    Due to hernia   Hydrocodone -Acetaminophen  Itching   Lorazepam Other (See Comments)    migranes   Vicodin [Hydrocodone -Acetaminophen ]     Pt states it gives her a headache   Medications:  Current Outpatient Medications:    baclofen (LIORESAL) 10 MG tablet, Take 1 tablet (10 mg total) by mouth 3 (three) times daily., Disp: 30 each, Rfl: 0   [START ON 10/06/2023] buPROPion  (WELLBUTRIN  XL) 150 MG 24 hr tablet, Take 1 tablet (150 mg total) by mouth daily. Total of 450 mg daily. Take along with 300 mg tab, Disp: 30 tablet, Rfl: 5   buPROPion  (WELLBUTRIN  XL) 300 MG 24 hr tablet, Take 1 tablet (300 mg total) by mouth daily. Total of 450 mg  daily. Take along with 150 mg tab, Disp: 30 tablet, Rfl: 5   [START ON 10/06/2023] clonazePAM  (KLONOPIN ) 1 MG tablet, May take 1 tablet (1 mg total) by mouth daily as needed for anxiety. May also take 0.5 tablets (0.5 mg total) at bedtime as needed for anxiety., Disp: 45 tablet, Rfl: 0   ergocalciferol (VITAMIN D2) 1.25 MG (50000 UT) capsule, Take 50,000 Units by mouth once a week., Disp: , Rfl:    famotidine (PEPCID) 40 MG tablet, Take 1 tablet (40 mg total) by mouth daily., Disp: 30 tablet, Rfl: 0   fluticasone  (FLONASE ) 50 MCG/ACT nasal spray, Place 2 sprays into both nostrils daily., Disp: 16 g, Rfl: 0   linaclotide (LINZESS) 72 MCG capsule, Take 72 mcg by mouth daily before breakfast., Disp: , Rfl:    Multiple Vitamins-Minerals (CENTRUM SILVER 50+WOMEN PO), Take by mouth daily., Disp: , Rfl:    ondansetron  (ZOFRAN -ODT) 4 MG disintegrating tablet, Take 1 tablet (4 mg total) by mouth every 8 (eight) hours as needed for nausea or vomiting., Disp: 30 tablet, Rfl: 0   pravastatin (PRAVACHOL) 20 MG tablet, Take 20 mg by mouth daily., Disp: , Rfl:    rizatriptan (MAXALT) 10 MG tablet, Take 10 mg by mouth as needed for migraine. May repeat in 2 hours if needed, Disp: , Rfl:    sucralfate  (CARAFATE ) 1 GM/10ML suspension, Take 10 mLs (1 g total) by mouth 4 (four) times daily -  with meals and at bedtime for 10 days., Disp: 400 mL, Rfl: 0   venlafaxine  XR (EFFEXOR -XR) 75 MG 24 hr capsule, Take 1 capsule (75 mg total) by mouth daily with breakfast., Disp: 30 capsule, Rfl: 5   Vonoprazan Fumarate 10 MG TABS, Take 10 mg by mouth daily., Disp: 30 tablet, Rfl: 0  Observations/Objective: Patient is well-developed, well-nourished in no acute distress.  Resting comfortably at home.  Head is normocephalic, atraumatic.  No labored breathing.  Speech is clear and coherent with logical content.  Patient is alert and oriented at baseline.    Assessment and Plan: 1. Muscle spasms of neck (Primary) - baclofen  (LIORESAL) 10 MG tablet; Take 1 tablet (10 mg total) by mouth 3 (three) times daily.  Dispense: 30 each; Refill: 0  2. Hiatal hernia  3. Gastroesophageal reflux disease with esophagitis without hemorrhage  - Add Baclofen for muscle spasms and tension of neck and shoulders - Continue other medications for GERD - Follow up with PCP for referral  Follow Up Instructions: I discussed the assessment and treatment plan with the patient. The patient was provided an opportunity to ask questions and all were answered. The patient agreed with the plan and demonstrated an understanding of the instructions.  A copy of instructions were sent to the patient via MyChart unless otherwise noted below.    The patient was advised to call back or seek an in-person evaluation if the symptoms worsen or if the condition fails to improve as anticipated.  Angelia Kelp, [name]

## 2023-10-05 NOTE — Patient Instructions (Signed)
 Rebecca Barrera, thank you for joining Angelia Kelp, [name] for today's virtual visit.  While this provider is not your primary care provider (PCP), if your PCP is located in our provider database this encounter information will be shared with them immediately following your visit.   A [facility] MyChart account gives you access to today's visit and all your visits, tests, and labs performed at [facility] " click here if you don't have a [facility] MyChart account or go to [email or web address]  Consent: (Patient) Rebecca Barrera provided verbal consent for this virtual visit at the beginning of the encounter.  Current Medications:  Current Outpatient Medications:    baclofen (LIORESAL) 10 MG tablet, Take 1 tablet (10 mg total) by mouth 3 (three) times daily., Disp: 30 each, Rfl: 0   [START ON 10/06/2023] buPROPion  (WELLBUTRIN  XL) 150 MG 24 hr tablet, Take 1 tablet (150 mg total) by mouth daily. Total of 450 mg daily. Take along with 300 mg tab, Disp: 30 tablet, Rfl: 5   buPROPion  (WELLBUTRIN  XL) 300 MG 24 hr tablet, Take 1 tablet (300 mg total) by mouth daily. Total of 450 mg daily. Take along with 150 mg tab, Disp: 30 tablet, Rfl: 5   [START ON 10/06/2023] clonazePAM  (KLONOPIN ) 1 MG tablet, May take 1 tablet (1 mg total) by mouth daily as needed for anxiety. May also take 0.5 tablets (0.5 mg total) at bedtime as needed for anxiety., Disp: 45 tablet, Rfl: 0   ergocalciferol (VITAMIN D2) 1.25 MG (50000 UT) capsule, Take 50,000 Units by mouth once a week., Disp: , Rfl:    famotidine (PEPCID) 40 MG tablet, Take 1 tablet (40 mg total) by mouth daily., Disp: 30 tablet, Rfl: 0   fluticasone  (FLONASE ) 50 MCG/ACT nasal spray, Place 2 sprays into both nostrils daily., Disp: 16 g, Rfl: 0   linaclotide (LINZESS) 72 MCG capsule, Take 72 mcg by mouth daily before breakfast., Disp: , Rfl:    Multiple Vitamins-Minerals (CENTRUM SILVER 50+WOMEN PO), Take by mouth daily., Disp: , Rfl:     ondansetron  (ZOFRAN -ODT) 4 MG disintegrating tablet, Take 1 tablet (4 mg total) by mouth every 8 (eight) hours as needed for nausea or vomiting., Disp: 30 tablet, Rfl: 0   pravastatin (PRAVACHOL) 20 MG tablet, Take 20 mg by mouth daily., Disp: , Rfl:    rizatriptan (MAXALT) 10 MG tablet, Take 10 mg by mouth as needed for migraine. May repeat in 2 hours if needed, Disp: , Rfl:    sucralfate  (CARAFATE ) 1 GM/10ML suspension, Take 10 mLs (1 g total) by mouth 4 (four) times daily -  with meals and at bedtime for 10 days., Disp: 400 mL, Rfl: 0   venlafaxine  XR (EFFEXOR -XR) 75 MG 24 hr capsule, Take 1 capsule (75 mg total) by mouth daily with breakfast., Disp: 30 capsule, Rfl: 5   Vonoprazan Fumarate 10 MG TABS, Take 10 mg by mouth daily., Disp: 30 tablet, Rfl: 0   Medications ordered in this encounter:  Meds ordered this encounter  Medications   baclofen (LIORESAL) 10 MG tablet    Sig: Take 1 tablet (10 mg total) by mouth 3 (three) times daily.    Dispense:  30 each    Refill:  0    Supervising Provider:   Corine Dice [phone]     *If you need refills on other medications prior to your next appointment, please contact your pharmacy*  Follow-Up: Call back or seek an in-person evaluation if the symptoms worsen  or if the condition fails to improve as anticipated.  [facility] [phone]  Other Instructions  Hiatal Hernia  A hiatal hernia occurs when part of the stomach slides above the muscle that separates the abdomen from the chest (diaphragm). A person can be born with a hiatal hernia (congenital), or it may develop over time. In almost all cases of hiatal hernia, only the top part of the stomach pushes through the diaphragm. Many people have a hiatal hernia with no symptoms. The larger the hernia, the more likely it is that you will have symptoms. In some cases, a hiatal hernia allows stomach acid to flow back into the tube that carries food from your mouth to your  stomach (esophagus). This may cause heartburn symptoms. The development of heartburn symptoms may mean that you have a condition called gastroesophageal reflux disease (GERD). What are the causes? This condition is caused by a weakness in the opening (hiatus) where the esophagus passes through the diaphragm to attach to the upper part of the stomach. A person may be born with a weakness in the hiatus, or a weakness can develop over time. What increases the risk? This condition is more likely to develop in: Older people. Age is a major risk factor for a hiatal hernia, especially if you are over the age of 49. Pregnant women. People who are overweight. People who have frequent constipation. What are the signs or symptoms? Symptoms of this condition usually develop in the form of GERD symptoms. Symptoms include: Heartburn. Upset stomach (indigestion). Trouble swallowing. Coughing or wheezing. Wheezing is making high-pitched whistling sounds when you breathe. Sore throat. Chest pain. Nausea and vomiting. How is this diagnosed? This condition may be diagnosed during testing for GERD. Tests that may be done include: X-rays of your stomach or chest. An upper gastrointestinal (GI) series. This is an X-ray exam of your GI tract that is taken after you swallow a chalky liquid that shows up clearly on the X-ray. Endoscopy. This is a procedure to look into your stomach using a thin, flexible tube that has a tiny camera and light on the end of it. How is this treated? This condition may be treated by: Dietary and lifestyle changes to help reduce GERD symptoms. Medicines. These may include: Over-the-counter antacids. Medicines that make your stomach empty more quickly. Medicines that block the production of stomach acid (H2 blockers). Stronger medicines to reduce stomach acid (proton pump inhibitors). Surgery to repair the hernia, if other treatments are not helping. If you have no symptoms, you  may not need treatment. Follow these instructions at home: Lifestyle and activity Do not use any products that contain nicotine or tobacco. These products include cigarettes, chewing tobacco, and vaping devices, such as e-cigarettes. If you need help quitting, ask your health care provider. Try to achieve and maintain a healthy body weight. Avoid putting pressure on your abdomen. Anything that puts pressure on your abdomen increases the amount of acid that may be pushed up into your esophagus. Avoid bending over, especially after eating. Raise the head of your bed by putting blocks under the legs. This keeps your head and esophagus higher than your stomach. Do not wear tight clothing around your chest or stomach. Try not to strain when having a bowel movement, when urinating, or when lifting heavy objects. Eating and drinking Avoid foods that can worsen GERD symptoms. These may include: Fatty foods, like fried foods. Citrus fruits, like oranges or lemon. Other foods and drinks  that contain acid, like orange juice or tomatoes. Spicy food. Chocolate. Eat frequent small meals instead of three large meals a day. This helps prevent your stomach from getting too full. Eat slowly. Do not lie down right after eating. Do not eat 1-2 hours before bed. Do not drink beverages with caffeine . These include cola, coffee, cocoa, and tea. Do not drink alcohol. General instructions Take over-the-counter and prescription medicines only as told by your health care provider. Keep all follow-up visits. Your health care provider will want to check that any new prescribed medicines are helping your symptoms. Contact a health care provider if: Your symptoms are not controlled with medicines or lifestyle changes. You are having trouble swallowing. You have coughing or wheezing that will not go away. Your pain is getting worse. Your pain spreads to your arms, neck, jaw, teeth, or back. You feel nauseous or you  vomit. Get help right away if: You have shortness of breath. You vomit blood. You have bright red blood in your stools. You have black, tarry stools. These symptoms may be an emergency. Get help right away. Call 911. Do not wait to see if the symptoms will go away. Do not drive yourself to the hospital. Summary A hiatal hernia occurs when part of the stomach slides above the muscle that separates the abdomen from the chest. A person may be born with a weakness in the hiatus, or a weakness can develop over time. Symptoms of a hiatal hernia may include heartburn, trouble swallowing, or sore throat. Management of a hiatal hernia includes eating frequent small meals instead of three large meals a day. Get help right away if you vomit blood, have bright red blood in your stools, or have black, tarry stools. This information is not intended to replace advice given to you by your health care provider. Make sure you discuss any questions you have with your health care provider. Document Revised: 07/16/2021 Document Reviewed: 07/16/2021 Elsevier Patient Education  2024 [employer]   If you have been instructed to have an in-person evaluation today at a local Urgent Care facility, please use the link below. It will take you to a list of all of our available [facility], including address, phone number and hours of operation. Please do not delay care.  [facility]  If you or a family member do not have a primary care provider, use the link below to schedule a visit and establish care. When you choose a [facility] primary care physician or advanced practice provider, you gain a long-term partner in health. Find a Primary Care Provider  Learn more about [facility] in-office and virtual care options:  - Get Care Now

## 2023-10-07 ENCOUNTER — Encounter

## 2023-10-07 ENCOUNTER — Ambulatory Visit (HOSPITAL_COMMUNITY)

## 2023-10-11 ENCOUNTER — Telehealth: Admitting: Physician Assistant

## 2023-10-11 ENCOUNTER — Emergency Department (HOSPITAL_COMMUNITY)

## 2023-10-11 ENCOUNTER — Other Ambulatory Visit: Payer: Self-pay

## 2023-10-11 ENCOUNTER — Encounter (HOSPITAL_COMMUNITY): Payer: Self-pay | Admitting: Emergency Medicine

## 2023-10-11 ENCOUNTER — Observation Stay (HOSPITAL_COMMUNITY)
Admission: EM | Admit: 2023-10-11 | Discharge: 2023-10-12 | Disposition: A | Discharge status: Home / Self Care | Attending: Internal Medicine | Admitting: Internal Medicine

## 2023-10-11 DIAGNOSIS — E876 Hypokalemia: Principal | ICD-10-CM | POA: Diagnosis present

## 2023-10-11 DIAGNOSIS — K449 Diaphragmatic hernia without obstruction or gangrene: Principal | ICD-10-CM

## 2023-10-11 DIAGNOSIS — Z79899 Other long term (current) drug therapy: Secondary | ICD-10-CM | POA: Diagnosis not present

## 2023-10-11 DIAGNOSIS — R1110 Vomiting, unspecified: Secondary | ICD-10-CM | POA: Diagnosis not present

## 2023-10-11 DIAGNOSIS — F411 Generalized anxiety disorder: Secondary | ICD-10-CM | POA: Diagnosis present

## 2023-10-11 DIAGNOSIS — R12 Heartburn: Secondary | ICD-10-CM | POA: Diagnosis not present

## 2023-10-11 DIAGNOSIS — Z87891 Personal history of nicotine dependence: Secondary | ICD-10-CM | POA: Insufficient documentation

## 2023-10-11 DIAGNOSIS — K56609 Unspecified intestinal obstruction, unspecified as to partial versus complete obstruction: Secondary | ICD-10-CM

## 2023-10-11 DIAGNOSIS — F331 Major depressive disorder, recurrent, moderate: Secondary | ICD-10-CM | POA: Diagnosis not present

## 2023-10-11 DIAGNOSIS — R109 Unspecified abdominal pain: Secondary | ICD-10-CM | POA: Diagnosis present

## 2023-10-11 LAB — COMPREHENSIVE METABOLIC PANEL WITH GFR
ALT: 22 U/L (ref 0–44)
AST: 19 U/L (ref 15–41)
Albumin: 3.8 g/dL (ref 3.5–5.0)
Alkaline Phosphatase: 57 U/L (ref 38–126)
Anion gap: 12 (ref 5–15)
BUN: 9 mg/dL (ref 6–20)
CO2: 27 mmol/L (ref 22–32)
Calcium: 9.4 mg/dL (ref 8.9–10.3)
Chloride: 103 mmol/L (ref 98–111)
Creatinine, Ser: 0.85 mg/dL (ref 0.44–1.00)
GFR, Estimated: >60 mL/min (ref >60)
Glucose, Bld: 117 mg/dL — ABNORMAL HIGH (ref 70–99)
Potassium: 2.7 mmol/L — CL (ref 3.5–5.1)
Sodium: 142 mmol/L (ref 135–145)
Total Bilirubin: 0.6 mg/dL (ref 0.0–1.2)
Total Protein: 6.7 g/dL (ref 6.5–8.1)

## 2023-10-11 LAB — CBC WITH DIFFERENTIAL/PLATELET
Abs Immature Granulocytes: 0.02 10*3/uL (ref 0.00–0.07)
Basophils Absolute: 0 10*3/uL (ref 0.0–0.1)
Basophils Relative: 0 %
Eosinophils Absolute: 0.1 10*3/uL (ref 0.0–0.5)
Eosinophils Relative: 1 %
HCT: 42.5 % (ref 36.0–46.0)
Hemoglobin: 15.1 g/dL — ABNORMAL HIGH (ref 12.0–15.0)
Immature Granulocytes: 0 %
Lymphocytes Relative: 20 %
Lymphs Abs: 1.5 10*3/uL (ref 0.7–4.0)
MCH: 32.1 pg (ref 26.0–34.0)
MCHC: 35.5 g/dL (ref 30.0–36.0)
MCV: 90.4 fL (ref 80.0–100.0)
Monocytes Absolute: 0.6 10*3/uL (ref 0.1–1.0)
Monocytes Relative: 8 %
Neutro Abs: 5 10*3/uL (ref 1.7–7.7)
Neutrophils Relative %: 71 %
Platelets: 208 10*3/uL (ref 150–400)
RBC: 4.7 MIL/uL (ref 3.87–5.11)
RDW: 12.1 % (ref 11.5–15.5)
WBC: 7.2 10*3/uL (ref 4.0–10.5)
nRBC: 0 % (ref 0.0–0.2)

## 2023-10-11 LAB — MAGNESIUM: Magnesium: 1.5 mg/dL — ABNORMAL LOW (ref 1.7–2.4)

## 2023-10-11 LAB — LIPASE, BLOOD: Lipase: 33 U/L (ref 11–51)

## 2023-10-11 MED ORDER — MORPHINE SULFATE (PF) 4 MG/ML IV SOLN
4.0000 mg | Freq: Once | INTRAVENOUS | Status: AC
  Administered 2023-10-11: 4 mg via INTRAVENOUS
  Filled 2023-10-11: qty 1

## 2023-10-11 MED ORDER — ONDANSETRON HCL 4 MG/2ML IJ SOLN
4.0000 mg | Freq: Once | INTRAMUSCULAR | Status: AC
  Administered 2023-10-11: 4 mg via INTRAVENOUS
  Filled 2023-10-11: qty 2

## 2023-10-11 MED ORDER — POTASSIUM CHLORIDE 10 MEQ/100ML IV SOLN
10.0000 meq | Freq: Once | INTRAVENOUS | Status: AC
  Administered 2023-10-11: 10 meq via INTRAVENOUS
  Filled 2023-10-11: qty 100

## 2023-10-11 MED ORDER — IOHEXOL 300 MG/ML  SOLN
100.0000 mL | Freq: Once | INTRAMUSCULAR | Status: AC | PRN
  Administered 2023-10-11: 100 mL via INTRAVENOUS

## 2023-10-11 NOTE — ED Provider Notes (Signed)
  Provider Note MRN:  [id number]  Arrival date & time: 10/11/23    ED Course and Medical Decision Making  Assumed care of patient at sign-out or upon transfer.  Severe upper abdominal pain, history of hiatal hernia repair awaiting CT.  Procedures  Final Clinical Impressions(s) / ED Diagnoses  No diagnosis found.  ED Discharge Orders     None       Discharge Instructions   None     Merrick Abe. Harless Lien, MD [facility] Emergency Medicine [facility] [email or web address]

## 2023-10-11 NOTE — ED Triage Notes (Signed)
 Pt c/o abd pain with nausea. Pt c/o acid reflux.

## 2023-10-11 NOTE — Progress Notes (Signed)
 The patient no-showed for appointment despite this provider sending direct link,  and waiting for at least 10 minutes from appointment time for patient to join. They will be marked as a NS for this appointment/time.   Kasandra Knudsen Mayers, [name]

## 2023-10-11 NOTE — ED Provider Notes (Signed)
 [place] EMERGENCY DEPARTMENT AT [facility] Provider Note   CSN: [id number] Arrival date & time: 10/11/23  2118     History  Chief Complaint  Patient presents with   Abdominal Pain    Rebecca Barrera is a 62 y.o. female.  She has PMH of anxiety, heel hernia, anemia, migraines.  She has had hiatal hernia repair laparoscopically in 2010.  She resents the ER complaining of upper abdominal pain that started this morning and gradually worsened throughout the day.  She describes it as a waxing and waning pain that when it comes on is "excruciating".  She states it "feels like my stomach is moving".  She did have 1 episode of vomiting today and states it was clear mucus.  Denies any blood in her stool.  Denies urinary symptoms.  She has not had a fever or chills.  Patient states she was seen here about a week ago for acid reflux, she states her symptoms went away completely with her treatment and this feels nothing like that.  She has no chest pain, no shortness of breath, no pain in her back, no dizziness or other associated symptoms.   Abdominal Pain      Home Medications Prior to Admission medications   Medication Sig Start Date End Date Taking? Authorizing Provider  baclofen  (LIORESAL ) 10 MG tablet Take 1 tablet (10 mg total) by mouth 3 (three) times daily. 10/05/23   Angelia Kelp, [name]  buPROPion  (WELLBUTRIN  XL) 150 MG 24 hr tablet Take 1 tablet (150 mg total) by mouth daily. Total of 450 mg daily. Take along with 300 mg tab 10/06/23 04/03/24  Todd Fossa, MD  buPROPion  (WELLBUTRIN  XL) 300 MG 24 hr tablet Take 1 tablet (300 mg total) by mouth daily. Total of 450 mg daily. Take along with 150 mg tab 06/10/23 12/07/23  Todd Fossa, MD  clonazePAM  (KLONOPIN ) 1 MG tablet May take 1 tablet (1 mg total) by mouth daily as needed for anxiety. May also take 0.5 tablets (0.5 mg total) at bedtime as needed for anxiety. 10/06/23 11/05/23  Hisada, Reina, MD  ergocalciferol (VITAMIN D2) 1.25  MG (50000 UT) capsule Take 50,000 Units by mouth once a week.    [provider]  famotidine  (PEPCID ) 40 MG tablet Take 1 tablet (40 mg total) by mouth daily. 10/03/23   Fleming, Zelda W, NP  fluticasone  (FLONASE ) 50 MCG/ACT nasal spray Place 2 sprays into both nostrils daily. 08/11/23   Angelia Kelp, [name]  linaclotide (LINZESS) 72 MCG capsule Take 72 mcg by mouth daily before breakfast.    [provider]  Multiple Vitamins-Minerals (CENTRUM SILVER 50+WOMEN PO) Take by mouth daily.    [provider]  ondansetron  (ZOFRAN -ODT) 4 MG disintegrating tablet Take 1 tablet (4 mg total) by mouth every 8 (eight) hours as needed for nausea or vomiting. 10/03/23   Fleming, Zelda W, NP  pravastatin (PRAVACHOL) 20 MG tablet Take 20 mg by mouth daily.    [provider]  rizatriptan (MAXALT) 10 MG tablet Take 10 mg by mouth as needed for migraine. May repeat in 2 hours if needed    [provider]  sucralfate  (CARAFATE ) 1 GM/10ML suspension Take 10 mLs (1 g total) by mouth 4 (four) times daily -  with meals and at bedtime for 10 days. 10/02/23 10/12/23  Blair, Diane W, FNP  venlafaxine  XR (EFFEXOR -XR) 75 MG 24 hr capsule Take 1 capsule (75 mg total) by mouth daily with breakfast. 06/14/23  12/11/23  Todd Fossa, MD  Vonoprazan Fumarate  10 MG TABS Take 10 mg by mouth daily. 10/03/23   Dorenda Gandy, MD      Allergies    Aspirin, Hydrocodone -acetaminophen , Lorazepam, and Vicodin [hydrocodone -acetaminophen ]    Review of Systems   Review of Systems  Gastrointestinal:  Positive for abdominal pain.    Physical Exam Updated Vital Signs BP 132/87   Pulse 99   Temp 98.9 F (37.2 C) (Oral)   Resp 16   Ht 5' (1.524 m)   Wt 76 kg   SpO2 96%   BMI 32.72 kg/m  Physical Exam Vitals and nursing note reviewed.  Constitutional:      General: She is not in acute distress.    Appearance: She is well-developed.  HENT:     Head: Normocephalic and atraumatic.  Eyes:      Conjunctiva/sclera: Conjunctivae normal.  Cardiovascular:     Rate and Rhythm: Normal rate and regular rhythm.     Heart sounds: No murmur heard. Pulmonary:     Effort: Pulmonary effort is normal. No respiratory distress.     Breath sounds: Normal breath sounds.  Abdominal:     Palpations: Abdomen is soft.     Tenderness: There is no abdominal tenderness.  Musculoskeletal:        General: No swelling.     Cervical back: Neck supple.  Skin:    General: Skin is warm and dry.     Capillary Refill: Capillary refill takes less than 2 seconds.  Neurological:     General: No focal deficit present.     Mental Status: She is alert and oriented to person, place, and time.  Psychiatric:        Mood and Affect: Mood normal.     ED Results / Procedures / Treatments   Labs (all labs ordered are listed, but only abnormal results are displayed) Labs Reviewed  CBC WITH DIFFERENTIAL/PLATELET - Abnormal; Notable for the following components:      Result Value   Hemoglobin 15.1 (*)    All other components within normal limits  COMPREHENSIVE METABOLIC PANEL WITH GFR - Abnormal; Notable for the following components:   Potassium 2.7 (*)    Glucose, Bld 117 (*)    All other components within normal limits  LIPASE, BLOOD  URINALYSIS, ROUTINE W REFLEX MICROSCOPIC  MAGNESIUM    EKG None  Radiology No results found.  Procedures Procedures    Medications Ordered in ED Medications  potassium chloride 10 mEq in 100 mL IVPB (has no administration in time range)  ondansetron  (ZOFRAN ) injection 4 mg (4 mg Intravenous Given 10/11/23 2222)  morphine (PF) 4 MG/ML injection 4 mg (4 mg Intravenous Given 10/11/23 2221)  iohexol (OMNIPAQUE) 300 MG/ML solution 100 mL (100 mLs Intravenous Contrast Given 10/11/23 2258)    ED Course/ Medical Decision Making/ A&P                                 Medical Decision Making This patient presents to the ED for concern of abdominal pain, this involves an  extensive number of treatment options, and is a complaint that carries with it a high risk of complications and morbidity.  The differential diagnosis includes gastritis, gastroenteritis, PUD, cholecystitis, pancreatitis,other   Co morbidities that complicate the patient evaluation :   GERD, anxiety   Additional history obtained:  Additional history obtained from EMR External records from outside source  obtained and reviewed including notes and labs   Lab Tests:  I Ordered, and personally interpreted labs.  The pertinent results include: CBC-normal Lipids-normal CMP-potassium low at 2.7   Imaging Studies ordered:  I ordered imaging studies including CT abdomen pelvis which is pending at this time  Cardiac Monitoring: / EKG:  The patient was maintained on a cardiac monitor.  I personally viewed and interpreted the cardiac monitored which showed an underlying rhythm of:-Sinus rhythm on cardiac monitoring     Problem List / ED Course / Critical interventions / Medication management  Having epigastric abdominal pain, with epigastric tenderness, normal bowel sounds, she appears anxious.  Complaining of pain, labs show hypokalemia which is being repleted.  CT ordered and is pending results at this time. Signed out to oncoming team I ordered medication including morphine  for pain  Reevaluation of the patient after these medicines showed that the patient improved I have reviewed the patients home medicines and have made adjustments as needed       Amount and/or Complexity of Data Reviewed Labs: ordered. Radiology: ordered.  Risk Prescription drug management.           Final Clinical Impression(s) / ED Diagnoses Final diagnoses:  None    Rx / DC Orders ED Discharge Orders     None         Aimee Houseman, [name] 10/11/23 2326    Merdis Stalling, MD 10/11/23 [phone]

## 2023-10-12 ENCOUNTER — Telehealth: Admitting: Psychiatry

## 2023-10-12 ENCOUNTER — Encounter (HOSPITAL_COMMUNITY): Payer: Self-pay

## 2023-10-12 ENCOUNTER — Encounter (HOSPITAL_COMMUNITY): Payer: Self-pay | Admitting: Family Medicine

## 2023-10-12 ENCOUNTER — Other Ambulatory Visit: Payer: Self-pay

## 2023-10-12 ENCOUNTER — Encounter: Payer: Self-pay | Admitting: Psychiatry

## 2023-10-12 ENCOUNTER — Encounter (INDEPENDENT_AMBULATORY_CARE_PROVIDER_SITE_OTHER): Payer: Self-pay | Admitting: *Deleted

## 2023-10-12 ENCOUNTER — Emergency Department (HOSPITAL_COMMUNITY)
Admission: EM | Admit: 2023-10-12 | Discharge: 2023-10-12 | Discharge status: Against Medical Advice | Attending: Emergency Medicine | Admitting: Emergency Medicine

## 2023-10-12 DIAGNOSIS — F331 Major depressive disorder, recurrent, moderate: Secondary | ICD-10-CM | POA: Diagnosis not present

## 2023-10-12 DIAGNOSIS — E876 Hypokalemia: Principal | ICD-10-CM | POA: Diagnosis present

## 2023-10-12 DIAGNOSIS — F3341 Major depressive disorder, recurrent, in partial remission: Secondary | ICD-10-CM

## 2023-10-12 DIAGNOSIS — R1110 Vomiting, unspecified: Secondary | ICD-10-CM | POA: Insufficient documentation

## 2023-10-12 DIAGNOSIS — R109 Unspecified abdominal pain: Secondary | ICD-10-CM

## 2023-10-12 DIAGNOSIS — R12 Heartburn: Secondary | ICD-10-CM | POA: Diagnosis present

## 2023-10-12 DIAGNOSIS — K449 Diaphragmatic hernia without obstruction or gangrene: Principal | ICD-10-CM

## 2023-10-12 DIAGNOSIS — F411 Generalized anxiety disorder: Secondary | ICD-10-CM | POA: Diagnosis not present

## 2023-10-12 DIAGNOSIS — Z5321 Procedure and treatment not carried out due to patient leaving prior to being seen by health care provider: Secondary | ICD-10-CM | POA: Insufficient documentation

## 2023-10-12 DIAGNOSIS — F419 Anxiety disorder, unspecified: Secondary | ICD-10-CM | POA: Diagnosis not present

## 2023-10-12 DIAGNOSIS — F4381 Prolonged grief disorder: Secondary | ICD-10-CM

## 2023-10-12 DIAGNOSIS — K56609 Unspecified intestinal obstruction, unspecified as to partial versus complete obstruction: Secondary | ICD-10-CM

## 2023-10-12 LAB — CBC
HCT: 41.8 % (ref 36.0–46.0)
HCT: 42 % (ref 36.0–46.0)
Hemoglobin: 13.9 g/dL (ref 12.0–15.0)
Hemoglobin: 14.4 g/dL (ref 12.0–15.0)
MCH: 31 pg (ref 26.0–34.0)
MCH: 31.9 pg (ref 26.0–34.0)
MCHC: 33.3 g/dL (ref 30.0–36.0)
MCHC: 34.3 g/dL (ref 30.0–36.0)
MCV: 93.1 fL (ref 80.0–100.0)
MCV: 93.1 fL (ref 80.0–100.0)
Platelets: 195 10*3/uL (ref 150–400)
Platelets: 210 10*3/uL (ref 150–400)
RBC: 4.49 MIL/uL (ref 3.87–5.11)
RBC: 4.51 MIL/uL (ref 3.87–5.11)
RDW: 12.2 % (ref 11.5–15.5)
RDW: 12.4 % (ref 11.5–15.5)
WBC: 7 10*3/uL (ref 4.0–10.5)
WBC: 6.9 10*3/uL (ref 4.0–10.5)
nRBC: 0 % (ref 0.0–0.2)
nRBC: 0 % (ref 0.0–0.2)

## 2023-10-12 LAB — BASIC METABOLIC PANEL WITH GFR
Anion gap: 12 (ref 5–15)
BUN: 8 mg/dL (ref 6–20)
CO2: 26 mmol/L (ref 22–32)
Calcium: 9 mg/dL (ref 8.9–10.3)
Chloride: 103 mmol/L (ref 98–111)
Creatinine, Ser: 0.74 mg/dL (ref 0.44–1.00)
GFR, Estimated: >60 mL/min (ref >60)
Glucose, Bld: 130 mg/dL — ABNORMAL HIGH (ref 70–99)
Potassium: 4 mmol/L (ref 3.5–5.1)
Sodium: 141 mmol/L (ref 135–145)

## 2023-10-12 LAB — COMPREHENSIVE METABOLIC PANEL WITH GFR
ALT: 21 U/L (ref 0–44)
AST: 17 U/L (ref 15–41)
Albumin: 3.7 g/dL (ref 3.5–5.0)
Alkaline Phosphatase: 57 U/L (ref 38–126)
Anion gap: 11 (ref 5–15)
BUN: 8 mg/dL (ref 6–20)
CO2: 27 mmol/L (ref 22–32)
Calcium: 9.1 mg/dL (ref 8.9–10.3)
Chloride: 103 mmol/L (ref 98–111)
Creatinine, Ser: 0.68 mg/dL (ref 0.44–1.00)
GFR, Estimated: >60 mL/min (ref >60)
Glucose, Bld: 115 mg/dL — ABNORMAL HIGH (ref 70–99)
Potassium: 3.6 mmol/L (ref 3.5–5.1)
Sodium: 141 mmol/L (ref 135–145)
Total Bilirubin: 0.7 mg/dL (ref 0.0–1.2)
Total Protein: 6.7 g/dL (ref 6.5–8.1)

## 2023-10-12 LAB — LIPASE, BLOOD: Lipase: 32 U/L (ref 11–51)

## 2023-10-12 LAB — HIV ANTIBODY (ROUTINE TESTING W REFLEX): HIV Screen 4th Generation wRfx: NONREACTIVE

## 2023-10-12 LAB — MAGNESIUM: Magnesium: 2.4 mg/dL (ref 1.7–2.4)

## 2023-10-12 MED ORDER — FENTANYL CITRATE PF 50 MCG/ML IJ SOSY: 12.5000 ug | PREFILLED_SYRINGE | INTRAMUSCULAR | Status: DC | PRN

## 2023-10-12 MED ORDER — MAGNESIUM SULFATE 2 GM/50ML IV SOLN
2.0000 g | Freq: Once | INTRAVENOUS | Status: AC
  Administered 2023-10-12: 2 g via INTRAVENOUS
  Filled 2023-10-12: qty 50

## 2023-10-12 MED ORDER — POTASSIUM CHLORIDE 20 MEQ PO PACK
20.0000 meq | PACK | Freq: Two times a day (BID) | ORAL | Status: DC
  Administered 2023-10-12: 20 meq via ORAL
  Filled 2023-10-12: qty 1

## 2023-10-12 MED ORDER — POTASSIUM CHLORIDE IN NACL 20-0.9 MEQ/L-% IV SOLN
INTRAVENOUS | Status: DC
  Filled 2023-10-12: qty 1000

## 2023-10-12 MED ORDER — POTASSIUM CHLORIDE 10 MEQ/100ML IV SOLN
10.0000 meq | INTRAVENOUS | Status: AC
  Administered 2023-10-12 (×3): 10 meq via INTRAVENOUS
  Filled 2023-10-12 (×3): qty 100

## 2023-10-12 MED ORDER — BUPROPION HCL ER (XL) 150 MG PO TB24
450.0000 mg | ORAL_TABLET | Freq: Every day | ORAL | 0 refills | Status: DC
Start: 2023-10-12 — End: 2023-10-28

## 2023-10-12 MED ORDER — PROCHLORPERAZINE EDISYLATE 10 MG/2ML IJ SOLN: 5.0000 mg | Freq: Four times a day (QID) | INTRAMUSCULAR | Status: DC | PRN

## 2023-10-12 MED ORDER — ACETAMINOPHEN 650 MG RE SUPP: 650.0000 mg | Freq: Four times a day (QID) | RECTAL | Status: DC | PRN

## 2023-10-12 MED ORDER — SODIUM CHLORIDE 0.9% FLUSH
3.0000 mL | Freq: Two times a day (BID) | INTRAVENOUS | Status: DC
  Administered 2023-10-12: 3 mL via INTRAVENOUS

## 2023-10-12 MED ORDER — ACETAMINOPHEN 325 MG PO TABS: 650.0000 mg | ORAL_TABLET | Freq: Four times a day (QID) | ORAL | Status: DC | PRN

## 2023-10-12 NOTE — ED Notes (Signed)
 Pt not seen in [facility]

## 2023-10-12 NOTE — ED Triage Notes (Signed)
 Pt arrived via [facility] c/o emesis and heart burn. Pt reports she is still unable to have a BM.

## 2023-10-12 NOTE — ED Notes (Signed)
 Date and time results received: 10/11/2023 2247 (use smartphrase ".now" to insert current time)  Test: Potassium Critical Value: 2.7  Name of Provider Notified: Baxter Limber, [name]

## 2023-10-12 NOTE — ED Notes (Signed)
 Pt states she is leaving AMA. Pt informed of risk associated with leaving AMA and hospitalist notified. Pt signed AMA form IV taken out and pt informed if symptoms worsen to please return.

## 2023-10-12 NOTE — Discharge Summary (Signed)
 Physician Discharge Summary   Patient: Rebecca Barrera MRN: [id number] DOB: 1960-10-04  Admit date:     10/11/2023  Discharge date: 10/12/23  Discharge Physician: Justina Oman   PCP: Theoplis Fix, MD   Recommendations at discharge:  Patient left AGAINST MEDICAL ADVICE  Discharge Diagnoses: Principal Problem:   Abdominal pain with vomiting Active Problems:   Paraesophageal hernia   MDD (major depressive disorder), recurrent episode, moderate (HCC)   Anxiety state   Hypokalemia  Brief Hospital admission narrative: As per H&P written by Dr. On 10/12/2023  Rebecca Barrera is a 63 y.o. female with medical history significant for depression, anxiety, migraines, and hiatal hernia who presents with abdominal pain, nausea, and vomiting.   Patient reports developing pain in the left and mid abdomen yesterday.  She has had nausea associated with this and 1 episode of vomiting.  She denies fevers, chills, cough, or shortness of breath.   ED Course: Upon arrival to the ED, patient is found to be afebrile and saturating well on room air with normal HR and stable BP.  Labs are most notable for potassium 2.7, magnesium 1.5, normal creatinine, normal WBC, normal lipase, and normal LFTs.  CT demonstrates large hiatal hernia containing mesenteric fat and transverse colon with findings concerning for possible early or partial colonic obstruction.   Surgery (Dr. Larrie Po) was consulted with ED physician and the patient was treated with morphine, Zofran , and IV potassium.  Symptoms for the most part are improved; were reviewing her chart she was hemodynamically stable and per nursing staff reports demanding to be transferred to [facility]; not availability on our end to make the transfer happen right away.  Patient decided to leave AGAINST MEDICAL ADVICE and pursued care where she felt was more suitable.  Please refer to H&P written by Dr. Brice Campi for further info/details on admission.  Consultants: General Surgery  was consulted by EDP. Procedures performed: See below for x-ray reports Disposition: Patient left AGAINST MEDICAL ADVICE.  DISCHARGE MEDICATION: Allergies as of 10/12/2023       Reactions   Aspirin Other (See Comments)   Due to hernia   Hydrocodone -acetaminophen  Itching   Lorazepam Other (See Comments)   migranes   Vicodin [hydrocodone -acetaminophen ]    Pt states it gives her a headache   Discharge Exam: Filed Weights   10/11/23 2123  Weight: 76 kg   Per chart review vital signs demonstrating temperature of 98, respiratory rate 20, heart rate 93, blood pressure 145/84 and oxygen saturation of 98% on room air.  Patient left AGAINST MEDICAL ADVICE prior to me having the chance to see her .   Condition at discharge: stable  The results of significant diagnostics from this hospitalization (including imaging, microbiology, ancillary and laboratory) are listed below for reference.   Imaging Studies: CT ABDOMEN PELVIS W CONTRAST Result Date: 10/11/2023 CLINICAL DATA:  Generalized abdominal pain and nausea. EXAM: CT ABDOMEN AND PELVIS WITH CONTRAST TECHNIQUE: Multidetector CT imaging of the abdomen and pelvis was performed using the standard protocol following bolus administration of intravenous contrast. RADIATION DOSE REDUCTION: This exam was performed according to the departmental dose-optimization program which includes automated exposure control, adjustment of the mA and/or kV according to patient size and/or use of iterative reconstruction technique. CONTRAST:  100mL OMNIPAQUE IOHEXOL 300 MG/ML  SOLN COMPARISON:  None Available. FINDINGS: Lower chest: Mild atelectatic changes are seen within the posteromedial aspect of the right lung base. Hepatobiliary: No focal liver abnormality is seen. No gallstones, gallbladder wall thickening,  or biliary dilatation. Pancreas: Unremarkable. No pancreatic ductal dilatation or surrounding inflammatory changes. Spleen: Normal in size without focal  abnormality. Adrenals/Urinary Tract: Adrenal glands are unremarkable. Kidneys are normal in size, without renal calculi or hydronephrosis. A 14 mm diameter simple cyst is seen within the left kidney. Bladder is unremarkable. Stomach/Bowel: A large hiatal hernia is seen which contains mesenteric fat and the entire length of a redundant transverse colon. Stomach is within normal limits. Appendix appears normal. Prominent stool and air-filled cecum and ascending colon is seen. An abrupt transition zone is present as the ascending colon enters the previously noted large hiatal hernia (axial CT images 16 through 24, CT series 2/coronal reformatted image 33 through 41, CT series 3). Vascular/Lymphatic: No significant vascular findings are present. No enlarged abdominal or pelvic lymph nodes. Reproductive: Uterus and bilateral adnexa are unremarkable. Other: No abdominal wall hernia or abnormality. There is a very small amount of pelvic free fluid. Musculoskeletal: No acute or significant osseous findings. IMPRESSION: 1. Large hiatal hernia which contains mesenteric fat and the entire length of a redundant transverse colon. 2. Prominent stool and air-filled cecum and ascending colon with an abrupt transition zone as the ascending colon enters the previously noted large hiatal hernia. While this may be transient in nature, an early or partial colonic obstruction cannot be excluded. 3. Very small amount of pelvic free fluid. Electronically Signed   By: Virgle Grime M.D.   On: 10/11/2023 23:38    Microbiology: No results found for this or any previous visit.  Labs: CBC: Recent Labs  Lab 10/11/23 2214 10/12/23 0624  WBC 7.2 7.0  NEUTROABS 5.0  --   HGB 15.1* 13.9  HCT 42.5 41.8  MCV 90.4 93.1  PLT 208 195   Basic Metabolic Panel: Recent Labs  Lab 10/11/23 2214 10/12/23 0624  NA 142 141  K 2.7* 4.0  CL 103 103  CO2 27 26  GLUCOSE 117* 130*  BUN 9 8  CREATININE 0.85 0.74  CALCIUM 9.4 9.0  MG  1.5* 2.4   Liver Function Tests: Recent Labs  Lab 10/11/23 2214  AST 19  ALT 22  ALKPHOS 57  BILITOT 0.6  PROT 6.7  ALBUMIN 3.8   CBG: No results for input(s): "GLUCAP" in the last 168 hours.  Discharge time spent: less than 30 minutes.  Signed: Justina Oman, MD [facility] 10/12/2023

## 2023-10-12 NOTE — ED Notes (Signed)
 Pt is requesting to leave. Dr. Michaelene Admire notified and states if pt requests to leave she is leaving AMA and needs to sign;  pt signed AMA paperwork and states she is going to call her doctor today and go to [facility]  Pt IV removed and pt seen leaving the department with steady gait  Transport service called for pt

## 2023-10-12 NOTE — H&P (Signed)
 History and Physical    Rebecca Barrera QMV:[id number] DOB: Sep 19, 1960 DOA: 10/11/2023  PCP: Theoplis Fix, MD   Patient coming from: Home   Chief Complaint: Abdominal pain, N/V   HPI: Rebecca Barrera is a 63 y.o. female with medical history significant for depression, anxiety, migraines, and hiatal hernia who presents with abdominal pain, nausea, and vomiting.  Patient reports developing pain in the left and mid abdomen yesterday.  She has had nausea associated with this and 1 episode of vomiting.  She denies fevers, chills, cough, or shortness of breath.  ED Course: Upon arrival to the ED, patient is found to be afebrile and saturating well on room air with normal HR and stable BP.  Labs are most notable for potassium 2.7, magnesium 1.5, normal creatinine, normal WBC, normal lipase, and normal LFTs.  CT demonstrates large hiatal hernia containing mesenteric fat and transverse colon with findings concerning for possible early or partial colonic obstruction.  Surgery (Dr. Larrie Po) was consulted with ED physician and the patient was treated with morphine, Zofran , and IV potassium.  Review of Systems:  All other systems reviewed and apart from HPI, are negative.  Past Medical History:  Diagnosis Date   Anemia    Anxiety    Depression    Hiatal hernia    High cholesterol    Leg pain    Migraine    Migraines    Panic attacks    Varicose veins     Past Surgical History:  Procedure Laterality Date   COLONOSCOPY  2008   Dr. Homero Luster   ESOPHAGOGASTRODUODENOSCOPY  2008   Dr. Rehman--> superficial ulceration at the GE junction a large hiatal hernia with dependent segment on the left side with food debris and coffee-ground material, swollen and erythematous folds, few antral erosions.   ESOPHAGOGASTRODUODENOSCOPY  02/08/2008   Normal esophageal mucosa aside from Schatzki's ring not manipulated (the patient not dysphagic) Large diaphragmatic and likely paraesophageal hernia, gastric       mucosa appeared normal, otherwise pylorus patent, normal D1 and D2.   HERNIA REPAIR     HIATAL HERNIA REPAIR  06/2008   paraesophageal hernia repair   TUBAL LIGATION      Social History:   reports that she quit smoking about 11 years ago. Her smoking use included cigarettes. She started smoking about 16 years ago. She has never used smokeless tobacco. She reports that she does not drink alcohol and does not use drugs.  Allergies  Allergen Reactions   Aspirin Other (See Comments)    Due to hernia   Hydrocodone -Acetaminophen  Itching   Lorazepam Other (See Comments)    migranes   Vicodin [Hydrocodone -Acetaminophen ]     Pt states it gives her a headache    Family History  Problem Relation Age of Onset   Hyperlipidemia Mother    Hypertension Mother    Stroke Mother    Dementia Mother    Migraines Mother    Anxiety disorder Mother    Depression Mother    Heart disease Father    Heart disease Sister    Alcohol abuse Sister    Seizures Brother    Epilepsy Brother    Heart disease Brother    Hyperlipidemia Son    Hypertension Son    Colon cancer Neg Hx      Prior to Admission medications   Medication Sig Start Date End Date Taking? Authorizing Provider  baclofen  (LIORESAL ) 10 MG tablet Take 1 tablet (10 mg total) by mouth  3 (three) times daily. 10/05/23   Angelia Kelp, [name]  buPROPion  (WELLBUTRIN  XL) 150 MG 24 hr tablet Take 1 tablet (150 mg total) by mouth daily. Total of 450 mg daily. Take along with 300 mg tab 10/06/23 04/03/24  Todd Fossa, MD  buPROPion  (WELLBUTRIN  XL) 300 MG 24 hr tablet Take 1 tablet (300 mg total) by mouth daily. Total of 450 mg daily. Take along with 150 mg tab 06/10/23 12/07/23  Todd Fossa, MD  clonazePAM  (KLONOPIN ) 1 MG tablet May take 1 tablet (1 mg total) by mouth daily as needed for anxiety. May also take 0.5 tablets (0.5 mg total) at bedtime as needed for anxiety. 10/06/23 11/05/23  Hisada, Reina, MD  ergocalciferol (VITAMIN D2) 1.25 MG (50000 UT)  capsule Take 50,000 Units by mouth once a week.    [provider]  famotidine  (PEPCID ) 40 MG tablet Take 1 tablet (40 mg total) by mouth daily. 10/03/23   Fleming, Zelda W, NP  fluticasone  (FLONASE ) 50 MCG/ACT nasal spray Place 2 sprays into both nostrils daily. 08/11/23   Angelia Kelp, [name]  linaclotide (LINZESS) 72 MCG capsule Take 72 mcg by mouth daily before breakfast.    [provider]  Multiple Vitamins-Minerals (CENTRUM SILVER 50+WOMEN PO) Take by mouth daily.    [provider]  ondansetron  (ZOFRAN -ODT) 4 MG disintegrating tablet Take 1 tablet (4 mg total) by mouth every 8 (eight) hours as needed for nausea or vomiting. 10/03/23   Fleming, Zelda W, NP  pravastatin (PRAVACHOL) 20 MG tablet Take 20 mg by mouth daily.    [provider]  rizatriptan (MAXALT) 10 MG tablet Take 10 mg by mouth as needed for migraine. May repeat in 2 hours if needed    [provider]  sucralfate  (CARAFATE ) 1 GM/10ML suspension Take 10 mLs (1 g total) by mouth 4 (four) times daily -  with meals and at bedtime for 10 days. 10/02/23 10/12/23  Blair, Diane W, FNP  venlafaxine  XR (EFFEXOR -XR) 75 MG 24 hr capsule Take 1 capsule (75 mg total) by mouth daily with breakfast. 06/14/23 12/11/23  Todd Fossa, MD  Vonoprazan Fumarate  10 MG TABS Take 10 mg by mouth daily. 10/03/23   Dorenda Gandy, MD    Physical Exam: Vitals:   10/11/23 2123 10/11/23 2124 10/12/23 0010  BP:  132/87 (!) 149/82  Pulse:  99 96  Resp:  16 17  Temp:  98.9 F (37.2 C)   TempSrc:  Oral   SpO2:  96% 96%  Weight: 76 kg    Height: 5' (1.524 m)      Constitutional: NAD, no diaphoresis   Eyes: PERTLA, lids and conjunctivae normal ENMT: Mucous membranes are moist. Posterior pharynx clear of any exudate or lesions.   Neck: supple, no masses  Respiratory: no wheezing, no crackles. No accessory muscle use.  Cardiovascular: S1 & S2 heard, regular rate and rhythm. No extremity edema.   Abdomen:  Soft, tender, no guarding. Bowel sounds hypoactive.  Musculoskeletal: no clubbing / cyanosis. No joint deformity upper and lower extremities.   Skin: no significant rashes, lesions, ulcers. Warm, dry, well-perfused. Neurologic: CN 2-12 grossly intact. Moving all extremities. Alert and oriented.  Psychiatric: Calm. Cooperative.    Labs and Imaging on Admission: I have personally reviewed following labs and imaging studies  CBC: Recent Labs  Lab 10/11/23 2214  WBC 7.2  NEUTROABS 5.0  HGB 15.1*  HCT 42.5  MCV 90.4  PLT 208   Basic Metabolic Panel: Recent Labs  Lab 10/11/23 2214  NA 142  K 2.7*  CL 103  CO2 27  GLUCOSE 117*  BUN 9  CREATININE 0.85  CALCIUM 9.4  MG 1.5*   GFR: Estimated Creatinine Clearance: 64.1 mL/min (by C-G formula based on SCr of 0.85 mg/dL). Liver Function Tests: Recent Labs  Lab 10/11/23 2214  AST 19  ALT 22  ALKPHOS 57  BILITOT 0.6  PROT 6.7  ALBUMIN 3.8   Recent Labs  Lab 10/11/23 2214  LIPASE 33   No results for input(s): "AMMONIA" in the last 168 hours. Coagulation Profile: No results for input(s): "INR", "PROTIME" in the last 168 hours. Cardiac Enzymes: No results for input(s): "CKTOTAL", "CKMB", "CKMBINDEX", "TROPONINI" in the last 168 hours. BNP (last 3 results) No results for input(s): "PROBNP" in the last 8760 hours. HbA1C: No results for input(s): "HGBA1C" in the last 72 hours. CBG: No results for input(s): "GLUCAP" in the last 168 hours. Lipid Profile: No results for input(s): "CHOL", "HDL", "LDLCALC", "TRIG", "CHOLHDL", "LDLDIRECT" in the last 72 hours. Thyroid Function Tests: No results for input(s): "TSH", "T4TOTAL", "FREET4", "T3FREE", "THYROIDAB" in the last 72 hours. Anemia Panel: No results for input(s): "VITAMINB12", "FOLATE", "FERRITIN", "TIBC", "IRON", "RETICCTPCT" in the last 72 hours. Urine analysis:    Component Value Date/Time   COLORURINE YELLOW 08/19/2012 1412   APPEARANCEUR CLEAR 08/19/2012 1412    LABSPEC 1.020 08/19/2012 1412   PHURINE 7.0 08/19/2012 1412   GLUCOSEU NEGATIVE 08/19/2012 1412   HGBUR NEGATIVE 08/19/2012 1412   BILIRUBINUR NEGATIVE 08/19/2012 1412   KETONESUR >80 (A) 08/19/2012 1412   PROTEINUR NEGATIVE 08/19/2012 1412   UROBILINOGEN 0.2 08/19/2012 1412   NITRITE NEGATIVE 08/19/2012 1412   LEUKOCYTESUR NEGATIVE 08/19/2012 1412   Sepsis Labs: @LABRCNTIP (procalcitonin:4,lacticidven:4) )No results found for this or any previous visit (from the past 240 hours).   Radiological Exams on Admission: CT ABDOMEN PELVIS W CONTRAST Result Date: 10/11/2023 CLINICAL DATA:  Generalized abdominal pain and nausea. EXAM: CT ABDOMEN AND PELVIS WITH CONTRAST TECHNIQUE: Multidetector CT imaging of the abdomen and pelvis was performed using the standard protocol following bolus administration of intravenous contrast. RADIATION DOSE REDUCTION: This exam was performed according to the departmental dose-optimization program which includes automated exposure control, adjustment of the mA and/or kV according to patient size and/or use of iterative reconstruction technique. CONTRAST:  100mL OMNIPAQUE IOHEXOL 300 MG/ML  SOLN COMPARISON:  None Available. FINDINGS: Lower chest: Mild atelectatic changes are seen within the posteromedial aspect of the right lung base. Hepatobiliary: No focal liver abnormality is seen. No gallstones, gallbladder wall thickening, or biliary dilatation. Pancreas: Unremarkable. No pancreatic ductal dilatation or surrounding inflammatory changes. Spleen: Normal in size without focal abnormality. Adrenals/Urinary Tract: Adrenal glands are unremarkable. Kidneys are normal in size, without renal calculi or hydronephrosis. A 14 mm diameter simple cyst is seen within the left kidney. Bladder is unremarkable. Stomach/Bowel: A large hiatal hernia is seen which contains mesenteric fat and the entire length of a redundant transverse colon. Stomach is within normal limits. Appendix appears  normal. Prominent stool and air-filled cecum and ascending colon is seen. An abrupt transition zone is present as the ascending colon enters the previously noted large hiatal hernia (axial CT images 16 through 24, CT series 2/coronal reformatted image 33 through 41, CT series 3). Vascular/Lymphatic: No significant vascular findings are present. No enlarged abdominal or pelvic lymph nodes. Reproductive: Uterus and bilateral adnexa are unremarkable. Other: No abdominal wall hernia or abnormality. There is a very small amount of pelvic free  fluid. Musculoskeletal: No acute or significant osseous findings. IMPRESSION: 1. Large hiatal hernia which contains mesenteric fat and the entire length of a redundant transverse colon. 2. Prominent stool and air-filled cecum and ascending colon with an abrupt transition zone as the ascending colon enters the previously noted large hiatal hernia. While this may be transient in nature, an early or partial colonic obstruction cannot be excluded. 3. Very small amount of pelvic free fluid. Electronically Signed   By: Virgle Grime M.D.   On: 10/11/2023 23:38    Assessment/Plan   1. Abdominal pain with N/V; large hiatal hernia; ?early or partial colonic obstruction  - Continue bowel-rest, IVF hydration, pain-control, and follow-up on surgeon's assessment and recommendations   2. Hypokalemia  - Replacing   3. Depression, anxiety  - Resume home medications when she is able to tolerate oral intake     DVT prophylaxis: SCDs Code Status: Full  Level of Care: Level of care: Telemetry Family Communication: None present  Disposition Plan:  Patient is from: Home  Anticipated d/c is to: home  Anticipated d/c date is: TBD Patient currently: pending surgery consultation, advancement of diet  Consults called: Surgery  Admission status: Observation     Walton Guppy, MD [facility]  10/12/2023, 2:03 AM

## 2023-10-12 NOTE — Telephone Encounter (Signed)
 Could you offer her a sooner appointment? Virtual is fine. Thanks

## 2023-10-12 NOTE — ED Notes (Signed)
 Pt informed this RN that she did not want admitted to this hospital that she was wanting to be transferred to [facility]. Hospitalist notified at this time.

## 2023-10-12 NOTE — ED Notes (Signed)
 Some medications are slightly delayed d/t lack of channel availability
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
## 2023-10-13 ENCOUNTER — Encounter (HOSPITAL_COMMUNITY): Payer: Self-pay | Admitting: Pharmacy Technician

## 2023-10-13 ENCOUNTER — Other Ambulatory Visit: Payer: Self-pay

## 2023-10-13 ENCOUNTER — Emergency Department (HOSPITAL_COMMUNITY)

## 2023-10-13 ENCOUNTER — Emergency Department (HOSPITAL_COMMUNITY)
Admission: EM | Admit: 2023-10-13 | Discharge: 2023-10-13 | Disposition: A | Discharge status: Home / Self Care | Attending: Emergency Medicine | Admitting: Emergency Medicine

## 2023-10-13 DIAGNOSIS — Z87891 Personal history of nicotine dependence: Secondary | ICD-10-CM | POA: Insufficient documentation

## 2023-10-13 DIAGNOSIS — R109 Unspecified abdominal pain: Secondary | ICD-10-CM | POA: Diagnosis present

## 2023-10-13 DIAGNOSIS — K449 Diaphragmatic hernia without obstruction or gangrene: Secondary | ICD-10-CM | POA: Diagnosis not present

## 2023-10-13 DIAGNOSIS — R1084 Generalized abdominal pain: Secondary | ICD-10-CM

## 2023-10-13 LAB — CBC
HCT: 42.5 % (ref 36.0–46.0)
Hemoglobin: 14.1 g/dL (ref 12.0–15.0)
MCH: 31 pg (ref 26.0–34.0)
MCHC: 33.2 g/dL (ref 30.0–36.0)
MCV: 93.4 fL (ref 80.0–100.0)
Platelets: 214 10*3/uL (ref 150–400)
RBC: 4.55 MIL/uL (ref 3.87–5.11)
RDW: 12.4 % (ref 11.5–15.5)
WBC: 7.7 10*3/uL (ref 4.0–10.5)
nRBC: 0 % (ref 0.0–0.2)

## 2023-10-13 LAB — COMPREHENSIVE METABOLIC PANEL WITH GFR
ALT: 19 U/L (ref 0–44)
AST: 16 U/L (ref 15–41)
Albumin: 3.6 g/dL (ref 3.5–5.0)
Alkaline Phosphatase: 48 U/L (ref 38–126)
Anion gap: 9 (ref 5–15)
BUN: 9 mg/dL (ref 6–20)
CO2: 28 mmol/L (ref 22–32)
Calcium: 9.2 mg/dL (ref 8.9–10.3)
Chloride: 106 mmol/L (ref 98–111)
Creatinine, Ser: 0.9 mg/dL (ref 0.44–1.00)
GFR, Estimated: >60 mL/min (ref >60)
Glucose, Bld: 115 mg/dL — ABNORMAL HIGH (ref 70–99)
Potassium: 3.6 mmol/L (ref 3.5–5.1)
Sodium: 143 mmol/L (ref 135–145)
Total Bilirubin: 0.6 mg/dL (ref 0.0–1.2)
Total Protein: 6.4 g/dL — ABNORMAL LOW (ref 6.5–8.1)

## 2023-10-13 LAB — URINALYSIS, ROUTINE W REFLEX MICROSCOPIC
Bilirubin Urine: NEGATIVE
Glucose, UA: NEGATIVE mg/dL
Hgb urine dipstick: NEGATIVE
Ketones, ur: 80 mg/dL — AB
Nitrite: NEGATIVE
Protein, ur: 100 mg/dL — AB
Specific Gravity, Urine: 1.027 (ref 1.005–1.030)
pH: 5 (ref 5.0–8.0)

## 2023-10-13 LAB — LIPASE, BLOOD: Lipase: 32 U/L (ref 11–51)

## 2023-10-13 MED ORDER — BISACODYL 10 MG RE SUPP: 10.0000 mg | Freq: Once | RECTAL | Status: DC

## 2023-10-13 MED ORDER — IOHEXOL 350 MG/ML SOLN
75.0000 mL | Freq: Once | INTRAVENOUS | Status: AC | PRN
  Administered 2023-10-13: 75 mL via INTRAVENOUS

## 2023-10-13 NOTE — ED Provider Notes (Signed)
 [place] [facility] AT [facility] Provider Note   CSN: [id number] Arrival date & time: 10/13/23  4098     History  Chief Complaint  Patient presents with   Abdominal Pain    Rebecca Barrera is a 63 y.o. female.  Patient complains of abdominal pain.  Patient reports that she was recently seen at [facility] and told that she had a blockage.  Patient reports that she has not had a bowel movement in several days.  Patient is concerned because she has had some intermittent nausea and vomiting.  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  Patient reports that she is cannot take laxatives because of her surgery.  Patient states she can use enemas and suppositories.  Patient reports that her abdominal pain is better than it was previously but she still is having discomfort.  Patient reports straining to try to have a bowel movement.  Patient reports having some reflux.  The history is provided by the patient. No language interpreter was used.  Abdominal Pain      Home Medications Prior to Admission medications   Medication Sig Start Date End Date Taking? Authorizing Provider  buPROPion  (WELLBUTRIN  XL) 150 MG 24 hr tablet Take 3 tablets (450 mg total) by mouth daily. 10/12/23 11/11/23 Yes Todd Fossa, MD  clonazePAM  (KLONOPIN ) 1 MG tablet May take 1 tablet (1 mg total) by mouth daily as needed for anxiety. May also take 0.5 tablets (0.5 mg total) at bedtime as needed for anxiety. 10/06/23 11/05/23 Yes Hisada, Reina, MD  ergocalciferol (VITAMIN D2) 1.25 MG (50000 UT) capsule Take 50,000 Units by mouth once a week.   Yes [provider]  famotidine  (PEPCID ) 40 MG tablet Take 1 tablet (40 mg total) by mouth daily. 10/03/23  Yes Fleming, Zelda W, NP  fluticasone  (FLONASE ) 50 MCG/ACT nasal spray Place 2 sprays into both nostrils daily. 08/11/23  Yes Nellie Banas M, [name]  linaclotide (LINZESS) 72 MCG capsule Take 72 mcg  by mouth daily before breakfast.   Yes [provider]  Multiple Vitamins-Minerals (CENTRUM SILVER 50+WOMEN PO) Take by mouth daily.   Yes [provider]  pantoprazole  (PROTONIX ) 40 MG tablet Take 40 mg by mouth daily. 10/02/23  Yes [provider]  pravastatin (PRAVACHOL) 20 MG tablet Take 20 mg by mouth daily.   Yes [provider]  rizatriptan (MAXALT) 10 MG tablet Take 10 mg by mouth as needed for migraine. May repeat in 2 hours if needed   Yes [provider]  sucralfate  (CARAFATE ) 1 GM/10ML suspension Take 10 mLs (1 g total) by mouth 4 (four) times daily -  with meals and at bedtime for 10 days. 10/02/23 10/13/23 Yes Blair, Diane W, FNP  venlafaxine  XR (EFFEXOR -XR) 75 MG 24 hr capsule Take 1 capsule (75 mg total) by mouth daily with breakfast. 06/14/23 12/11/23 Yes Hisada, Ivan Marion, MD  Vonoprazan Fumarate  10 MG TABS Take 10 mg by mouth daily. Patient not taking: Reported on 10/13/2023 10/03/23   Dorenda Gandy, MD      Allergies    Aspirin, Hydrocodone -acetaminophen , Lorazepam, and Vicodin [hydrocodone -acetaminophen ]    Review of Systems   Review of Systems  Gastrointestinal:  Positive for abdominal pain.  All other systems reviewed and are negative.   Physical Exam Updated Vital Signs BP (!) 141/80 (BP Location: Right Arm)   Pulse 89   Temp 98.2 F (36.8 C) (Oral)   Resp  20   SpO2 97%  Physical Exam Vitals and nursing note reviewed.  Constitutional:      Appearance: She is well-developed.  HENT:     Head: Normocephalic.  Cardiovascular:     Rate and Rhythm: Normal rate.  Pulmonary:     Effort: Pulmonary effort is normal.  Abdominal:     General: Abdomen is flat. Bowel sounds are normal. There is no distension.     Palpations: Abdomen is soft.     Tenderness: There is generalized abdominal tenderness.  Musculoskeletal:        General: Normal range of motion.     Cervical back: Normal range of motion.  Skin:    General: Skin is  warm.  Neurological:     Mental Status: She is alert and oriented to person, place, and time.     ED Results / Procedures / Treatments   Labs (all labs ordered are listed, but only abnormal results are displayed) Labs Reviewed  COMPREHENSIVE METABOLIC PANEL WITH GFR - Abnormal; Notable for the following components:      Result Value   Glucose, Bld 115 (*)    Total Protein 6.4 (*)    All other components within normal limits  URINALYSIS, ROUTINE W REFLEX MICROSCOPIC - Abnormal; Notable for the following components:   Color, Urine AMBER (*)    APPearance HAZY (*)    Ketones, ur 80 (*)    Protein, ur 100 (*)    Leukocytes,Ua TRACE (*)    Bacteria, UA RARE (*)    All other components within normal limits  LIPASE, BLOOD  CBC    EKG EKG Interpretation Date/Time:  Rebecca Oct 13 2023 08:54:06 EDT Ventricular Rate:  97 PR Interval:  118 QRS Duration:  86 QT Interval:  340 QTC Calculation: 431 R Axis:   62  Text Interpretation: T wave abnormality Normal sinus rhythm Confirmed by Dorenda Gandy [phone]) on 10/13/2023 2:09:25 PM  Radiology CT ABDOMEN PELVIS W CONTRAST Result Date: 10/13/2023 CLINICAL DATA:  Acute generalized abdominal pain. EXAM: CT ABDOMEN AND PELVIS WITH CONTRAST TECHNIQUE: Multidetector CT imaging of the abdomen and pelvis was performed using the standard protocol following bolus administration of intravenous contrast. RADIATION DOSE REDUCTION: This exam was performed according to the departmental dose-optimization program which includes automated exposure control, adjustment of the mA and/or kV according to patient size and/or use of iterative reconstruction technique. CONTRAST:  75mL OMNIPAQUE IOHEXOL 350 MG/ML SOLN COMPARISON:  Oct 11, 2023. FINDINGS: Lower chest: Minimal right pleural effusion is noted with adjacent subsegmental atelectasis. Large hiatal hernia is again noted which contains a large loop of transverse colon. Hepatobiliary: No focal liver  abnormality is seen. No gallstones, gallbladder wall thickening, or biliary dilatation. Pancreas: Unremarkable. No pancreatic ductal dilatation or surrounding inflammatory changes. Spleen: Normal in size without focal abnormality. Adrenals/Urinary Tract: Adrenal glands appear normal. Left renal cyst is noted for which no further follow-up is required. No hydronephrosis or renal obstruction. Urinary bladder is unremarkable. Stomach/Bowel: The appendix is unremarkable. As noted above, there is large hiatal hernia containing a large amount of mesenteric fat and a large portion of the transverse colon, but does not appear to contain a large portion of the stomach. There is dilatation of the more proximal transverse colon suggesting at least partial obstruction. Vascular/Lymphatic: No significant vascular findings are present. No enlarged abdominal or pelvic lymph nodes. Reproductive: Uterus and bilateral adnexa are unremarkable. Other: No ascites is noted. Musculoskeletal: No acute or significant osseous findings. IMPRESSION: As  noted on prior CT exam performed 2 days ago, large hiatal hernia is noted which contains a large portion of mesenteric fat and a large portion of the transverse colon. There is dilatation of more proximal transverse colon suggesting at least partial obstruction. Minimal right pleural effusion is noted with adjacent subsegmental atelectasis. Electronically Signed   By: Rosalene Colon M.D.   On: 10/13/2023 12:26   CT ABDOMEN PELVIS W CONTRAST Result Date: 10/11/2023 CLINICAL DATA:  Generalized abdominal pain and nausea. EXAM: CT ABDOMEN AND PELVIS WITH CONTRAST TECHNIQUE: Multidetector CT imaging of the abdomen and pelvis was performed using the standard protocol following bolus administration of intravenous contrast. RADIATION DOSE REDUCTION: This exam was performed according to the departmental dose-optimization program which includes automated exposure control, adjustment of the mA and/or  kV according to patient size and/or use of iterative reconstruction technique. CONTRAST:  100mL OMNIPAQUE IOHEXOL 300 MG/ML  SOLN COMPARISON:  None Available. FINDINGS: Lower chest: Mild atelectatic changes are seen within the posteromedial aspect of the right lung base. Hepatobiliary: No focal liver abnormality is seen. No gallstones, gallbladder wall thickening, or biliary dilatation. Pancreas: Unremarkable. No pancreatic ductal dilatation or surrounding inflammatory changes. Spleen: Normal in size without focal abnormality. Adrenals/Urinary Tract: Adrenal glands are unremarkable. Kidneys are normal in size, without renal calculi or hydronephrosis. A 14 mm diameter simple cyst is seen within the left kidney. Bladder is unremarkable. Stomach/Bowel: A large hiatal hernia is seen which contains mesenteric fat and the entire length of a redundant transverse colon. Stomach is within normal limits. Appendix appears normal. Prominent stool and air-filled cecum and ascending colon is seen. An abrupt transition zone is present as the ascending colon enters the previously noted large hiatal hernia (axial CT images 16 through 24, CT series 2/coronal reformatted image 33 through 41, CT series 3). Vascular/Lymphatic: No significant vascular findings are present. No enlarged abdominal or pelvic lymph nodes. Reproductive: Uterus and bilateral adnexa are unremarkable. Other: No abdominal wall hernia or abnormality. There is a very small amount of pelvic free fluid. Musculoskeletal: No acute or significant osseous findings. IMPRESSION: 1. Large hiatal hernia which contains mesenteric fat and the entire length of a redundant transverse colon. 2. Prominent stool and air-filled cecum and ascending colon with an abrupt transition zone as the ascending colon enters the previously noted large hiatal hernia. While this may be transient in nature, an early or partial colonic obstruction cannot be excluded. 3. Very small amount of pelvic  free fluid. Electronically Signed   By: Virgle Grime M.D.   On: 10/11/2023 23:38    Procedures Procedures    Medications Ordered in ED Medications  bisacodyl (DULCOLAX) suppository 10 mg (has no administration in time range)  iohexol (OMNIPAQUE) 350 MG/ML injection 75 mL (75 mLs Intravenous Contrast Given 10/13/23 1110)    ED Course/ Medical Decision Making/ A&P                                 Medical Decision Making Patient complains of abdominal pain nausea vomiting and constipation.  Amount and/or Complexity of Data Reviewed External Data Reviewed: notes.    Details: Patient was hospitalized at [facility] on 5/11.  Patient was supposed to be seen by the general surgeon for evaluation but she left AMA. Labs: ordered. Decision-making details documented in ED Course.    Details: Labs ordered reviewed and interpreted.  Glucose is 115 Radiology: ordered and independent interpretation  performed. Decision-making details documented in ED Course.    Details: CT scan of abdomen shows large hiatal hernia.  Prominent stool and air-filled to cecum and ascending colon with abrupt transition zone.  Radiologist reports that this could be a partial bowel obstruction. ECG/medicine tests: ordered and independent interpretation performed. Decision-making details documented in ED Course.    Details: EG shows normal sinus normal Discussion of management or test interpretation with external provider(s): Dr. Delane Fear and to see and examine the patient.  He reports patient does not need surgical intervention he advised follow-up with her GI doctor and primary care provider.  Risk Prescription drug management.  EKG         Final Clinical Impression(s) / ED Diagnoses Final diagnoses:  Generalized abdominal pain  Hiatal hernia    Rx / DC Orders ED Discharge Orders     None         Evelyn Hire 10/13/23 1527    Dorenda Gandy, MD 10/13/23 1534

## 2023-10-13 NOTE — Discharge Instructions (Addendum)
 EAT 6-8 SMALL MEALS DAILY RATHER THAN 3 LARGE MEALS. EAT A MOSTLY LIQUID DIET, FOCUSING ON PROTEIN (example: ensure, boost, premier protein)  IF YOU EAT SOLID FOOD, EAT SOFT FOODS AND CHEW THEM VERY WELL.  AVOID CARBONATED BEVERAGES - DRINK WATER AND DRINK LOW IN SUGAR.

## 2023-10-13 NOTE — ED Triage Notes (Signed)
 Pt here with reports of GERD and abdominal pain onset last night.

## 2023-10-13 NOTE — Consult Note (Signed)
 Rebecca Barrera Feb 25, 1961  [id number].    Requesting MD: Liam Redhead, MD Chief Complaint/Reason for Consult: recurrent paraesophageal hiatal hernia   HPI:  Rebecca Barrera is a 63 y/o F with PMH anxiety, HLD, and hiatal hernia who presents with abdominal pain, nausea, vomiting. 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  Today she tells us  that on 09/27/23 she ate two cheeseburgers from [employer] for dinner and the next day developed acid reflux. Since that time she reports worsening episodes of cramping abdominal discomfort and intolerance of solid food. She currently tolerates liquids and applesauce without vomiting. She reports flatus and says it has been multiple days since her last BM. She saw Dr. Mason Sole (PCP) for these complaints and says she has an appointment with GI in June. She denies tobacco, alcohol, or drug use. Denies use of blood thinners.  ROS: Review of Systems  All other systems reviewed and are negative.   Family History  Problem Relation Age of Onset   Hyperlipidemia Mother    Hypertension Mother    Stroke Mother    Dementia Mother    Migraines Mother    Anxiety disorder Mother    Depression Mother    Heart disease Father    Heart disease Sister    Alcohol abuse Sister    Seizures Brother    Epilepsy Brother    Heart disease Brother    Hyperlipidemia Son    Hypertension Son    Colon cancer Neg Hx     Past Medical History:  Diagnosis Date   Anemia    Anxiety    Depression    Hiatal hernia    High cholesterol    Leg pain    Migraine    Migraines    Panic attacks    Varicose veins     Past Surgical History:  Procedure Laterality Date   COLONOSCOPY  2008   Dr. Homero Luster   ESOPHAGOGASTRODUODENOSCOPY  2008   Dr. Rehman--> superficial ulceration at the GE junction a large hiatal  hernia with dependent segment on the left side with food debris and coffee-ground material, swollen and erythematous folds, few antral erosions.   ESOPHAGOGASTRODUODENOSCOPY  02/08/2008   Normal esophageal mucosa aside from Schatzki's ring not manipulated (the patient not dysphagic) Large diaphragmatic and likely paraesophageal hernia, gastric      mucosa appeared normal, otherwise pylorus patent, normal D1 and D2.   HERNIA REPAIR     HIATAL HERNIA REPAIR  06/2008   paraesophageal hernia repair   TUBAL LIGATION      Social History:  reports that she quit smoking about 11 years ago. Her smoking use included cigarettes. She started smoking about 16 years ago. She has never used smokeless tobacco. She reports that she does not drink alcohol and does not use drugs.  Allergies:  Allergies  Allergen Reactions   Aspirin Other (See Comments)    Due to hernia   Hydrocodone -Acetaminophen  Itching   Lorazepam Other (See Comments)    migranes   Vicodin [Hydrocodone -Acetaminophen ]     Pt states it gives her a headache    (Not in a hospital admission)    Physical Exam: Blood pressure (!) 141/80, pulse 89, temperature 98.2 F (36.8 C), temperature source Oral, resp. rate 20, SpO2 97%. General: white female, laying on hospital bed, appears stated age, NAD.  HEENT: head -normocephalic, atraumatic; Eyes: PERRLA, no conjunctival injection;anicteric sclerae Neck- Trachea is midline CV- RRR, normal S1/S2, no M/R/G, no lower extremity edema Pulm- breathing is non-labored ORA  Abd- soft, non-tender, non-distended, previous laparoscopic port sites and G tube site appear well-healing. No palpable hernias or masses.  GU- deferred  MSK- UE/LE symmetrical, no cyanosis, clubbing, or edema. Neuro- CN II-XII grossly in tact, no paresthesias. Psych- Alert and Oriented x3 with appropriate affect Skin: warm and dry, no rashes or lesions   Results for orders placed or performed during the hospital encounter of  10/13/23 (from the past 48 hours)  Urinalysis, Routine w reflex microscopic -Urine, Clean Catch     Status: Abnormal   Collection Time: 10/13/23  9:28 AM  Result Value Ref Range   Color, Urine AMBER (A) YELLOW    Comment: BIOCHEMICALS MAY BE AFFECTED BY COLOR   APPearance HAZY (A) CLEAR   Specific Gravity, Urine 1.027 1.005 - 1.030   pH 5.0 5.0 - 8.0   Glucose, UA NEGATIVE NEGATIVE mg/dL   Hgb urine dipstick NEGATIVE NEGATIVE   Bilirubin Urine NEGATIVE NEGATIVE   Ketones, ur 80 (A) NEGATIVE mg/dL   Protein, ur 161 (A) NEGATIVE mg/dL   Nitrite NEGATIVE NEGATIVE   Leukocytes,Ua TRACE (A) NEGATIVE   RBC / HPF 0-5 0 - 5 RBC/hpf   WBC, UA 0-5 0 - 5 WBC/hpf   Bacteria, UA RARE (A) NONE SEEN   Squamous Epithelial / HPF 0-5 0 - 5 /HPF   Mucus PRESENT     Comment: Performed at [facility], [address], [place], [place] [address]  Lipase, blood     Status: None   Collection Time: 10/13/23  9:29 AM  Result Value Ref Range   Lipase 32 11 - 51 U/L    Comment: Performed at [facility], [address] [place], [place] [address]  Comprehensive metabolic panel     Status: Abnormal   Collection Time: 10/13/23  9:29 AM  Result Value Ref Range   Sodium 143 135 - 145 mmol/L   Potassium 3.6 3.5 - 5.1 mmol/L   Chloride 106 98 - 111 mmol/L   CO2 28 22 - 32 mmol/L   Glucose, Bld 115 (H) 70 - 99 mg/dL    Comment: Glucose reference range applies only to samples taken after fasting for at least 8 hours.   BUN 9 6 - 20 mg/dL   Creatinine, Ser 1.19 0.44 - 1.00 mg/dL   Calcium 9.2 8.9 - 14.7 mg/dL   Total Protein 6.4 (L) 6.5 - 8.1 g/dL   Albumin 3.6 3.5 - 5.0 g/dL   AST 16 15 - 41 U/L   ALT 19 0 - 44 U/L   Alkaline Phosphatase 48 38 - 126 U/L   Total Bilirubin 0.6 0.0 - 1.2 mg/dL   GFR, Estimated >82 >95 mL/min    Comment: (NOTE) Calculated using the CKD-EPI Creatinine Equation (2021)    Anion gap 9 5 - 15    Comment: Performed at [facility], [address]  [place], [place] [address]  CBC     Status: None   Collection Time: 10/13/23  9:29 AM  Result Value Ref Range   WBC 7.7 4.0 - 10.5 K/uL   RBC 4.55 3.87 - 5.11 MIL/uL   Hemoglobin 14.1 12.0 - 15.0 g/dL   HCT 86.5 78.4 - 69.6 %   MCV 93.4 80.0 - 100.0 fL   MCH 31.0 26.0 -  34.0 pg   MCHC 33.2 30.0 - 36.0 g/dL   RDW 16.1 09.6 - 04.5 %   Platelets 214 150 - 400 K/uL   nRBC 0.0 0.0 - 0.2 %    Comment: Performed at [facility], [address], [place], [place] [address]   CT ABDOMEN PELVIS W CONTRAST Result Date: 10/13/2023 CLINICAL DATA:  Acute generalized abdominal pain. EXAM: CT ABDOMEN AND PELVIS WITH CONTRAST TECHNIQUE: Multidetector CT imaging of the abdomen and pelvis was performed using the standard protocol following bolus administration of intravenous contrast. RADIATION DOSE REDUCTION: This exam was performed according to the departmental dose-optimization program which includes automated exposure control, adjustment of the mA and/or kV according to patient size and/or use of iterative reconstruction technique. CONTRAST:  75mL OMNIPAQUE IOHEXOL 350 MG/ML SOLN COMPARISON:  Oct 11, 2023. FINDINGS: Lower chest: Minimal right pleural effusion is noted with adjacent subsegmental atelectasis. Large hiatal hernia is again noted which contains a large loop of transverse colon. Hepatobiliary: No focal liver abnormality is seen. No gallstones, gallbladder wall thickening, or biliary dilatation. Pancreas: Unremarkable. No pancreatic ductal dilatation or surrounding inflammatory changes. Spleen: Normal in size without focal abnormality. Adrenals/Urinary Tract: Adrenal glands appear normal. Left renal cyst is noted for which no further follow-up is required. No hydronephrosis or renal obstruction. Urinary bladder is unremarkable. Stomach/Bowel: The appendix is unremarkable. As noted above, there is large hiatal hernia containing a large amount of mesenteric fat and a large portion of the transverse colon,  but does not appear to contain a large portion of the stomach. There is dilatation of the more proximal transverse colon suggesting at least partial obstruction. Vascular/Lymphatic: No significant vascular findings are present. No enlarged abdominal or pelvic lymph nodes. Reproductive: Uterus and bilateral adnexa are unremarkable. Other: No ascites is noted. Musculoskeletal: No acute or significant osseous findings. IMPRESSION: As noted on prior CT exam performed 2 days ago, large hiatal hernia is noted which contains a large portion of mesenteric fat and a large portion of the transverse colon. There is dilatation of more proximal transverse colon suggesting at least partial obstruction. Minimal right pleural effusion is noted with adjacent subsegmental atelectasis. Electronically Signed   By: Rosalene Colon M.D.   On: 10/13/2023 12:26   CT ABDOMEN PELVIS W CONTRAST Result Date: 10/11/2023 CLINICAL DATA:  Generalized abdominal pain and nausea. EXAM: CT ABDOMEN AND PELVIS WITH CONTRAST TECHNIQUE: Multidetector CT imaging of the abdomen and pelvis was performed using the standard protocol following bolus administration of intravenous contrast. RADIATION DOSE REDUCTION: This exam was performed according to the departmental dose-optimization program which includes automated exposure control, adjustment of the mA and/or kV according to patient size and/or use of iterative reconstruction technique. CONTRAST:  100mL OMNIPAQUE IOHEXOL 300 MG/ML  SOLN COMPARISON:  None Available. FINDINGS: Lower chest: Mild atelectatic changes are seen within the posteromedial aspect of the right lung base. Hepatobiliary: No focal liver abnormality is seen. No gallstones, gallbladder wall thickening, or biliary dilatation. Pancreas: Unremarkable. No pancreatic ductal dilatation or surrounding inflammatory changes. Spleen: Normal in size without focal abnormality. Adrenals/Urinary Tract: Adrenal glands are unremarkable. Kidneys are  normal in size, without renal calculi or hydronephrosis. A 14 mm diameter simple cyst is seen within the left kidney. Bladder is unremarkable. Stomach/Bowel: A large hiatal hernia is seen which contains mesenteric fat and the entire length of a redundant transverse colon. Stomach is within normal limits. Appendix appears normal. Prominent stool and air-filled cecum and ascending colon is seen. An  abrupt transition zone is present as the ascending colon enters the previously noted large hiatal hernia (axial CT images 16 through 24, CT series 2/coronal reformatted image 33 through 41, CT series 3). Vascular/Lymphatic: No significant vascular findings are present. No enlarged abdominal or pelvic lymph nodes. Reproductive: Uterus and bilateral adnexa are unremarkable. Other: No abdominal wall hernia or abnormality. There is a very small amount of pelvic free fluid. Musculoskeletal: No acute or significant osseous findings. IMPRESSION: 1. Large hiatal hernia which contains mesenteric fat and the entire length of a redundant transverse colon. 2. Prominent stool and air-filled cecum and ascending colon with an abrupt transition zone as the ascending colon enters the previously noted large hiatal hernia. While this may be transient in nature, an early or partial colonic obstruction cannot be excluded. 3. Very small amount of pelvic free fluid. Electronically Signed   By: Virgle Grime M.D.   On: 10/11/2023 23:38      Assessment/Plan Recurrent hiatal hernia, containing transverse colon, without evidence of instruction 63 y/o F with history of hiatal hernia repair x2 and previous gastropexy/gastrostomy tube who presents with abdominal pain and dysphagia to solids. I reviewed her labs, imaging, and examined the patient. Her imaging shows a recurrent hiatal hernia containing mesenteric fat and transverse colon. She has no clincal evidence of obstriction from this. Her CBC and BMP are unremarkable. I do not see any  emergent role for surgery. This is her second recurrence and she will need to follow up with a general surgeon, and possibly a cardiothoracic surgeon, for surgical planning and another hiatal hernia repair. Given the recurrence and complexity of this, I do think she should follow up at an academic center like [facility]. She also needs to see GI for an upper endoscopy as a part of surgical planning.   Would recommend she continue a full liquid diet with protein supplements. If she eats solids it should be soft food that she chews well. She would do better with small frequent meals rather than 3 large meals daily. Recommend miralax BID PRN for constipation.    High MDM    I reviewed nursing notes, ED provider notes, last 24 h vitals and pain scores, last 48 h intake and output, last 24 h labs and trends, and last 24 h imaging results.  Charlott Converse, [facility] 10/13/2023, 2:49 PM Please see Amion for pager number during day hours 7:00am-4:30pm or 7:00am -11:30am on weekends

## 2023-10-14 ENCOUNTER — Telehealth

## 2023-10-14 ENCOUNTER — Telehealth: Admitting: Family Medicine

## 2023-10-14 ENCOUNTER — Encounter: Payer: Self-pay | Admitting: Family Medicine

## 2023-10-14 DIAGNOSIS — R109 Unspecified abdominal pain: Secondary | ICD-10-CM

## 2023-10-14 DIAGNOSIS — K449 Diaphragmatic hernia without obstruction or gangrene: Secondary | ICD-10-CM

## 2023-10-14 NOTE — Progress Notes (Signed)
    Needs to reach out to GI/PCP to get follow up based on findings from the ED.  Patient acknowledged agreement and understanding of the plan.

## 2023-10-17 ENCOUNTER — Encounter: Payer: Self-pay | Admitting: Physician Assistant

## 2023-10-17 ENCOUNTER — Encounter: Payer: Self-pay | Admitting: Family Medicine
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
## 2023-10-28 ENCOUNTER — Encounter: Payer: Self-pay | Admitting: Psychiatry

## 2023-10-28 ENCOUNTER — Telehealth: Admitting: Psychiatry

## 2023-10-28 DIAGNOSIS — F4381 Prolonged grief disorder: Secondary | ICD-10-CM | POA: Diagnosis not present

## 2023-10-28 DIAGNOSIS — F3341 Major depressive disorder, recurrent, in partial remission: Secondary | ICD-10-CM | POA: Diagnosis not present

## 2023-10-28 DIAGNOSIS — F419 Anxiety disorder, unspecified: Secondary | ICD-10-CM | POA: Diagnosis not present

## 2023-10-28 MED ORDER — BUPROPION HCL ER (XL) 150 MG PO TB24: 450.0000 mg | ORAL_TABLET | Freq: Every day | ORAL | 5 refills | Status: DC

## 2023-10-28 MED ORDER — CLONAZEPAM 1 MG PO TABS: ORAL_TABLET | ORAL | 1 refills | Status: DC

## 2023-11-02 ENCOUNTER — Ambulatory Visit (INDEPENDENT_AMBULATORY_CARE_PROVIDER_SITE_OTHER): Admitting: Gastroenterology

## 2023-11-02 ENCOUNTER — Other Ambulatory Visit: Payer: Self-pay | Admitting: Gastroenterology

## 2023-11-02 DIAGNOSIS — K449 Diaphragmatic hernia without obstruction or gangrene: Secondary | ICD-10-CM

## 2023-11-12 ENCOUNTER — Other Ambulatory Visit
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
## 2023-12-09 ENCOUNTER — Telehealth (INDEPENDENT_AMBULATORY_CARE_PROVIDER_SITE_OTHER): Admitting: Psychiatry

## 2023-12-09 ENCOUNTER — Encounter: Payer: Self-pay | Admitting: Psychiatry

## 2023-12-09 DIAGNOSIS — F4381 Prolonged grief disorder: Secondary | ICD-10-CM

## 2023-12-09 DIAGNOSIS — F330 Major depressive disorder, recurrent, mild: Secondary | ICD-10-CM

## 2023-12-09 DIAGNOSIS — F419 Anxiety disorder, unspecified: Secondary | ICD-10-CM

## 2023-12-09 MED ORDER — CLONAZEPAM 0.5 MG PO TABS: 0.5000 mg | ORAL_TABLET | Freq: Two times a day (BID) | ORAL | 1 refills | Status: DC | PRN

## 2023-12-09 MED ORDER — VENLAFAXINE HCL ER 75 MG PO CP24: 75.0000 mg | ORAL_CAPSULE | Freq: Every day | ORAL | 5 refills | Status: AC

## 2023-12-09 NOTE — Patient Instructions (Addendum)
 Continue venlafaxine  75 mg daily  Continue bupropion  450 mg daily  Decrease clonazepam  0.5 mg twice a day for anxiety Next appointment: 8/20 at 3 30

## 2023-12-14 ENCOUNTER — Ambulatory Visit (HOSPITAL_COMMUNITY)
Admission: RE | Admit: 2023-12-14 | Discharge: 2023-12-14 | Disposition: A | Discharge status: Home / Self Care | Source: Ambulatory Visit | Attending: Family Medicine | Admitting: Family Medicine

## 2023-12-14 ENCOUNTER — Encounter (HOSPITAL_COMMUNITY): Payer: Self-pay

## 2023-12-14 DIAGNOSIS — Z1231 Encounter for screening mammogram for malignant neoplasm of breast: Secondary | ICD-10-CM | POA: Diagnosis present

## 2023-12-22 ENCOUNTER — Other Ambulatory Visit: Payer: Self-pay | Admitting: Psychiatry

## 2023-12-31 ENCOUNTER — Other Ambulatory Visit: Payer: Self-pay

## 2023-12-31 ENCOUNTER — Emergency Department (HOSPITAL_COMMUNITY)

## 2023-12-31 ENCOUNTER — Encounter (HOSPITAL_COMMUNITY): Payer: Self-pay | Admitting: *Deleted

## 2023-12-31 ENCOUNTER — Emergency Department (HOSPITAL_COMMUNITY)
Admission: EM | Admit: 2023-12-31 | Discharge: 2023-12-31 | Disposition: A | Discharge status: Other Acute Inpatient Hospital | Attending: Emergency Medicine | Admitting: Emergency Medicine

## 2023-12-31 DIAGNOSIS — R112 Nausea with vomiting, unspecified: Secondary | ICD-10-CM

## 2023-12-31 DIAGNOSIS — R109 Unspecified abdominal pain: Secondary | ICD-10-CM | POA: Diagnosis present

## 2023-12-31 DIAGNOSIS — K440 Diaphragmatic hernia with obstruction, without gangrene: Secondary | ICD-10-CM | POA: Insufficient documentation

## 2023-12-31 DIAGNOSIS — R1013 Epigastric pain: Secondary | ICD-10-CM

## 2023-12-31 HISTORY — DX: Gastro-esophageal reflux disease without esophagitis: K21.9

## 2023-12-31 LAB — COMPREHENSIVE METABOLIC PANEL WITH GFR
ALT: 28 U/L (ref 0–44)
AST: 31 U/L (ref 15–41)
Albumin: 4 g/dL (ref 3.5–5.0)
Alkaline Phosphatase: 70 U/L (ref 38–126)
Anion gap: 12 (ref 5–15)
BUN: 10 mg/dL (ref 8–23)
CO2: 23 mmol/L (ref 22–32)
Calcium: 9.5 mg/dL (ref 8.9–10.3)
Chloride: 106 mmol/L (ref 98–111)
Creatinine, Ser: 0.98 mg/dL (ref 0.44–1.00)
GFR, Estimated: >60 mL/min (ref >60)
Glucose, Bld: 131 mg/dL — ABNORMAL HIGH (ref 70–99)
Potassium: 4 mmol/L (ref 3.5–5.1)
Sodium: 141 mmol/L (ref 135–145)
Total Bilirubin: 0.9 mg/dL (ref 0.0–1.2)
Total Protein: 6.8 g/dL (ref 6.5–8.1)

## 2023-12-31 LAB — URINALYSIS, ROUTINE W REFLEX MICROSCOPIC
Bilirubin Urine: NEGATIVE
Glucose, UA: NEGATIVE mg/dL
Hgb urine dipstick: NEGATIVE
Ketones, ur: 20 mg/dL — AB
Leukocytes,Ua: NEGATIVE
Nitrite: NEGATIVE
Protein, ur: NEGATIVE mg/dL
Specific Gravity, Urine: 1.041 — ABNORMAL HIGH (ref 1.005–1.030)
pH: 6 (ref 5.0–8.0)

## 2023-12-31 LAB — CBC
HCT: 46.2 % — ABNORMAL HIGH (ref 36.0–46.0)
Hemoglobin: 15.2 g/dL — ABNORMAL HIGH (ref 12.0–15.0)
MCH: 31.1 pg (ref 26.0–34.0)
MCHC: 32.9 g/dL (ref 30.0–36.0)
MCV: 94.5 fL (ref 80.0–100.0)
Platelets: 191 K/uL (ref 150–400)
RBC: 4.89 MIL/uL (ref 3.87–5.11)
RDW: 12.9 % (ref 11.5–15.5)
WBC: 7.3 K/uL (ref 4.0–10.5)
nRBC: 0 % (ref 0.0–0.2)

## 2023-12-31 LAB — TROPONIN I (HIGH SENSITIVITY)
Troponin I (High Sensitivity): 3 ng/L (ref <18)
Troponin I (High Sensitivity): 3 ng/L (ref <18)

## 2023-12-31 LAB — LIPASE, BLOOD: Lipase: 26 U/L (ref 11–51)

## 2023-12-31 MED ORDER — LORAZEPAM 2 MG/ML IJ SOLN
1.0000 mg | Freq: Once | INTRAMUSCULAR | Status: AC
  Administered 2023-12-31: 1 mg via INTRAVENOUS
  Filled 2023-12-31: qty 1

## 2023-12-31 MED ORDER — SODIUM CHLORIDE 0.9 % IV SOLN
12.5000 mg | Freq: Once | INTRAVENOUS | Status: AC
  Administered 2023-12-31: 12.5 mg via INTRAVENOUS
  Filled 2023-12-31: qty 12.5

## 2023-12-31 MED ORDER — FENTANYL CITRATE PF 50 MCG/ML IJ SOSY
25.0000 ug | PREFILLED_SYRINGE | Freq: Once | INTRAMUSCULAR | Status: AC
  Administered 2023-12-31: 25 ug via INTRAVENOUS
  Filled 2023-12-31: qty 1

## 2023-12-31 MED ORDER — LORAZEPAM 2 MG/ML IJ SOLN
0.5000 mg | Freq: Once | INTRAMUSCULAR | Status: AC
  Administered 2023-12-31: 0.5 mg via INTRAVENOUS
  Filled 2023-12-31: qty 1

## 2023-12-31 MED ORDER — ONDANSETRON HCL 4 MG/2ML IJ SOLN
4.0000 mg | Freq: Once | INTRAMUSCULAR | Status: AC
  Administered 2023-12-31: 4 mg via INTRAVENOUS
  Filled 2023-12-31: qty 2

## 2023-12-31 MED ORDER — SODIUM CHLORIDE 0.9 % IV BOLUS
1000.0000 mL | Freq: Once | INTRAVENOUS | Status: AC
  Administered 2023-12-31: 1000 mL via INTRAVENOUS

## 2023-12-31 MED ORDER — PANTOPRAZOLE SODIUM 40 MG IV SOLR
40.0000 mg | Freq: Once | INTRAVENOUS | Status: AC
  Administered 2023-12-31: 40 mg via INTRAVENOUS
  Filled 2023-12-31: qty 10

## 2023-12-31 MED ORDER — IOHEXOL 350 MG/ML SOLN
75.0000 mL | Freq: Once | INTRAVENOUS | Status: AC | PRN
  Administered 2023-12-31: 75 mL via INTRAVENOUS

## 2023-12-31 MED ORDER — SODIUM CHLORIDE 0.9 % IV SOLN: Freq: Once | INTRAVENOUS | Status: AC

## 2023-12-31 NOTE — ED Triage Notes (Signed)
 The pt is c/o abd pain with nausea  and she also has chest pain  she has acid reflux also earlier

## 2023-12-31 NOTE — H&P (Signed)
 ------------------------------------------------------------------------------- Attestation signed by Garnette Jayson Ginger, MD at 01/01/2024  3:18 PM I saw and evaluated the patient with the resident, and I agree with the plan detailed above.  Garnette GORMAN Ginger, MD  -------------------------------------------------------------------------------   History & Physical  HPI:   Rebecca Barrera is a 63 y.o. female s/p hiatal hernia repair in 2012 with Dr. Marlyse and redo hiatal hernia repair with mesh + G tube + gastropexy in 2014 with Dr. Perri. Cross-sectional imaging at that time showed large hiatal hernia containing a large portion of mesenteric fat and transverse colon.  Surgical evaluation was done at the time and recommended referral to a tertiary Medical Center. Since then she did not have any obstructive symptoms until 10/13/23 that she went to the ED with solid food intolerance and heartburn and epigastric pain. She has had an upper GI done on June 9 and an EGD with dilation on May 30.  She reports that after having both these interventions she has had near resolution of her symptoms.  On 12/17/23  UGI and CT images. CT shows colon in the hiatal hernia. EGD report shows normal esophagus but reports diffuse gastritis.   She reports a sudden onset discomfort in her mid thorax followed by nausea and vomiting beginning yesterday noon. Since then she was unable to swallow and she felt the food stuck in her retrosternal region.   PMH: Depression wit anxiety Panic attacks  PSH: Surgical History[1]  SOCIAL HISTORY: Social History   Socioeconomic History  . Marital status: Divorced    Spouse name: Not on file  . Number of children: Not on file  . Years of education: Not on file  . Highest education level: Not on file  Occupational History  . Not on file  Tobacco Use  . Smoking status: Never  . Smokeless tobacco: Not on file  Substance and Sexual Activity  . Alcohol use: No  . Drug use:  No  . Sexual activity: Not on file  Other Topics Concern  . Not on file  Social History Narrative  . Not on file   Social Drivers of Health   Food Insecurity: Not on file  Transportation Needs: Not on file  Safety: Low Risk  (11/12/2023)   Safety   . How often does anyone, including family and friends, physically hurt you?: Never   . How often does anyone, including family and friends, insult or talk down to you?: Never   . How often does anyone, including family and friends, threaten you with harm?: Never   . How often does anyone, including family and friends, scream or curse at you?: Never  Living Situation: Not on file    FAMILY HISTORY: Family History[2]  MEDICATIONS: Prior to Admission medications  Medication Sig Start Date End Date Taking? Authorizing Provider  buPROPion  (WELLBUTRIN  XL) 150 mg 24 hr tablet Take 450 mg by mouth every morning. 10/06/23  Yes HISTORICAL PROVIDER, CONVERSION  clonazePAM  (KlonoPIN ) 1 mg tablet Take 0.5 mg by mouth 2 (two) times a day. 12/09/23 02/07/24 Yes HISTORICAL PROVIDER, CONVERSION  ergocalciferol (VITAMIN D2) 1,250 mcg (50,000 unit) capsule Take 50,000 Units by mouth once a week.   Yes HISTORICAL PROVIDER, CONVERSION  famotidine  (PEPCID ) 40 mg tablet Take 40 mg by mouth daily.   Yes HISTORICAL PROVIDER, CONVERSION  pantoprazole  (PROTONIX ) 40 mg EC tablet Take 40 mg by mouth 2 (two) times a day. 11/03/23  Yes HISTORICAL PROVIDER, CONVERSION  pravastatin (PRAVACHOL) 20 mg tablet Take 20 mg by  mouth daily.   Yes HISTORICAL PROVIDER, CONVERSION  venlafaxine  (EFFEXOR  XR) 75 mg 24 hr capsule Take 1 capsule (75 mg total) by mouth daily with breakfast.   Yes HISTORICAL PROVIDER, CONVERSION    ALLERGIES: Allergies[3]  ROS: A complete review of systems was performed with pertinent positives and negatives included in the HPI.  Physical Exam: General: female, she is stressed,  Neuro:  ; no gross deficits HEENT: normocephalic, atraumatic, NGT in place   Cardiovascular: hemodynamically stable, tachycardic Chest/Lungs: Non-labored breathing, she feels the pill that she was given to swallow is stuck in her mid esophagus and she is nauseated Abdomen: soft non-distended, no tenderness, no rebound tenderness no guarding Extremities: moves extremities spontaneously Skin: warm and dry GU: No foley  CT Abdomen Pelvis W IV Only  12/31/23 IMPRESSION:  1. Large hiatal hernia with mass effect upon the gastroesophageal junction by the mid body of the stomach as it passes through the diaphragmatic hiatus and the crura of the diaphragm with at least some degree of obstruction of the esophagus which appears fluid-filled and mildly distended. This appears new since prior examination of 10/13/2023.  2. Previously noted herniated transverse colon has return to the abdominal compartment.  3. Otherwise no acute intra-abdominal pathology identified.   Assessment: 63 y.o. female with a history of hiatal hernia repair in 2012 and redo hiatal hernia repair with mesh + G tube + gastropexy in 2014. She is admitted due to sudden onset nausea and inability to swallow and discomfort in her retrosternal mid thorax . CT scan reveals paraesophageal hiatal hernia.   Plan: NGTube decompression, NPO and she will probably need hernia fixed the a semi elective manner          [1] Past Surgical History: Procedure Laterality Date  . GASTROSTOMY TUBE PLACEMENT  08/20/2012   Procedure: GASTROSTOMY PERCUTANEOUS ENDOSCOPIC (PEG);  Surgeon: Laurel Cheral Monte, MD;  Location[facility] MA[facility] OR;  Service: General;;  . PARAESOPHAGEAL HERNIA REPAIR N/A 08/20/2012   Procedure: PARAESOPHAGEAL HERNIA REPAIR LAPAROSCOPIC with mesh;  Surgeon: Laurel Cheral Monte, MD;  Location: [facility] MAIN OR;  Service: General;  Laterality: N/A;  Gastropexy  . TUBAL LIGATION     Procedure: TUBAL LIGATION  [2] No family history on file. [3] Allergies Allergen Reactions  . Aspirin Bleeding  .  Hydrocodone -Acetaminophen  Itching  . Lorazepam  Other (See Comments)    migranes

## 2023-12-31 NOTE — ED Notes (Signed)
 Called for pt pick up to [facility], waiting on a truck.

## 2023-12-31 NOTE — ED Notes (Signed)
 [facility] at bedside

## 2023-12-31 NOTE — ED Notes (Signed)
 [facility] has accepted pt , and the room number is C908 on the 9th phone number to get report is [phone]. The nurse tried to call and give report but [facility] would not accept because they said it was in the middle of shift change. Once report is given they will let NS know when to set up [facility] transport. I will not be doing so on my shift will pass this to the on coming NS

## 2023-12-31 NOTE — ED Notes (Signed)
 No suction per provider for NG tube.

## 2023-12-31 NOTE — ED Provider Notes (Addendum)
  EMERGENCY DEPARTMENT AT [facility] Provider Note   CSN: [id number] Arrival date & time: 12/31/23  0120     Patient presents with: Abdominal Pain   Rebecca Barrera is a 63 y.o. female with medical history include acid reflux, anemia, anxiety, depression, hiatal hernia, migraines, panic attacks.  Patient presents to the ED for evaluation of nausea, vomiting.  Reports that around 12 noon today, she was attempting to eat cheese when she began to have onset of nausea and vomiting.  Reports that she has vomited a lot of times since onset of symptoms.  Denies any blood in her vomit.  Reports that her symptoms feel like GERD.  States that she has had issues with GERD in the past.  States she tried to take Protonix  but she vomited it back up.  Reports burning sensation in her epigastric region.  Denies chest pain, shortness of breath, diarrhea, fevers, dysuria, abdominal pain.  Denies leg swelling, lightheadedness, dizziness or weakness.    Abdominal Pain Associated symptoms: nausea and vomiting        Prior to Admission medications   Medication Sig Start Date End Date Taking? Authorizing Provider  buPROPion  (WELLBUTRIN  XL) 150 MG 24 hr tablet Take 3 tablets (450 mg total) by mouth daily. 11/11/23 05/09/24  Vickey Mettle, MD  clonazePAM  (KLONOPIN ) 0.5 MG tablet Take 1 tablet (0.5 mg total) by mouth 2 (two) times daily as needed for anxiety. 12/09/23 02/07/24  Vickey Mettle, MD  clonazePAM  (KLONOPIN ) 1 MG tablet May take 1 tablet (1 mg total) by mouth daily as needed for anxiety. May also take 0.5 tablets (0.5 mg total) at bedtime as needed for anxiety. 11/05/23 01/04/24  Hisada, Reina, MD  ergocalciferol (VITAMIN D2) 1.25 MG (50000 UT) capsule Take 50,000 Units by mouth once a week.    [provider]  famotidine  (PEPCID ) 40 MG tablet Take 1 tablet (40 mg total) by mouth daily. 10/03/23   Fleming, Zelda W, NP  fluticasone  (FLONASE ) 50 MCG/ACT nasal spray Place 2 sprays into both  nostrils daily. 08/11/23   Vivienne Delon HERO, [name]  linaclotide (LINZESS) 72 MCG capsule Take 72 mcg by mouth daily before breakfast.    [provider]  Multiple Vitamins-Minerals (CENTRUM SILVER 50+WOMEN PO) Take by mouth daily.    [provider]  pantoprazole  (PROTONIX ) 40 MG tablet Take 40 mg by mouth daily. 10/02/23   [provider]  pravastatin (PRAVACHOL) 20 MG tablet Take 20 mg by mouth daily.    [provider]  rizatriptan (MAXALT) 10 MG tablet Take 10 mg by mouth as needed for migraine. May repeat in 2 hours if needed    [provider]  sucralfate  (CARAFATE ) 1 GM/10ML suspension Take 10 mLs (1 g total) by mouth 4 (four) times daily -  with meals and at bedtime for 10 days. 10/02/23 10/13/23  Blair, Diane W, FNP  venlafaxine  XR (EFFEXOR -XR) 75 MG 24 hr capsule Take 1 capsule (75 mg total) by mouth daily with breakfast. 12/09/23 06/06/24  Vickey Mettle, MD  Vonoprazan Fumarate  10 MG TABS Take 10 mg by mouth daily. Patient not taking: Reported on 10/13/2023 10/03/23   Garrick Charleston, MD    Allergies: Aspirin, Hydrocodone -acetaminophen , Lorazepam , and Vicodin [hydrocodone -acetaminophen ]    Review of Systems  Gastrointestinal:  Positive for abdominal pain, nausea and vomiting.  All other systems reviewed and are negative.   Updated Vital Signs BP (!) 137/90   Pulse (!) 110   Temp 99.3 F (37.4  C) (Oral)   Resp 18   Ht 5' (1.524 m)   Wt 76 kg   SpO2 99%   BMI 32.72 kg/m   Physical Exam Vitals and nursing note reviewed.  Constitutional:      General: She is not in acute distress.    Appearance: She is well-developed.  HENT:     Head: Normocephalic and atraumatic.  Eyes:     Conjunctiva/sclera: Conjunctivae normal.  Cardiovascular:     Rate and Rhythm: Normal rate and regular rhythm.     Heart sounds: No murmur heard. Pulmonary:     Effort: Pulmonary effort is normal. No respiratory distress.     Breath sounds: Normal breath  sounds.  Abdominal:     Palpations: Abdomen is soft.     Tenderness: There is abdominal tenderness.  Musculoskeletal:        General: No swelling.     Cervical back: Neck supple.     Right lower leg: No edema.     Left lower leg: No edema.  Skin:    General: Skin is warm and dry.     Capillary Refill: Capillary refill takes less than 2 seconds.  Neurological:     Mental Status: She is alert and oriented to person, place, and time. Mental status is at baseline.  Psychiatric:        Mood and Affect: Mood normal.     (all labs ordered are listed, but only abnormal results are displayed) Labs Reviewed  COMPREHENSIVE METABOLIC PANEL WITH GFR - Abnormal; Notable for the following components:      Result Value   Glucose, Bld 131 (*)    All other components within normal limits  CBC - Abnormal; Notable for the following components:   Hemoglobin 15.2 (*)    HCT 46.2 (*)    All other components within normal limits  URINALYSIS, ROUTINE W REFLEX MICROSCOPIC - Abnormal; Notable for the following components:   Specific Gravity, Urine 1.041 (*)    Ketones, ur 20 (*)    All other components within normal limits  LIPASE, BLOOD  TROPONIN I (HIGH SENSITIVITY)  TROPONIN I (HIGH SENSITIVITY)    EKG: EKG Interpretation Date/Time:  Thursday December 31 2023 04:50:30 EDT Ventricular Rate:  108 PR Interval:  147 QRS Duration:  91 QT Interval:  337 QTC Calculation: 452 R Axis:   106  Text Interpretation: Sinus tachycardia LAE, consider biatrial enlargement Right axis deviation Nonspecific T abnormalities, diffuse leads No significant change since last tracing Confirmed by Haze Lonni PARAS [phone]) on 12/31/2023 6:03:46 AM  Radiology: CT ABDOMEN PELVIS W CONTRAST Result Date: 12/31/2023 CLINICAL DATA:  Acute nonlocalized abdominal pain EXAM: CT ABDOMEN AND PELVIS WITH CONTRAST TECHNIQUE: Multidetector CT imaging of the abdomen and pelvis was performed using the standard protocol following  bolus administration of intravenous contrast. RADIATION DOSE REDUCTION: This exam was performed according to the departmental dose-optimization program which includes automated exposure control, adjustment of the mA and/or kV according to patient size and/or use of iterative reconstruction technique. CONTRAST:  75mL OMNIPAQUE  IOHEXOL  350 MG/ML SOLN COMPARISON:  10/13/2023 FINDINGS: Lower chest: Large hiatal hernia is seen extending into the right cardiophrenic angle, similar prior examination with the gastric fundus located to the right of the cardiac border. Similar prior examination, there is mass effect upon the gastroesophageal junction (9/295/5) by the mid body of the stomach as it passes through the diaphragmatic hiatus and the crura of the diaphragm with at least some degree of obstruction with  layering fluid seen mildly distended distal esophagus. Previously noted herniated portion the transverse colon has returned to the abdominal compartment. Visualized lung bases are clear. Cardiac size within normal limits. Hepatobiliary: No focal liver abnormality is seen. No gallstones, gallbladder wall thickening, or biliary dilatation. Pancreas: Unremarkable Spleen: Unremarkable Adrenals/Urinary Tract: Adrenal glands are unremarkable. Simple renal cyst is seen within the left kidney for which no follow-up imaging is recommended. The kidneys are otherwise unremarkable. Bladder unremarkable. Stomach/Bowel: The small and large bowel are unremarkable there is no evidence of obstruction or focal inflammation. Appendix absent. No free intraperitoneal gas or fluid Vascular/Lymphatic: No significant vascular findings are present. No enlarged abdominal or pelvic lymph nodes. Reproductive: Uterus and bilateral adnexa are unremarkable. Other: No abdominal wall hernia or abnormality. No abdominopelvic ascites. Musculoskeletal: No acute bone abnormality. No lytic or blastic bone lesion. Osseous structures are age appropriate.  IMPRESSION: 1. Large hiatal hernia with mass effect upon the gastroesophageal junction by the mid body of the stomach as it passes through the diaphragmatic hiatus and the crura of the diaphragm with at least some degree of obstruction of the esophagus which appears fluid-filled and mildly distended. This appears new since prior examination of 10/13/2023. 2. Previously noted herniated transverse colon has return to the abdominal compartment. 3. Otherwise no acute intra-abdominal pathology identified. Electronically Signed   By: Dorethia Molt M.D.   On: 12/31/2023 04:27     Procedures   Medications Ordered in the ED  LORazepam  (ATIVAN ) injection 1 mg (has no administration in time range)  ondansetron  (ZOFRAN ) injection 4 mg (4 mg Intravenous Given 12/31/23 0300)  sodium chloride  0.9 % bolus 1,000 mL (0 mLs Intravenous Stopped 12/31/23 0442)  pantoprazole  (PROTONIX ) injection 40 mg (40 mg Intravenous Given 12/31/23 0302)  iohexol  (OMNIPAQUE ) 350 MG/ML injection 75 mL (75 mLs Intravenous Contrast Given 12/31/23 0400)  LORazepam  (ATIVAN ) injection 0.5 mg (0.5 mg Intravenous Given 12/31/23 0455)  fentaNYL  (SUBLIMAZE ) injection 25 mcg (25 mcg Intravenous Given 12/31/23 0456)  sodium chloride  0.9 % bolus 1,000 mL (1,000 mLs Intravenous New Bag/Given 12/31/23 0520)    Clinical Course as of 12/31/23 0611  Thu Dec 31, 2023  0315 Reassessed after medications.  Still complaining of nausea but reports chest burning is decreased. [CG]  [id number] s/p hiatal hernia repair in 2012 with Dr. Marlyse and redo hiatal hernia repair with mesh + G tube + gastropexy in 2014 with Dr. Perri. She reports that on 10/13/23 she went to the ED with solid food intolerance and heartburn and epigastric pain.   Cross-sectional imaging at that time showed large hiatal hernia containing a large portion of mesenteric fat and transverse colon. Surgical evaluation was done at the time and recommended referral to a tertiary Medical Center. She  was discharged at that time since she did not have any obstructive symptoms.  Since then, she has had an upper GI done on June 9 and an EGD with dilation on May 30. She reports that after having both these interventions she has had near resolution of her symptoms. 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 The images are not available for our review.   [CG]  [id number] Dr. Allyne [facility] [CG]    Clinical Course User Index [CG] Ruthell Lonni FALCON, [name]    Medical Decision Making Amount and/or Complexity of Data Reviewed Radiology: ordered.  Risk Prescription drug management.   63 year old female presents for abdominal pain, nausea and vomiting.  Patient arrives tachycardic, afebrile.  Lung  sounds are clear bilaterally, no hypoxia.  Abdomen is tenderness in the epigastric region.  Neurological examination at baseline.  Chart reviewed.  Patient is status post hiatal hernia repair in 2012 with Dr. Adelia.  Patient had redo hiatal hernia repair with mesh, G-tube, gastropexy in 2014 with Dr. Perri.  Patient then returned to ED on 10/13/2023 for food intolerance and heartburn and epigastric pain.  Imaging at that time showed large fetal hernia containing a large portion mesenteric fat and transverse colon.  Surgical evaluation was done and at that time they recommended referral to [facility].  Patient at this evening complaining of nausea, vomiting and epigastric pain since noon today.  She was assessed utilizing CBC, CMP, urinalysis, lipase and troponin x 2.  Patient given a liter of fluid, Zofran  for nausea.  CT scan of her abdomen was also ordered.  Patient CBC without leukocytosis or anemia.  Her metabolic panel is unremarkable without electrolyte derangement, no elevated LFTs, anion gap 12.  Urinalysis positive for ketones.  Lipase 26.  Troponin 3, delta 3.  CT scan of patient abdomen shows a large hiatal hernia with mass effect upon the gastroesophageal  junction by the mid body of the stomach as it passes through the diaphragmatic hiatus and the crura of the diaphragm with at least some degree of obstruction of the esophagus which appears fluid-filled and mildly distended.  This is new since prior examination.  After imaging was obtained I reached out to [facility] general surgery.  I discussed the patient with Dr. Allyne who has accepted the patient in transfer to [facility].  He has requested NG tube be placed.  I have ordered NG tube and this was placed by nursing staff.  I requested CT techs [employer] images to [facility] and they state that this was completed.  Patient placed on maintenance fluids at this time.  Patient will be transferred to [facility] for further care at this time.  She is stable at this time.  Amenable to plan.   Update: Nursing staff attempted to place NG tube.  Patient became uncomfortable and requested NG tube be removed.  Discussed with patient the importance of having a G-tube placed and she has agreed to have this attempted again.  CT scan the patient abdomen shows  Final diagnoses:  Epigastric pain  Nausea and vomiting, unspecified vomiting type  Hiatal hernia with obstruction but no gangrene    ED Discharge Orders     None            Ruthell Lonni FALCON, [name] 12/31/23 [id number]    Ruthell Lonni FALCON, [name] 12/31/23 0654    Haze Lonni PARAS, MD 12/31/23 [phone]

## 2023-12-31 NOTE — ED Notes (Signed)
 Placed NG tube. Pt asked to take it out and requested [facility] to place it.

## 2024-01-01 NOTE — H&P (Signed)
 The surgical history has been reviewed and remains accurate without interval change. The patient was re-examined and patient's physiologic condition has not changed significantly in the last 30 days. The condition still exists that makes this procedure necessary. The treatment plan remains the same, without new options for care. No new pharmacological allergies or types of therapy has been initiated that would change the plan or the appropriateness of the plan. The patient and/or family understand the potential benefits and risks.  To OR for laparoscopic, possible open paraesophageal hernia repair  Electronically signed by:  Schuyler Flor, MD 01/01/2024 4:33 PM

## 2024-01-01 NOTE — H&P (Signed)
 H&P reviewed. The patient was examined and there are no changes to the H&P.  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 Unable to pass a nasogastric tube to decompress the stomach. I spoke to her plan is to go to OR urgently for a Laparoscopic Paraesophageal Hernia repair.  Discussed risk of conversion to open and gastrectomy. She conveyed her understanding.  Wilfred Winfred Raddle, MD

## 2024-01-03 NOTE — Progress Notes (Signed)
 ------------------------------------------------------------------------------- Attestation signed by Wilfred Winfred Raddle., MD at 01/03/2024  4:32 PM I saw and evaluated the patient. I reviewed the trainee's note and agree. I performed the service or was physically present during the critical or key portions of the service furnished by the trainee; and I managed the patient. Tolerating liquids without difficulty. Doing well.  Wilfred Winfred Raddle, MD 01/03/2024  -------------------------------------------------------------------------------  Bariatric/Minimally Invasive Surgery Progress Note   LOS: 3 days  Procedure(s): Procedure(s) (LRB): (E2) REPAIR HERNIA HIATAL  LAPRASCOPIC (N/A) 2 Days Post-Op   Subjective: 24 Hour Events:  POD2 from laparoscopic paraesophageal hernia repair with gastropexy. No acute events overnight, pain controlled with PO pain medications. Did not trial full liquids, has been drinking clear liquids without difficulty, encouraged trialing fulls. Has been ambulatory in the halls  Objective: Vital signs in last 24 hours: Temp:  [96.7 F (35.9 C)-98.4 F (36.9 C)] 96.9 F (36.1 C) Heart Rate:  [71-93] 71 Resp:  [15-19] 17 BP: (146-167)/(82-93) 148/85   O2 Device: None (Room air)     Intake/Output last 3 shifts: I/O last 3 completed shifts: In: 5139.1 [P.O.:1410; I.V.:3479.1; IV Piggyback:250] Out: 2650 [Urine:2650] Intake/Output this shift: I/O this shift: In: 120 [P.O.:120] Out: 100 [Urine:100]  Intake/Output Summary (Last 24 hours) at 01/03/2024 0842 Last data filed at 01/03/2024 9283 Gross per 24 hour  Intake 1875.83 ml  Output 1250 ml  Net 625.83 ml    Physical Exam: General: female, awake and alert, sitting up in bed  HEENT: Normocephalic, atraumatic, moist mucous membranes  Neck:  Supple, trachea midline  Cardiovascular: Regular rate and rhythm, HDS  Respiratory: On room air, no shortness of breath  Gastrointestinal: Abdomen soft,  non-tender, non-distended. Port sites c/d/i.   Musculoskeletal: Moving all 4 extremities spontaneously  Skin: Warm and dry  : GCS 15     Labs: Last 3 results CBC: Recent CBC    12/31/23 2318 01/02/24 0005 01/02/24 2334  WBC 6.80 8.20 6.60  RBC 4.44 3.99* 4.01*  HGB 13.9 12.9 12.3  HCT 42.3 37.0 37.6  MCV 95.3 93.0 93.7  MCHC 32.8* 34.9 32.8*  RDW 14.1 13.5 13.2  PLT 156 140* 143*  MPV 9.6 8.8 9.6    BMP/CMP: Recent CMP    12/31/23 2318 01/01/24 2353 01/02/24 2333  NA 143 140 137  K 4.0 3.8 3.5  CL 107 105 102  CO2 25 24 28   BUN 9 10 12   CREATININE 0.85 0.63 0.63  CALCIUM 9.0 8.2* 8.9  PROT 6.1* 5.7* 5.6*  ALBUMIN 4.1 3.9 3.6  BILITOT 0.6 0.6 0.6  AST 19 35 26  ALT 17 28 20   ANIONGAP 11 11 7    At least one component is invalid ALKPHOS   ABG:     Invalid input(s): O2S  Coags:     Pertinent Labs in the Last 24 Hours: See results tab  Pertinent Diagnostic Studies in the Last 24 Hours: See imaging tab  Assessment/Plan: Principal Problem:   Hiatal hernia Active Problems:   Paraesophageal hernia  68 yoF with 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   Neuro: Glasgow Coma Scale Score: 15  - Monitor neuro exam closely  Pain control - reports adequate on current regimen  Cardiovascular: Hemodynamically stable - continue to monitor  Pulmonary: On RA - Encourage aggressive pulmonary toilet, frequent use of IS, up to 10x/hr   GI: s/p laparoscopic paraesophageal hernia repair - full liquids encouraged, no carbonated beverages  Bowel regimen provided - Miralax scheduled GI PPX and N/V: - Zofran  PRN for N/V  Heme/ID: Hb stable  No acute infectious concerns  DVT ppx: - Lovenox  F/E/N: F: SLIV E: Replete as indicated  N: no carbonated beverages    Dietary Orders  (From admission, onward)               Ordered    Adult Diet- Liquid; Full liquids   Diet effective now       References:    Medical Nutrition Management (MNM) for Registered Dietitian  Question Answer Comment  Diet type: Liquid   Liquid Diet Choices Full liquids   Medical Nutrition Management By RD Yes, Medical Nutrition Management By RD      01/02/24 9095            Endocrine: No acute concerns  Renal: No foley - Monitor UOP  MSK: No acute concerns Routine wound care  Dispo:  Dispo:  anticipate d/c home 8/4  Patient/family informed of plan and agreeable.     Electronically signed by:  Schuyler Flor, MD 01/03/2024 8:42 AM
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
## 2024-01-20 ENCOUNTER — Telehealth (INDEPENDENT_AMBULATORY_CARE_PROVIDER_SITE_OTHER): Admitting: Psychiatry

## 2024-01-20 ENCOUNTER — Encounter: Payer: Self-pay | Admitting: Psychiatry

## 2024-01-20 DIAGNOSIS — F4381 Prolonged grief disorder: Secondary | ICD-10-CM

## 2024-01-20 DIAGNOSIS — F330 Major depressive disorder, recurrent, mild: Secondary | ICD-10-CM

## 2024-01-20 DIAGNOSIS — F419 Anxiety disorder, unspecified: Secondary | ICD-10-CM

## 2024-01-20 MED ORDER — CLONAZEPAM 0.5 MG PO TABS: 0.5000 mg | ORAL_TABLET | Freq: Two times a day (BID) | ORAL | 1 refills | Status: DC | PRN

## 2024-03-11 ENCOUNTER — Encounter: Payer: Self-pay | Admitting: Neurology
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
## 2024-03-14 NOTE — Progress Notes (Unsigned)
 NEUROLOGY FOLLOW UP OFFICE NOTE  Rebecca Barrera [id number]  Assessment/Plan:   Migraine without aura, without status migrainosus, not intractable Cervicalgia   Migraine prevention:  Will defer for now as they are not frequent. Migraine rescue:  Will provide samples of Nurtec to take alone or in addition to rizatriptan.  Promethazine  for nausea Limit use of pain relievers to no more than 2 days out of week to prevent risk of rebound or medication-overuse headache. Keep headache diary Consider physical therapy for neck pain Follow up 6 months.    Subjective:  Rebecca Barrera is a 63 year old right-handed female with HTN, HLD, Bipolar disorder, tremor, depression and anxiety who follows up for migraine.  UPDATE: Migraines: Intensity:  *** Duration:  *** with rizatriptan Nurtec?  *** Frequency:  ***  Neck pain/balance problems: However, her primary concern to day is ***  Current medications: Current NSAIDS/analgesics:  Aleve  (usually for neck) Current triptans:  rizatriptan-MLT 10mg  Current ergotamine:  none Current anti-emetic:  promethazine  25mg  Current muscle relaxants:  none Current Antihypertensive medications:  none Current Antidepressant medications:  venlafaxine  XR 75mg  daily, bupropion  XL 300mg  daily Current Anticonvulsant medications:  none Current anti-CGRP:  none Current Antihistamines/Decongestants:  Flonase  Other medications:  clonazepam    Caffeine :  None Depression:  stable; Anxiety:  stable Other pain:  hip pain, arm pain, TMJ dysfunction.  Recent onset of neck stiffness.  Cervical X-ray on 08/18/2023 showed mild degenerative joint changes at C7-T1.   Sleep hygiene:  good.  Aggravated by neck pain.  Uses orthopedic pillows  HISTORY: Onset:  50-71 years old Location:  bilateral temples, bilateral occipital, bilateral retro-orbital Quality:  squeezing, vice, pressure Intensity:  6-7/10 Aura:  absent Prodrome:  absent Associated symptoms:  Nausea,  photophobia, phonophobia, osmophobia.  She denies associated vomiting, visual disturbance, neck pain, dizziness, unilateral numbness or weakness. Duration:  2 days with rizatriptan Frequency:  1 to 2 times a month. Frequency of abortive medication: 2 to 4 days a month Triggers:  stress, chocolate, caffeine  Relieving factors:  Maxalt Activity:  aggravates  Remote CT head on 08/13/2001 to assess headache was personally reviewed and was normal.  Imaging of TMJs on 01/25/2021 was unremarkable.    Past NSAIDS/analgesics:  Fioricet, naproxen , tramadol Past abortive triptans:  sumatriptan 100mg , sumatriptan 85mg -naproxen  500mg  Past abortive ergotamine:  none Past muscle relaxants:  none Past anti-emetic:  none Past antihypertensive medications:  lisinopril (caused hypotension) Past antidepressant medications:  nortriptyline , sertraline , duloxetine , escitalopram , Trintellix , mirtazapine  Past anticonvulsant medications:  none Past anti-CGRP:  none Past vitamins/Herbal/Supplements:  none Past antihistamines/decongestants:  loratadine, Flonase  Other past therapies:  none   Family history of headache:  mom  PAST MEDICAL HISTORY: Past Medical History:  Diagnosis Date   Acid reflux    Anemia    Anxiety    Depression    Hiatal hernia    High cholesterol    Leg pain    Migraine    Migraines    Panic attacks    Varicose veins     MEDICATIONS: Current Outpatient Medications on File Prior to Visit  Medication Sig Dispense Refill   buPROPion  (WELLBUTRIN  XL) 150 MG 24 hr tablet Take 3 tablets (450 mg total) by mouth daily. 90 tablet 5   clonazePAM  (KLONOPIN ) 0.5 MG tablet Take 1 tablet (0.5 mg total) by mouth 2 (two) times daily as needed for anxiety. 60 tablet 1   ergocalciferol (VITAMIN D2) 1.25 MG (50000 UT) capsule Take 50,000 Units by mouth once a week.  famotidine  (PEPCID ) 40 MG tablet Take 1 tablet (40 mg total) by mouth daily. 30 tablet 0   fluticasone  (FLONASE ) 50 MCG/ACT nasal  spray Place 2 sprays into both nostrils daily. 16 g 0   linaclotide (LINZESS) 72 MCG capsule Take 72 mcg by mouth daily before breakfast.     Multiple Vitamins-Minerals (CENTRUM SILVER 50+WOMEN PO) Take by mouth daily.     pantoprazole  (PROTONIX ) 40 MG tablet Take 40 mg by mouth daily.     pravastatin (PRAVACHOL) 20 MG tablet Take 20 mg by mouth daily.     rizatriptan (MAXALT) 10 MG tablet Take 10 mg by mouth as needed for migraine. May repeat in 2 hours if needed     sucralfate  (CARAFATE ) 1 GM/10ML suspension Take 10 mLs (1 g total) by mouth 4 (four) times daily -  with meals and at bedtime for 10 days. 400 mL 0   venlafaxine  XR (EFFEXOR -XR) 75 MG 24 hr capsule Take 1 capsule (75 mg total) by mouth daily with breakfast. 30 capsule 5   Vonoprazan Fumarate  10 MG TABS Take 10 mg by mouth daily. (Patient not taking: Reported on 10/13/2023) 30 tablet 0   No current facility-administered medications on file prior to visit.    ALLERGIES: Allergies  Allergen Reactions   Aspirin Other (See Comments)    Due to hernia   Hydrocodone -Acetaminophen  Itching   Lorazepam  Other (See Comments)    migranes   Vicodin [Hydrocodone -Acetaminophen ] Other (See Comments)    Pt states it gives her a headache    FAMILY HISTORY: Family History  Problem Relation Age of Onset   Hyperlipidemia Mother    Hypertension Mother    Stroke Mother    Dementia Mother    Migraines Mother    Anxiety disorder Mother    Depression Mother    Heart disease Father    Heart disease Sister    Alcohol abuse Sister    Seizures Brother    Epilepsy Brother    Heart disease Brother    Hyperlipidemia Son    Hypertension Son    Colon cancer Neg Hx       Objective:  *** General: No acute distress.  Patient appears ***-groomed.   Head:  Normocephalic/atraumatic Eyes:  Fundi examined but not visualized Neck: supple, no paraspinal tenderness, full range of motion Heart:  Regular rate and rhythm Neurological Exam: alert and  oriented.  Speech fluent and not dysarthric, language intact.  CN II-XII intact. Bulk and tone normal, muscle strength 5/5 throughout.  Sensation to light touch intact.  Deep tendon reflexes 2+ throughout, toes downgoing.  Finger to nose testing intact.  Gait normal, Romberg negative.   Juliene Dunnings, DO  CC: ***

## 2024-03-15 ENCOUNTER — Encounter: Payer: Self-pay | Admitting: Neurology

## 2024-03-15 ENCOUNTER — Ambulatory Visit: Admitting: Neurology

## 2024-03-15 VITALS — BP 141/88 | HR 80 | Wt 155.0 lb

## 2024-03-15 DIAGNOSIS — G43009 Migraine without aura, not intractable, without status migrainosus: Secondary | ICD-10-CM | POA: Diagnosis not present

## 2024-03-15 DIAGNOSIS — M542 Cervicalgia: Secondary | ICD-10-CM | POA: Diagnosis not present

## 2024-03-15 DIAGNOSIS — R2681 Unsteadiness on feet: Secondary | ICD-10-CM | POA: Diagnosis not present

## 2024-03-15 NOTE — Patient Instructions (Signed)
 Refer to physical therapy for: Neck pain Gait instability  Follow up 6 months.

## 2024-03-16 ENCOUNTER — Telehealth (INDEPENDENT_AMBULATORY_CARE_PROVIDER_SITE_OTHER): Admitting: Psychiatry

## 2024-03-16 ENCOUNTER — Encounter (INDEPENDENT_AMBULATORY_CARE_PROVIDER_SITE_OTHER): Payer: Self-pay | Admitting: Gastroenterology

## 2024-03-16 ENCOUNTER — Encounter: Payer: Self-pay | Admitting: Psychiatry

## 2024-03-16 DIAGNOSIS — F419 Anxiety disorder, unspecified: Secondary | ICD-10-CM

## 2024-03-16 DIAGNOSIS — F4381 Prolonged grief disorder: Secondary | ICD-10-CM | POA: Diagnosis not present

## 2024-03-16 DIAGNOSIS — F330 Major depressive disorder, recurrent, mild: Secondary | ICD-10-CM | POA: Diagnosis not present

## 2024-03-16 NOTE — Patient Instructions (Signed)
 Continue venlafaxine  75 mg daily  Decrease bupropion  300 mg daily for two weeks Continue clonazepam  0.5 mg twice a day for anxiety  Next appointment: 12/17 at 4 pm

## 2024-04-04 ENCOUNTER — Other Ambulatory Visit: Payer: Self-pay | Admitting: Psychiatry

## 2024-04-15 ENCOUNTER — Ambulatory Visit (HOSPITAL_COMMUNITY)

## 2024-04-22 ENCOUNTER — Telehealth: Payer: Self-pay

## 2024-04-22 NOTE — Telephone Encounter (Signed)
 Patient called stating that she is currently prescribed Klonopin  0.5 mg twice a day she stated that she is under a lot of stress and would like to increase her dose to three times a day for the next couple of months I explained to the patient that her provider was out the office and the covering provider may not approve of this request she voiced understanding

## 2024-04-22 NOTE — Telephone Encounter (Signed)
 Will defer this to Dr.Hisada . Please have her schedule a sooner visit or place on waitlist for sooner if needed.

## 2024-04-22 NOTE — Telephone Encounter (Signed)
 Yes, a virtual visit will work well.  Please advise her to stay on her current clonazepam  dose and not increase it for now, so I can better understand how to support her.

## 2024-04-25 ENCOUNTER — Other Ambulatory Visit: Payer: Self-pay | Admitting: Psychiatry

## 2024-04-27 ENCOUNTER — Telehealth (INDEPENDENT_AMBULATORY_CARE_PROVIDER_SITE_OTHER): Admitting: Psychiatry

## 2024-04-27 ENCOUNTER — Encounter: Payer: Self-pay | Admitting: Psychiatry

## 2024-04-27 DIAGNOSIS — F4381 Prolonged grief disorder: Secondary | ICD-10-CM

## 2024-04-27 DIAGNOSIS — F419 Anxiety disorder, unspecified: Secondary | ICD-10-CM

## 2024-04-27 DIAGNOSIS — F330 Major depressive disorder, recurrent, mild: Secondary | ICD-10-CM | POA: Diagnosis not present

## 2024-04-27 MED ORDER — CLONAZEPAM 0.5 MG PO TABS: 0.5000 mg | ORAL_TABLET | Freq: Two times a day (BID) | ORAL | 0 refills | Status: DC | PRN
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
## 2024-04-27 NOTE — Patient Instructions (Signed)
 Continue venlafaxine  75 mg daily Continue bupropion  300 mg daily for two weeks Continue clonazepam  0.5 mg twice a day for anxiety  Next appointment: 12/17 at 4 pm

## 2024-05-13 ENCOUNTER — Ambulatory Visit (HOSPITAL_COMMUNITY): Admitting: Psychiatry
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
## 2024-05-18 ENCOUNTER — Encounter: Payer: Self-pay | Admitting: Psychiatry

## 2024-05-18 ENCOUNTER — Telehealth: Admitting: Psychiatry

## 2024-05-18 DIAGNOSIS — F4381 Prolonged grief disorder: Secondary | ICD-10-CM | POA: Diagnosis not present

## 2024-05-18 DIAGNOSIS — F3341 Major depressive disorder, recurrent, in partial remission: Secondary | ICD-10-CM | POA: Diagnosis not present

## 2024-05-18 DIAGNOSIS — F419 Anxiety disorder, unspecified: Secondary | ICD-10-CM | POA: Diagnosis not present

## 2024-05-18 MED ORDER — BUPROPION HCL ER (XL) 300 MG PO TB24: 300.0000 mg | ORAL_TABLET | Freq: Every day | ORAL | 5 refills | Status: AC

## 2024-05-18 MED ORDER — CLONAZEPAM 0.5 MG PO TABS: 0.5000 mg | ORAL_TABLET | Freq: Two times a day (BID) | ORAL | 1 refills | Status: AC | PRN

## 2024-05-18 NOTE — Patient Instructions (Signed)
 Continue venlafaxine  75 mg daily  Continue bupropion  300 mg daily  Continue clonazepam  0.5 mg twice a day for anxiety  Next appointment: 2/11 at 4 pm

## 2024-05-19 ENCOUNTER — Ambulatory Visit (HOSPITAL_COMMUNITY): Admitting: Psychiatry

## 2024-06-22 ENCOUNTER — Encounter: Payer: Self-pay | Admitting: Psychiatry

## 2024-06-22 ENCOUNTER — Telehealth: Admitting: Psychiatry

## 2024-06-22 DIAGNOSIS — F419 Anxiety disorder, unspecified: Secondary | ICD-10-CM | POA: Diagnosis not present

## 2024-06-22 DIAGNOSIS — F4381 Prolonged grief disorder: Secondary | ICD-10-CM

## 2024-06-22 DIAGNOSIS — F3341 Major depressive disorder, recurrent, in partial remission: Secondary | ICD-10-CM | POA: Diagnosis not present
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
## 2024-06-22 NOTE — Patient Instructions (Signed)
 Continue venlafaxine  75 mg daily  Continue bupropion  300 mg daily  Continue clonazepam  0.5 mg twice a day for anxiety  Next appointment: 2/11 at 4 pm

## 2024-07-04 ENCOUNTER — Encounter: Payer: Self-pay | Admitting: Neurology

## 2024-07-07 NOTE — Telephone Encounter (Signed)
 Medication Samples have been provided to the patient.  Drug name: [name]       Strength: 100 mg        Qty: 4  LOT: [id number]  Exp.Date: 4/28  Dosing instructions: as neeeded  The patient has been instructed regarding the correct time, dose, and frequency of taking this medication, including desired effects and most common side effects.   Rebecca Barrera 12:35 PM 07/07/2024

## 2024-07-08 ENCOUNTER — Encounter (HOSPITAL_COMMUNITY): Payer: Self-pay

## 2024-07-08 ENCOUNTER — Emergency Department (HOSPITAL_COMMUNITY): Admission: EM | Admit: 2024-07-08 | Source: Home / Self Care

## 2024-07-08 ENCOUNTER — Other Ambulatory Visit: Payer: Self-pay

## 2024-07-08 NOTE — ED Triage Notes (Signed)
 [facility]cems from home cc of abdominal pain since last night  Left ro right under belly button worse today.  + nausea - emesis/ diarrhea  Hx of SBO , hernias  Has been taking zofran 

## 2024-07-13 ENCOUNTER — Telehealth: Admitting: Psychiatry

## 2024-10-04 ENCOUNTER — Ambulatory Visit: Admitting: Neurology
